# Patient Record
Sex: Female | Born: 1955
Health system: Southern US, Community
[De-identification: ages and names within clinical notes are randomized; demographics above are authoritative.]

## PROBLEM LIST (undated history)

## (undated) DIAGNOSIS — M545 Low back pain, unspecified: Secondary | ICD-10-CM

## (undated) DIAGNOSIS — F419 Anxiety disorder, unspecified: Secondary | ICD-10-CM

## (undated) DIAGNOSIS — R51 Headache: Secondary | ICD-10-CM

## (undated) DIAGNOSIS — E039 Hypothyroidism, unspecified: Secondary | ICD-10-CM

## (undated) DIAGNOSIS — R002 Palpitations: Secondary | ICD-10-CM

## (undated) DIAGNOSIS — E663 Overweight: Secondary | ICD-10-CM

## (undated) DIAGNOSIS — E7800 Pure hypercholesterolemia, unspecified: Secondary | ICD-10-CM

## (undated) DIAGNOSIS — E0590 Thyrotoxicosis, unspecified without thyrotoxic crisis or storm: Secondary | ICD-10-CM

## (undated) DIAGNOSIS — D539 Nutritional anemia, unspecified: Secondary | ICD-10-CM

## (undated) DIAGNOSIS — R911 Solitary pulmonary nodule: Secondary | ICD-10-CM

## (undated) HISTORY — DX: Nutritional anemia, unspecified: D53.9

## (undated) HISTORY — DX: Overweight: E66.3

## (undated) HISTORY — DX: Palpitations: R00.2

## (undated) HISTORY — DX: Pure hypercholesterolemia, unspecified: E78.00

## (undated) HISTORY — DX: Solitary pulmonary nodule: R91.1

## (undated) HISTORY — DX: Headache: R51

## (undated) HISTORY — DX: Hypothyroidism, unspecified: E03.9

## (undated) HISTORY — DX: Thyrotoxicosis, unspecified without thyrotoxic crisis or storm: E05.90

## (undated) HISTORY — DX: Low back pain, unspecified: M54.50

## (undated) HISTORY — DX: Anxiety disorder, unspecified: F41.9

## (undated) HISTORY — DX: Low back pain: M54.5

---
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
## 1999-11-04 ENCOUNTER — Other Ambulatory Visit: Admission: RE | Admit: 1999-11-04 | Discharge: 1999-11-04 | Payer: Self-pay | Admitting: Family Medicine

## 1999-11-29 ENCOUNTER — Encounter: Admission: RE | Admit: 1999-11-29 | Discharge: 2000-02-27 | Payer: Self-pay | Admitting: Family Medicine

## 2001-11-30 ENCOUNTER — Other Ambulatory Visit: Admission: RE | Admit: 2001-11-30 | Discharge: 2001-11-30 | Payer: Self-pay | Admitting: Obstetrics and Gynecology

## 2003-01-27 ENCOUNTER — Other Ambulatory Visit: Admission: RE | Admit: 2003-01-27 | Discharge: 2003-01-27 | Payer: Self-pay | Admitting: Obstetrics and Gynecology

## 2004-02-06 ENCOUNTER — Other Ambulatory Visit: Admission: RE | Admit: 2004-02-06 | Discharge: 2004-02-06 | Payer: Self-pay | Admitting: Obstetrics and Gynecology

## 2004-08-24 ENCOUNTER — Ambulatory Visit: Payer: Self-pay | Admitting: Pulmonary Disease

## 2005-04-08 ENCOUNTER — Ambulatory Visit: Payer: Self-pay | Admitting: Pulmonary Disease

## 2005-04-15 ENCOUNTER — Ambulatory Visit: Payer: Self-pay | Admitting: Pulmonary Disease

## 2005-04-21 ENCOUNTER — Ambulatory Visit: Payer: Self-pay | Admitting: Endocrinology

## 2005-05-02 ENCOUNTER — Encounter (HOSPITAL_COMMUNITY): Admission: RE | Admit: 2005-05-02 | Discharge: 2005-07-26 | Payer: Self-pay | Admitting: Endocrinology

## 2005-07-06 ENCOUNTER — Ambulatory Visit: Payer: Self-pay | Admitting: Endocrinology

## 2005-08-09 ENCOUNTER — Other Ambulatory Visit: Admission: RE | Admit: 2005-08-09 | Discharge: 2005-08-09 | Payer: Self-pay | Admitting: Obstetrics and Gynecology

## 2005-08-11 ENCOUNTER — Ambulatory Visit: Payer: Self-pay | Admitting: Pulmonary Disease

## 2005-10-14 ENCOUNTER — Ambulatory Visit: Payer: Self-pay | Admitting: Endocrinology

## 2006-02-14 ENCOUNTER — Ambulatory Visit: Payer: Self-pay | Admitting: Endocrinology

## 2006-08-21 ENCOUNTER — Ambulatory Visit: Payer: Self-pay | Admitting: Pulmonary Disease

## 2006-08-21 LAB — CONVERTED CEMR LAB
ALT: 21 units/L (ref 0–40)
AST: 25 units/L (ref 0–37)
Albumin: 3.2 g/dL — ABNORMAL LOW (ref 3.5–5.2)
Alkaline Phosphatase: 77 units/L (ref 39–117)
BUN: 8 mg/dL (ref 6–23)
Basophils Absolute: 0.1 10*3/uL (ref 0.0–0.1)
Basophils Relative: 1.3 % — ABNORMAL HIGH (ref 0.0–1.0)
CO2: 27 meq/L (ref 19–32)
Calcium: 9.3 mg/dL (ref 8.4–10.5)
Chloride: 105 meq/L (ref 96–112)
Cholesterol: 157 mg/dL (ref 0–200)
Creatinine, Ser: 0.8 mg/dL (ref 0.4–1.2)
Eosinophils Relative: 1.8 % (ref 0.0–5.0)
Free T4: 1.5 ng/dL (ref 0.6–1.6)
GFR calc Af Amer: 98 mL/min
GFR calc non Af Amer: 81 mL/min
Glucose, Bld: 121 mg/dL — ABNORMAL HIGH (ref 70–99)
HCT: 39.4 % (ref 36.0–46.0)
HDL: 41.8 mg/dL (ref >39.0)
Hemoglobin: 13.5 g/dL (ref 12.0–15.0)
LDL Cholesterol: 87 mg/dL (ref 0–99)
Lymphocytes Relative: 24.7 % (ref 12.0–46.0)
MCHC: 34.2 g/dL (ref 30.0–36.0)
MCV: 80.8 fL (ref 78.0–100.0)
Monocytes Absolute: 0.3 10*3/uL (ref 0.2–0.7)
Monocytes Relative: 5.6 % (ref 3.0–11.0)
Neutro Abs: 4.2 10*3/uL (ref 1.4–7.7)
Neutrophils Relative %: 66.6 % (ref 43.0–77.0)
Platelets: 301 10*3/uL (ref 150–400)
Potassium: 4.9 meq/L (ref 3.5–5.1)
RBC: 4.87 M/uL (ref 3.87–5.11)
RDW: 12.3 % (ref 11.5–14.6)
Sodium: 138 meq/L (ref 135–145)
T3, Free: 6.7 pg/mL — ABNORMAL HIGH (ref 2.3–4.2)
TSH: 0.19 microintl units/mL — ABNORMAL LOW (ref 0.35–5.50)
Total Bilirubin: 0.5 mg/dL (ref 0.3–1.2)
Total CHOL/HDL Ratio: 3.8
Total Protein: 7.1 g/dL (ref 6.0–8.3)
Triglycerides: 142 mg/dL (ref 0–149)
VLDL: 28 mg/dL (ref 0–40)
WBC: 6.2 10*3/uL (ref 4.5–10.5)

## 2007-03-22 ENCOUNTER — Encounter: Payer: Self-pay | Admitting: *Deleted

## 2007-03-23 ENCOUNTER — Ambulatory Visit: Payer: Self-pay | Admitting: Pulmonary Disease

## 2007-10-16 ENCOUNTER — Ambulatory Visit: Payer: Self-pay | Admitting: Endocrinology

## 2007-10-16 LAB — CONVERTED CEMR LAB
Free T4: 2.3 ng/dL — ABNORMAL HIGH (ref 0.6–1.6)
TSH: 0.02 microintl units/mL — ABNORMAL LOW (ref 0.35–5.50)

## 2007-10-29 ENCOUNTER — Encounter: Admission: RE | Admit: 2007-10-29 | Discharge: 2007-10-29 | Payer: Self-pay | Admitting: Endocrinology

## 2007-10-31 HISTORY — PX: OTHER SURGICAL HISTORY: SHX169

## 2007-11-01 ENCOUNTER — Telehealth: Payer: Self-pay | Admitting: Endocrinology

## 2007-11-16 ENCOUNTER — Encounter: Admission: RE | Admit: 2007-11-16 | Discharge: 2007-11-16 | Payer: Self-pay | Admitting: Endocrinology

## 2007-12-11 ENCOUNTER — Telehealth (INDEPENDENT_AMBULATORY_CARE_PROVIDER_SITE_OTHER): Payer: Self-pay | Admitting: *Deleted

## 2007-12-13 ENCOUNTER — Ambulatory Visit: Payer: Self-pay | Admitting: Pulmonary Disease

## 2007-12-13 DIAGNOSIS — R002 Palpitations: Secondary | ICD-10-CM | POA: Insufficient documentation

## 2007-12-13 DIAGNOSIS — F411 Generalized anxiety disorder: Secondary | ICD-10-CM | POA: Insufficient documentation

## 2007-12-13 DIAGNOSIS — M545 Low back pain, unspecified: Secondary | ICD-10-CM | POA: Insufficient documentation

## 2007-12-13 DIAGNOSIS — R51 Headache: Secondary | ICD-10-CM | POA: Insufficient documentation

## 2007-12-13 DIAGNOSIS — R519 Headache, unspecified: Secondary | ICD-10-CM | POA: Insufficient documentation

## 2007-12-13 DIAGNOSIS — E7800 Pure hypercholesterolemia, unspecified: Secondary | ICD-10-CM | POA: Insufficient documentation

## 2007-12-13 DIAGNOSIS — T7840XA Allergy, unspecified, initial encounter: Secondary | ICD-10-CM | POA: Insufficient documentation

## 2007-12-19 ENCOUNTER — Ambulatory Visit: Payer: Self-pay | Admitting: Endocrinology

## 2007-12-19 LAB — CONVERTED CEMR LAB
Free T4: 1.7 ng/dL — ABNORMAL HIGH (ref 0.6–1.6)
TSH: 0.02 microintl units/mL — ABNORMAL LOW (ref 0.35–5.50)

## 2008-03-10 ENCOUNTER — Ambulatory Visit: Payer: Self-pay | Admitting: Endocrinology

## 2008-03-10 DIAGNOSIS — E890 Postprocedural hypothyroidism: Secondary | ICD-10-CM | POA: Insufficient documentation

## 2008-03-10 LAB — CONVERTED CEMR LAB
Free T4: 0.2 ng/dL — ABNORMAL LOW (ref 0.6–1.6)
TSH: 38.6 microintl units/mL — ABNORMAL HIGH (ref 0.35–5.50)

## 2008-04-14 ENCOUNTER — Telehealth (INDEPENDENT_AMBULATORY_CARE_PROVIDER_SITE_OTHER): Payer: Self-pay | Admitting: *Deleted

## 2008-04-15 ENCOUNTER — Ambulatory Visit: Payer: Self-pay | Admitting: Pulmonary Disease

## 2008-04-18 ENCOUNTER — Ambulatory Visit (HOSPITAL_COMMUNITY): Admission: RE | Admit: 2008-04-18 | Discharge: 2008-04-18 | Payer: Self-pay | Admitting: Pulmonary Disease

## 2008-04-21 LAB — CONVERTED CEMR LAB
ALT: 60 units/L — ABNORMAL HIGH (ref 0–35)
AST: 43 units/L — ABNORMAL HIGH (ref 0–37)
Albumin: 3.7 g/dL (ref 3.5–5.2)
Alkaline Phosphatase: 71 units/L (ref 39–117)
Amylase: 43 units/L (ref 27–131)
BUN: 7 mg/dL (ref 6–23)
Basophils Absolute: 0.1 10*3/uL (ref 0.0–0.1)
Basophils Relative: 1.4 % (ref 0.0–3.0)
Bilirubin, Direct: 0.2 mg/dL (ref 0.0–0.3)
CO2: 26 meq/L (ref 19–32)
Calcium: 8.8 mg/dL (ref 8.4–10.5)
Chloride: 112 meq/L (ref 96–112)
Creatinine, Ser: 0.8 mg/dL (ref 0.4–1.2)
Eosinophils Absolute: 0.1 10*3/uL (ref 0.0–0.7)
Eosinophils Relative: 1.8 % (ref 0.0–5.0)
GFR calc Af Amer: 97 mL/min
GFR calc non Af Amer: 80 mL/min
Glucose, Bld: 102 mg/dL — ABNORMAL HIGH (ref 70–99)
HCT: 36.2 % (ref 36.0–46.0)
Hemoglobin: 12.5 g/dL (ref 12.0–15.0)
Lipase: 14 units/L (ref 11.0–59.0)
Lymphocytes Relative: 22 % (ref 12.0–46.0)
MCHC: 34.5 g/dL (ref 30.0–36.0)
MCV: 89.9 fL (ref 78.0–100.0)
Monocytes Absolute: 0.3 10*3/uL (ref 0.1–1.0)
Monocytes Relative: 5.2 % (ref 3.0–12.0)
Neutro Abs: 3.9 10*3/uL (ref 1.4–7.7)
Neutrophils Relative %: 69.6 % (ref 43.0–77.0)
Platelets: 279 10*3/uL (ref 150–400)
Potassium: 4.4 meq/L (ref 3.5–5.1)
RBC: 4.02 M/uL (ref 3.87–5.11)
RDW: 13.4 % (ref 11.5–14.6)
Sed Rate: 49 mm/hr — ABNORMAL HIGH (ref 0–22)
Sodium: 140 meq/L (ref 135–145)
Total Bilirubin: 0.6 mg/dL (ref 0.3–1.2)
Total Protein: 7.6 g/dL (ref 6.0–8.3)
WBC: 5.7 10*3/uL (ref 4.5–10.5)

## 2008-04-22 ENCOUNTER — Encounter: Payer: Self-pay | Admitting: Pulmonary Disease

## 2008-04-23 ENCOUNTER — Telehealth (INDEPENDENT_AMBULATORY_CARE_PROVIDER_SITE_OTHER): Payer: Self-pay | Admitting: *Deleted

## 2008-05-21 ENCOUNTER — Encounter: Payer: Self-pay | Admitting: Pulmonary Disease

## 2008-05-27 ENCOUNTER — Ambulatory Visit: Payer: Self-pay | Admitting: Endocrinology

## 2008-05-27 LAB — CONVERTED CEMR LAB: TSH: 9.66 microintl units/mL — ABNORMAL HIGH (ref 0.35–5.50)

## 2008-07-22 ENCOUNTER — Telehealth (INDEPENDENT_AMBULATORY_CARE_PROVIDER_SITE_OTHER): Payer: Self-pay | Admitting: *Deleted

## 2008-07-23 ENCOUNTER — Ambulatory Visit: Payer: Self-pay | Admitting: Pulmonary Disease

## 2008-08-01 HISTORY — PX: CHOLECYSTECTOMY, LAPAROSCOPIC: SHX56

## 2008-09-19 ENCOUNTER — Ambulatory Visit: Payer: Self-pay | Admitting: Pulmonary Disease

## 2008-09-20 DIAGNOSIS — E0590 Thyrotoxicosis, unspecified without thyrotoxic crisis or storm: Secondary | ICD-10-CM | POA: Insufficient documentation

## 2008-09-22 ENCOUNTER — Telehealth (INDEPENDENT_AMBULATORY_CARE_PROVIDER_SITE_OTHER): Payer: Self-pay | Admitting: *Deleted

## 2008-11-18 LAB — CONVERTED CEMR LAB: Vit D, 25-Hydroxy: 15 ng/mL — ABNORMAL LOW (ref 30–89)

## 2008-12-08 ENCOUNTER — Telehealth: Payer: Self-pay | Admitting: Pulmonary Disease

## 2008-12-19 ENCOUNTER — Telehealth: Payer: Self-pay | Admitting: Pulmonary Disease

## 2009-02-19 ENCOUNTER — Ambulatory Visit: Payer: Self-pay | Admitting: Pulmonary Disease

## 2009-02-19 DIAGNOSIS — D649 Anemia, unspecified: Secondary | ICD-10-CM | POA: Insufficient documentation

## 2009-02-21 DIAGNOSIS — E559 Vitamin D deficiency, unspecified: Secondary | ICD-10-CM | POA: Insufficient documentation

## 2009-02-21 LAB — CONVERTED CEMR LAB
BUN: 10 mg/dL (ref 6–23)
Basophils Absolute: 0.1 10*3/uL (ref 0.0–0.1)
Basophils Relative: 0.9 % (ref 0.0–3.0)
CO2: 30 meq/L (ref 19–32)
Calcium: 8.9 mg/dL (ref 8.4–10.5)
Chloride: 108 meq/L (ref 96–112)
Creatinine, Ser: 0.9 mg/dL (ref 0.4–1.2)
Eosinophils Absolute: 0.1 10*3/uL (ref 0.0–0.7)
Eosinophils Relative: 2.3 % (ref 0.0–5.0)
GFR calc non Af Amer: 69.81 mL/min (ref >60)
Glucose, Bld: 98 mg/dL (ref 70–99)
HCT: 36.4 % (ref 36.0–46.0)
Hemoglobin: 12.4 g/dL (ref 12.0–15.0)
Iron: 54 ug/dL (ref 42–145)
Lymphocytes Relative: 25.4 % (ref 12.0–46.0)
Lymphs Abs: 1.5 10*3/uL (ref 0.7–4.0)
MCHC: 34.1 g/dL (ref 30.0–36.0)
MCV: 88 fL (ref 78.0–100.0)
Monocytes Absolute: 0.3 10*3/uL (ref 0.1–1.0)
Monocytes Relative: 5.2 % (ref 3.0–12.0)
Neutro Abs: 4 10*3/uL (ref 1.4–7.7)
Neutrophils Relative %: 66.2 % (ref 43.0–77.0)
Platelets: 247 10*3/uL (ref 150.0–400.0)
Potassium: 4.4 meq/L (ref 3.5–5.1)
RBC: 4.14 M/uL (ref 3.87–5.11)
RDW: 13.6 % (ref 11.5–14.6)
Saturation Ratios: 10.9 % — ABNORMAL LOW (ref 20.0–50.0)
Sodium: 143 meq/L (ref 135–145)
TSH: 12 microintl units/mL — ABNORMAL HIGH (ref 0.35–5.50)
Transferrin: 353.4 mg/dL (ref 212.0–360.0)
WBC: 6 10*3/uL (ref 4.5–10.5)

## 2009-04-17 ENCOUNTER — Telehealth: Payer: Self-pay | Admitting: Pulmonary Disease

## 2009-04-17 ENCOUNTER — Telehealth (INDEPENDENT_AMBULATORY_CARE_PROVIDER_SITE_OTHER): Payer: Self-pay | Admitting: *Deleted

## 2010-01-13 ENCOUNTER — Telehealth: Payer: Self-pay | Admitting: Pulmonary Disease

## 2010-03-11 ENCOUNTER — Encounter (INDEPENDENT_AMBULATORY_CARE_PROVIDER_SITE_OTHER): Payer: Self-pay | Admitting: *Deleted

## 2010-03-12 ENCOUNTER — Ambulatory Visit: Payer: Self-pay | Admitting: Internal Medicine

## 2010-03-24 ENCOUNTER — Ambulatory Visit: Payer: Self-pay | Admitting: Internal Medicine

## 2010-04-08 ENCOUNTER — Ambulatory Visit: Payer: Self-pay | Admitting: Pulmonary Disease

## 2010-04-08 ENCOUNTER — Encounter: Payer: Self-pay | Admitting: Pulmonary Disease

## 2010-04-11 LAB — CONVERTED CEMR LAB
AST: 23 units/L (ref 0–37)
Albumin: 3.9 g/dL (ref 3.5–5.2)
Alkaline Phosphatase: 87 units/L (ref 39–117)
BUN: 10 mg/dL (ref 6–23)
Basophils Absolute: 0 10*3/uL (ref 0.0–0.1)
Basophils Relative: 0.5 % (ref 0.0–3.0)
Bilirubin, Direct: 0.2 mg/dL (ref 0.0–0.3)
CO2: 29 meq/L (ref 19–32)
Calcium: 9.2 mg/dL (ref 8.4–10.5)
Creatinine, Ser: 0.7 mg/dL (ref 0.4–1.2)
Direct LDL: 134.2 mg/dL
Eosinophils Relative: 4.4 % (ref 0.0–5.0)
GFR calc non Af Amer: 88.5 mL/min (ref >60)
Glucose, Bld: 93 mg/dL (ref 70–99)
HCT: 38.1 % (ref 36.0–46.0)
Hemoglobin: 12.9 g/dL (ref 12.0–15.0)
Leukocytes, UA: NEGATIVE
Lymphs Abs: 1.6 10*3/uL (ref 0.7–4.0)
MCHC: 33.9 g/dL (ref 30.0–36.0)
Monocytes Absolute: 0.3 10*3/uL (ref 0.1–1.0)
Monocytes Relative: 5.6 % (ref 3.0–12.0)
Neutro Abs: 3.2 10*3/uL (ref 1.4–7.7)
Neutrophils Relative %: 59.4 % (ref 43.0–77.0)
Nitrite: NEGATIVE
Platelets: 226 10*3/uL (ref 150.0–400.0)
Potassium: 4.9 meq/L (ref 3.5–5.1)
RBC: 4.2 M/uL (ref 3.87–5.11)
RDW: 13.8 % (ref 11.5–14.6)
Sodium: 141 meq/L (ref 135–145)
Specific Gravity, Urine: <=1.005 (ref 1.000–1.030)
TSH: 20.65 microintl units/mL — ABNORMAL HIGH (ref 0.35–5.50)
Total Protein, Urine: NEGATIVE mg/dL
Total Protein: 7.2 g/dL (ref 6.0–8.3)
Triglycerides: 108 mg/dL (ref 0.0–149.0)
Urobilinogen, UA: 0.2 (ref 0.0–1.0)
VLDL: 21.6 mg/dL (ref 0.0–40.0)
Vit D, 25-Hydroxy: 33 ng/mL (ref 30–89)
WBC: 5.4 10*3/uL (ref 4.5–10.5)
pH: 6.5 (ref 5.0–8.0)

## 2010-08-21 ENCOUNTER — Encounter: Payer: Self-pay | Admitting: Endocrinology

## 2010-08-29 LAB — CONVERTED CEMR LAB
ALT: 19 units/L (ref 0–35)
AST: 20 units/L (ref 0–37)
Albumin: 3.6 g/dL (ref 3.5–5.2)
Alkaline Phosphatase: 82 units/L (ref 39–117)
BUN: 11 mg/dL (ref 6–23)
Basophils Absolute: 0 10*3/uL (ref 0.0–0.1)
Basophils Relative: 0.7 % (ref 0.0–3.0)
Bilirubin Urine: NEGATIVE
Bilirubin, Direct: 0.1 mg/dL (ref 0.0–0.3)
CO2: 28 meq/L (ref 19–32)
Calcium: 9.2 mg/dL (ref 8.4–10.5)
Chloride: 104 meq/L (ref 96–112)
Cholesterol: 185 mg/dL (ref 0–200)
Creatinine, Ser: 0.8 mg/dL (ref 0.4–1.2)
Crystals: NEGATIVE
Eosinophils Absolute: 0.1 10*3/uL (ref 0.0–0.7)
Eosinophils Relative: 1.3 % (ref 0.0–5.0)
GFR calc Af Amer: 97 mL/min
GFR calc non Af Amer: 80 mL/min
Glucose, Bld: 105 mg/dL — ABNORMAL HIGH (ref 70–99)
HCT: 35.2 % — ABNORMAL LOW (ref 36.0–46.0)
HDL: 54.4 mg/dL (ref >39.0)
Hemoglobin: 11.9 g/dL — ABNORMAL LOW (ref 12.0–15.0)
Hgb A1c MFr Bld: 6 % (ref 4.6–6.0)
Ketones, ur: NEGATIVE mg/dL
LDL Cholesterol: 108 mg/dL — ABNORMAL HIGH (ref 0–99)
Leukocytes, UA: NEGATIVE
Lymphocytes Relative: 21 % (ref 12.0–46.0)
MCHC: 33.7 g/dL (ref 30.0–36.0)
MCV: 85.2 fL (ref 78.0–100.0)
Monocytes Absolute: 0.3 10*3/uL (ref 0.1–1.0)
Monocytes Relative: 4.2 % (ref 3.0–12.0)
Mucus, UA: NEGATIVE
Neutro Abs: 5 10*3/uL (ref 1.4–7.7)
Neutrophils Relative %: 72.8 % (ref 43.0–77.0)
Nitrite: NEGATIVE
Platelets: 252 10*3/uL (ref 150–400)
Potassium: 4.3 meq/L (ref 3.5–5.1)
RBC: 4.13 M/uL (ref 3.87–5.11)
RDW: 13 % (ref 11.5–14.6)
Sodium: 139 meq/L (ref 135–145)
Specific Gravity, Urine: 1.02 (ref 1.000–1.03)
TSH: 2.38 microintl units/mL (ref 0.35–5.50)
Total Bilirubin: 0.6 mg/dL (ref 0.3–1.2)
Total CHOL/HDL Ratio: 3.4
Total Protein, Urine: NEGATIVE mg/dL
Total Protein: 7.1 g/dL (ref 6.0–8.3)
Triglycerides: 115 mg/dL (ref 0–149)
Urine Glucose: NEGATIVE mg/dL
Urobilinogen, UA: 0.2 (ref 0.0–1.0)
VLDL: 23 mg/dL (ref 0–40)
WBC, UA: NONE SEEN cells/hpf
WBC: 6.8 10*3/uL (ref 4.5–10.5)
pH: 6 (ref 5.0–8.0)

## 2010-08-31 NOTE — Procedures (Signed)
Summary: Colonoscopy  Patient: Debra Watkins Note: All result statuses are Final unless otherwise noted.  Tests: (1) Colonoscopy (COL)   COL Colonoscopy           DONE     [facility]     [address]     [place], [place]  [address]           COLONOSCOPY PROCEDURE REPORT           PATIENT:  Debra, Watkins  MR#:  [id number]     BIRTHDATE:  29-Sep-1955, 54 yrs. old  GENDER:  female     ENDOSCOPIST:  Wilhemina Bonito. Eda Keys, MD     REF. BY:  Alroy Dust, M.D.     PROCEDURE DATE:  03/24/2010     PROCEDURE:  Average-risk screening colonoscopy     G0121     ASA CLASS:  Class II     INDICATIONS:  Routine Risk Screening     MEDICATIONS:   Fentanyl 50 mcg IV, Versed 7 mg IV, Benadryl 25 mg     IV           DESCRIPTION OF PROCEDURE:   After the risks benefits and     alternatives of the procedure were thoroughly explained, informed     consent was obtained.  Digital rectal exam was performed and     revealed no abnormalities.   The [id number] endoscope was     introduced through the anus and advanced to the cecum, which was     identified by both the appendix and ileocecal valve, without     limitations.Time to cecum = 2:29 min.  The quality of the prep was     excellent, using MoviPrep.  The instrument was then slowly     withdrawn (time = 10:33 min) as the colon was fully examined.     <<PROCEDUREIMAGES>>           FINDINGS:  A normal appearing cecum, ileocecal valve, and     appendiceal orifice were identified. The ascending, hepatic     flexure, transverse, splenic flexure, descending, sigmoid colon,     and rectum appeared unremarkable.  No polyps or cancers were seen.     Retroflexed views in the rectum revealed no abnormalities.    The     scope was then withdrawn from the patient and the procedure     completed.           COMPLICATIONS:  None           ENDOSCOPIC IMPRESSION:     1) Normal colon     2) No polyps or cancers           RECOMMENDATIONS:     1)  Continue current colorectal screening recommendations for     "routine risk" patients with a repeat colonoscopy in 10 years.           ______________________________     Wilhemina Bonito. Eda Keys, MD           CC:  Michele Mcalpine, MD; The Patient           n.     eSIGNED:   Wilhemina Bonito. Eda Keys at 03/24/2010 10:00 AM           Demetria Pore, [id number]  Note: An exclamation mark (!) indicates a result that was not dispersed into the flowsheet. Document Creation Date: 03/24/2010 10:01 AM _______________________________________________________________________  (1) Order result status: Final  Collection or observation date-time: 03/24/2010 09:54 Requested date-time:  Receipt date-time:  Reported date-time:  Referring Physician:   Ordering Physician: Fransico Setters [phone]) Specimen Source:  Source: Launa Grill Order Number: [id number] Lab site:   Appended Document: Colonoscopy    Clinical Lists Changes  Observations: Added new observation of COLONNXTDUE: 03/2020 (03/24/2010 13:49)

## 2010-08-31 NOTE — Letter (Signed)
Summary: [facility] Instructions  [facility]  [address] [place], [place] [address]   Phone: [phone]  Fax: [phone]       Debra Watkins    [birth year]    MRN: [id number]        Procedure Day Dorna Bloom:  [facility]  03/24/10     Arrival Time:  8:00AM     Procedure Time:  9:00AM     Location of Procedure:                    Juliann Pares _  [facility] (4th Floor)                      PREPARATION FOR COLONOSCOPY WITH MOVIPREP   Starting 5 days prior to your procedure 03/19/10 do not eat nuts, seeds, popcorn, corn, beans, peas,  salads, or any raw vegetables.  Do not take any fiber supplements (e.g. Metamucil, Citrucel, and Benefiber).  THE DAY BEFORE YOUR PROCEDURE         DATE: 03/23/10  DAY: TUESDAY  1.  Drink clear liquids the entire day-NO SOLID FOOD  2.  Do not drink anything colored red or purple.  Avoid juices with pulp.  No orange juice.  3.  Drink at least 64 oz. (8 glasses) of fluid/clear liquids during the day to prevent dehydration and help the prep work efficiently.  CLEAR LIQUIDS INCLUDE: Water Jello Ice Popsicles Tea (sugar ok, no milk/cream) Powdered fruit flavored drinks Coffee (sugar ok, no milk/cream) Gatorade Juice: apple, white grape, white cranberry  Lemonade Clear bullion, consomm, broth Carbonated beverages (any kind) Strained chicken noodle soup Hard Candy                             4.  In the morning, mix first dose of MoviPrep solution:    Empty 1 Pouch A and 1 Pouch B into the disposable container    Add lukewarm drinking water to the top line of the container. Mix to dissolve    Refrigerate (mixed solution should be used within 24 hrs)  5.  Begin drinking the prep at 5:00 p.m. The MoviPrep container is divided by 4 marks.   Every 15 minutes drink the solution down to the next mark (approximately 8 oz) until the full liter is complete.   6.  Follow completed prep with 16 oz of clear liquid of your choice  (Nothing red or purple).  Continue to drink clear liquids until bedtime.  7.  Before going to bed, mix second dose of MoviPrep solution:    Empty 1 Pouch A and 1 Pouch B into the disposable container    Add lukewarm drinking water to the top line of the container. Mix to dissolve    Refrigerate  THE DAY OF YOUR PROCEDURE      DATE: 03/24/10  DAY: WEDNESDAY  Beginning at 4:00AM (5 hours before procedure):         1. Every 15 minutes, drink the solution down to the next mark (approx 8 oz) until the full liter is complete.  2. Follow completed prep with 16 oz. of clear liquid of your choice.    3. You may drink clear liquids until 7:00AM (2 HOURS BEFORE PROCEDURE).   MEDICATION INSTRUCTIONS  Unless otherwise instructed, you should take regular prescription medications with a small sip of water   as early as possible the morning of your  procedure.         OTHER INSTRUCTIONS  You will need a responsible adult at least 54 years of age to accompany you and drive you home.   This person must remain in the waiting room during your procedure.  Wear loose fitting clothing that is easily removed.  Leave jewelry and other valuables at home.  However, you may wish to bring a book to read or  an iPod/MP3 player to listen to music as you wait for your procedure to start.  Remove all body piercing jewelry and leave at home.  Total time from sign-in until discharge is approximately 2-3 hours.  You should go home directly after your procedure and rest.  You can resume normal activities the  day after your procedure.  The day of your procedure you should not:   Drive   Make legal decisions   Operate machinery   Drink alcohol   Return to work  You will receive specific instructions about eating, activities and medications before you leave.    The above instructions have been reviewed and explained to me by   Wyona Almas RN  August 54, 2011 4:16 PM     I fully  understand and can verbalize these instructions _____________________________ Date _________

## 2010-08-31 NOTE — Miscellaneous (Signed)
Summary: [facility] Previsit/prep  Clinical Lists Changes  Medications: Added new medication of MOVIPREP 100 GM  SOLR (PEG-KCL-NACL-NASULF-NA ASC-C) As per prep instructions. - Signed Rx of MOVIPREP 100 GM  SOLR (PEG-KCL-NACL-NASULF-NA ASC-C) As per prep instructions.;  #1 x 0;  Signed;  Entered by: Wyona Almas RN;  Authorized by: Hilarie Fredrickson MD;  Method used: Electronically to [facility] [address]. # [id number]*, [address], [place], [place]  [address], Ph: [phone], Fax: [phone] Observations: Added new observation of ALLERGY REV: Done (03/12/2010 15:45)    Prescriptions: MOVIPREP 100 GM  SOLR (PEG-KCL-NACL-NASULF-NA ASC-C) As per prep instructions.  #1 x 0   Entered by:   Wyona Almas RN   Authorized by:   Hilarie Fredrickson MD   Signed by:   Wyona Almas RN on 03/12/2010   Method used:   Electronically to        [facility] [address] # [phone]* (retail)       [address]       [place], [place]  [address]       Ph: [phone]       Fax: [phone]   RxID:   [id number]

## 2010-08-31 NOTE — Assessment & Plan Note (Signed)
Summary: cpx/jd   CC:  Yearly ROV & CPX....  History of Present Illness: 55 y/o WF here for a follow up visit... she has multiple medical problems as noted below...     ~  Followed for general medical purposes & she is the daughter of Emelynn Rance, a long time pt of mine...  she had a CPX 2/10 w/ routine screening colonoscopy rec to pt- not yet scheduled due to $$$.  ~  Hx hyperthyroidism and received I- 131 therapy (17.82mC) on 11/16/07... f/u labs showed the developement of post-RAI hypothyroidism and Synthroid started 8/09 & titrated up to 195mcg/d...   ~  February 19, 2009:  due for f/u CXR secondary to film 2/10 showing ?4mm left lung nodule, radiology rec CT Chest & we opted for 63mo f/u film... asymptomatic, never smoker, no hx cardiopulm disease... unfortunately she has gained 8# to 213# despite diet efforts & we discussed this again... her Hg was 11.9 w/MCV= 85 last visit & she needs the colon check- f/u labs today... finally she Vit D level= 15 and started on 50000 u weekly 2/10...   ~  April 08, 2010:  Yearly ROV & CPX-  she had colonoscopy 8/11 by DrPerry> neg, norm colon, no lesions seen... generally stable- notes some mild arthritic symptoms & Rx w/ NSAID OTC... denies palpit, CP, SOB, etc;  Chol only fair on diet alone w/ weight up 15# to 228# today;  TSH noted to still be elevated & she claims taking Synthroid125 daily (I called [facility]- refiiled 03/07/10 & 01/20/10, therefore NOT taking it every day)... Vit D improved but still lower end normal at 33 on 50K per week- keep same.    Current Problem List:  ALLERGY (ICD-995.3) - uses OTC meds as needed, and PROAIR HFA Prn...  ? of PULMONARY NODULE (ICD-518.89)  ~  Routine CXR 2/10 CPX showed ?4mm Left lung nodule... we opted for f/u film rather than CTChest.  ~  CXR 7/10 showed clear lungs- no nodule seen, sl hyperareated, mild cardiomeg, osteophytes.  ~  CXR 9/11 showed borderline heart size, clear lungs, DJD  spine...  PALPITATIONS (ICD-785.1) - related to her prev hyperthyroidism... she remains on LOPRESSOR 25mg Bid.  HYPERCHOLESTEROLEMIA (ICD-272.0) - on diet alone...  ~  FLP 1/07 showed TChol 224, TG 71, HDL 59, LDL 157... diet therapy discussed...  ~  FLP 1/08 showed TChol 157, TG 142, HDL 42, LDL 87... continue diet efforts...  ~  FLP 2/10 showed TChol 185, TG 115, HDL 54, LDL 108  ~  FLP 9/11 showed TChol 203, TG 108, HDL 53, LDL 134... she does not want meds.  OVERWEIGHT (ICD-278.02) - weight 197-234# over the last few years...   ~  weight 2/10 = 205#... diet + exercise discussed...  ~  weight 7/10 = 213#  ~  weight 9/11 = 228#... we reviewed diet + exercise needed.  HYPERTHYROIDISM (ICD-242.90) - hx hyperthyroidism x yrs (alternately felt to be c/w Grave's disease vs toxic multinodular goiter) w/ eval and Rx from DrKrege, DrClark, and now DrEllison... she was prev noncompliant w/ medical follow up and her antithyroid drugs (eg. Tapazole)... DrEllison Rx w/ I- 131 = 17.5 mCi given 11/16/07... she denies symptoms of palpit, racing, jittery, etc...   ~  labs show TSH went from 0.02 in May09 to 38 in Aug09 and SYNTHROID 111mcg/d started for Post-Radiation Hypothyroidism...  ~  labs 10/09 showed TSH = 9.66 & Synthroid incr to 183mcg/d...  ~  labs 2/10 showed TSH=  2.38... continue SYNTHROID 148mcg/d.  ~  labs 7/10 showed TSH= 12.00... reminded to take med daily.  ~  labs 9/11 showed TSH= 20.64... call to Pharm revealed not taking meds regularly (filled #30- 8/7 & 6/22)  Hx of BACK PAIN, LUMBAR (ICD-724.2) - on FLEXERIL 10mg  Tid, & uses Vicodin as needed.  VITAMIN D DEFICIENCY (ICD-268.9)  ~  labs 2/10 showed Vit D level = 15... rec to start Vit D 50K weekly.  ~  labs 9/11 showed Vit D level = 33... on Vit D 50K weekly.  ANXIETY (ICD-300.00) - uses Alprazolam 0.5mg  as needed, incr stress w/ father ill...  UNSPECIFIED ANEMIA (ICD-285.9) - she hasn't yet had her routine screening colonoscopy>  reminded to sched.  ~  labs 2/10 showed Hg= 11.9, MCV= 85... rec> MVI w/ Fe, & sched colon check...  ~  labs 7/10 showed Hg= 12.4, MCV= 88, Fe= 54 (sat=11%)... rec> needs colon!  ~  labs 9/11 showed Hg= 12.9   Preventive Screening-Counseling & Management  Alcohol-Tobacco     Smoking Status: never  Allergies: 1)  ! Pcn 2)  ! * Tetanus  Comments:  Nurse/Medical Assistant: The patient's medications and allergies were reviewed with the patient and were updated in the Medication and Allergy Lists.  Past History:  Past Medical History: ALLERGY (ICD-995.3) ? of PULMONARY NODULE (ICD-518.89) PALPITATIONS (ICD-785.1) HYPERCHOLESTEROLEMIA (ICD-272.0) Hx of HYPERTHYROIDISM (ICD-242.90) HYPOTHYROIDISM, POST-RADIATION (ICD-244.1) OVERWEIGHT (ICD-278.02) Hx of BACK PAIN, LUMBAR (ICD-724.2) VITAMIN D DEFICIENCY (ICD-268.9) HEADACHE (ICD-784.0) ANXIETY (ICD-300.00) UNSPECIFIED ANEMIA (ICD-285.9)  Past Surgical History: S/P T & A in 1963 S/P I- 131 therapy for hyperthyroidism 4/09  Family History: Reviewed history from 02/19/2009 and no changes required. pt was adopted  Social History: Reviewed history from 02/19/2009 and no changes required. never smoked no children social alcohol use drinks tea daily walks as exercise sometimes  Review of Systems       The patient complains of fatigue, muscle cramps, anxiety, and hay fever.  The patient denies fever, chills, sweats, anorexia, weakness, malaise, weight loss, sleep disorder, blurring, diplopia, eye irritation, eye discharge, vision loss, eye pain, photophobia, earache, ear discharge, tinnitus, decreased hearing, nasal congestion, nosebleeds, sore throat, hoarseness, chest pain, palpitations, syncope, dyspnea on exertion, orthopnea, PND, peripheral edema, cough, dyspnea at rest, excessive sputum, hemoptysis, wheezing, pleurisy, nausea, vomiting, diarrhea, constipation, change in bowel habits, abdominal pain, melena, hematochezia,  jaundice, gas/bloating, indigestion/heartburn, dysphagia, odynophagia, dysuria, hematuria, urinary frequency, urinary hesitancy, nocturia, incontinence, back pain, joint pain, joint swelling, muscle weakness, stiffness, arthritis, sciatica, restless legs, leg pain at night, leg pain with exertion, rash, itching, dryness, suspicious lesions, paralysis, paresthesias, seizures, tremors, vertigo, transient blindness, frequent falls, frequent headaches, difficulty walking, depression, memory loss, confusion, cold intolerance, heat intolerance, polydipsia, polyphagia, polyuria, unusual weight change, abnormal bruising, bleeding, enlarged lymph nodes, urticaria, allergic rash, and recurrent infections.    Vital Signs:  Patient profile:   56 year old female Height:      64 inches Weight:      227.38 pounds BMI:     39.17 O2 Sat:      98 % on Room air Temp:     96.8 degrees F oral Pulse rate:   73 / minute BP sitting:   126 / 84  (left arm) Cuff size:   regular  Vitals Entered By: Randell Loop CMA (April 08, 2010 11:09 AM)  O2 Sat at Rest %:  98 O2 Flow:  Room air CC: Yearly ROV & CPX... Is Patient Diabetic? No  Pain Assessment Patient in pain? yes      Onset of pain  sometimes in her left elbow x 1 year Comments meds updated today with pt   Physical Exam  Additional Exam:  WD, Overweight, 55 y/o WF in NAD... GENERAL:  Alert & oriented; pleasant & cooperative... HEENT:  [place]/AT, EOM-wnl, PERRLA, EACs-clear, TMs-wnl, NOSE-clear, THROAT-clear & wnl. NECK:  Supple w/ fairROM; no JVD; normal carotid impulses w/o bruits; palp thyroid w/o nodules felt; no lymphadenopathy. CHEST:  Clear to P & A; without wheezes/ rales/ or rhonchi. HEART:  Regular rhythm, without murmurs/ rubs/ or gallops heard... ABDOMEN:  Soft & nontender, normal bowel sounds; no organomegaly or masses detected. EXT: without deformities, mild arthritic changes; no varicose veins/ +venous insuffic/ no edema. NEURO:  CN's  intact; no focal neuro deficits... DERM:  No lesions noted; no rash etc...    CXR  Procedure date:  04/08/2010  Findings:      CHEST - 2 VIEW Comparison: 02/19/2009 and 08/21/2006   Findings: Heart size is mildly enlarged and both hilar regions are prominent but stable since 2008. Some redistribution of pulmonary vascularity is seen and may indicate some underlying pulmonary venous hypertension.  The lung fields are clear with no signs of focal infiltrate or congestive failure.  No pleural fluid or peribronchial cuffing is noted.   Bony structures demonstrate degenerative changes of the lower thoracic spine and this finding is unchanged.   IMPRESSION: Stable mild cardiac enlargement with pulmonary hilar prominence and vascular redistribution.  No new focal or acute abnormality suggested.   Read By:  Bertha Stakes,  M.D.   EKG  Procedure date:  04/08/2010  Findings:      Normal sinus rhythm with rate of:  68/min... Tracing is WNL, NAD...  SN   MISC. Report  Procedure date:  04/08/2010  Findings:      BMP (METABOL)   Sodium                    141 mEq/L                   135-145   Potassium                 4.9 mEq/L                   3.5-5.1   Chloride                  104 mEq/L                   96-112   Carbon Dioxide            29 mEq/L                    19-32   Glucose                   93 mg/dL                    16-10   BUN                       10 mg/dL                    9-60   Creatinine                0.7 mg/dL  0.4-1.2   Calcium                   9.2 mg/dL                   1.6-10.9   GFR                       88.50 mL/min                >60  Hepatic/Liver Function Panel (HEPATIC)   Total Bilirubin           0.8 mg/dL                   6.0-4.5   Direct Bilirubin          0.2 mg/dL                   4.0-9.8   Alkaline Phosphatase      87 U/L                      39-117   AST                       23 U/L                       0-37   ALT                       18 U/L                      0-35   Total Protein             7.2 g/dL                    1.1-9.1   Albumin                   3.9 g/dL                    4.7-8.2  CBC Platelet w/Diff (CBCD)   White Cell Count          5.4 K/uL                    4.5-10.5   Red Cell Count            4.20 Mil/uL                 3.87-5.11   Hemoglobin                12.9 g/dL                   95.6-21.3   Hematocrit                38.1 %                      36.0-46.0   MCV                       90.6 fl                     78.0-100.0   Platelet Count            226.0 K/uL  150.0-400.0   Neutrophil %              59.4 %                      43.0-77.0   Lymphocyte %              30.1 %                      12.0-46.0   Monocyte %                5.6 %                       3.0-12.0   Eosinophils%              4.4 %                       0.0-5.0   Basophils %               0.5 %                       0.0-3.0  Comments:      Lipid Panel (LIPID)   Cholesterol          [H]  203 mg/dL                   1-610   Triglycerides             108.0 mg/dL                 9.6-045.4   HDL                       09.81 mg/dL                 >19.14    Cholesterol LDL - Direct                             134.2 mg/dL       TSH (TSH)   FastTSH              [H]  20.65 uIU/mL                0.35-5.50  UDip w/Micro (URINE)   Color                     LT. YELLOW   Clarity                   CLEAR                       Clear   Specific Gravity          <=1.005                     1.000 - 1.030   Urine Ph                  6.5                         5.0-8.0   Protein                   NEGATIVE  Negative   Urine Glucose             NEGATIVE                    Negative   Ketones                   NEGATIVE                    Negative   Urine Bilirubin           NEGATIVE                    Negative   Blood                     TRACE-LYSED                 Negative    Urobilinogen              0.2                         0.0 - 1.0   Leukocyte Esterace        NEGATIVE                    Negative   Nitrite                   NEGATIVE                    Negative   Urine WBC                 0-2/hpf                     0-2/hpf   Urine Epith               Few(5-10/hpf)               Rare(0-4/hpf)   Urine Bacteria            Rare(<10/hpf)               None   Impression & Recommendations:  Problem # 1:  PHYSICAL EXAMINATION (ICD-V70.0)  Orders: EKG w/ Interpretation (93000) T-2 View CXR (71020TC) TLB-BMP (Basic Metabolic Panel-BMET) (80048-METABOL) TLB-Hepatic/Liver Function Pnl (80076-HEPATIC) TLB-CBC Platelet - w/Differential (85025-CBCD) TLB-Lipid Panel (80061-LIPID) TLB-TSH (Thyroid Stimulating Hormone) (84443-TSH) TLB-Udip w/ Micro (81001-URINE) T-Vitamin D (25-Hydroxy) (16[address]-60454)  Problem # 2:  PALPITATIONS (ICD-785.1) CXR clear & EKG is essent WNL.Marland Kitchen. she knows to elim caffeine, etc... Her updated medication list for this problem includes:    Metoprolol Tartrate 25 Mg Tabs (Metoprolol tartrate) .Marland Kitchen... Take 1 tab by mouth two times a day...  Problem # 3:  HYPERCHOLESTEROLEMIA (ICD-272.0) Still not at goal... she does not want med rx therefore needs better diet, get weight down if she has any poss of control...  Problem # 4:  HYPOTHYROIDISM, POST-RADIATION (ICD-244.1) She has elev TSH related to not taking the Synthroid regularly... reminded to take this important med every day!!! Her updated medication list for this problem includes:    Levothyroxine Sodium 125 Mcg Tabs (Levothyroxine sodium) .Marland Kitchen... Take 1 tab by mouth once daily....  Problem # 5:  OVERWEIGHT (ICD-278.02) Diet + exercise are the keys...  Problem # 6:  Hx of BACK PAIN, LUMBAR (ICD-724.2) Stable on these meds... Her updated medication list for  this problem includes:    Flexeril 10 Mg Tabs (Cyclobenzaprine hcl) .Marland Kitchen... Take one tablet by mouth three times a day as needed for  muscle spasms    Vicodin 5-500 Mg Tabs (Hydrocodone-acetaminophen) .Marland Kitchen... Take 1 tab by mouth q 6 h as needed for pain...  Problem # 7:  VITAMIN D DEFICIENCY (ICD-268.9) She will continue on Vit D 50K weekly...  Problem # 8:  OTHER MEDICAL PROBLEMS AS NOTED>>>  Complete Medication List: 1)  Proair Hfa 108 (90 Base) Mcg/act Aers (Albuterol sulfate) .... Inhale 1-2 sprays every 6 h as needed... 2)  Metoprolol Tartrate 25 Mg Tabs (Metoprolol tartrate) .... Take 1 tab by mouth two times a day... 3)  Levothyroxine Sodium 125 Mcg Tabs (Levothyroxine sodium) .... Take 1 tab by mouth once daily.... 4)  Ortho-novum  .... Once daily 5)  Flexeril 10 Mg Tabs (Cyclobenzaprine hcl) .... Take one tablet by mouth three times a day as needed for muscle spasms 6)  Vicodin 5-500 Mg Tabs (Hydrocodone-acetaminophen) .... Take 1 tab by mouth q 6 h as needed for pain.Marland KitchenMarland Kitchen 7)  Midrin 325-65-100 Mg Caps (Apap-isometheptene-dichloral) .... Take one capsule by mouth every 4 hours as needed for headache 8)  Alprazolam 0.5 Mg Tabs (Alprazolam) .... Take 1/2 to 1 tab by mouth three times a day as needed for nerves.Marland KitchenMarland Kitchen 9)  Multivitamins Tabs (Multiple vitamin) .... Take 1 tablet by mouth once a day 10)  Vitamin D 54098 Unit Caps (Ergocalciferol) .... Take one capsule by mouth once weekly  Patient Instructions: 1)  Today we updated your med list- see below.... 2)  We refilled your meds as requested... 3)  Today we did your follow up CXR, EKG, & fasting blood work... 4)  please call the "phone tree" in a few days for your lab results.Marland KitchenMarland Kitchen 5)  Let's get on track w/ our diet & exercise program... 6)  Be sure to get the 2011 Flu vaccine at work... 7)  Call for any problems... 8)  Please schedule a follow-up appointment in 1 year, sooner as needed. Prescriptions: FLEXERIL 10 MG TABS (CYCLOBENZAPRINE HCL) take one tablet by mouth three times a day as needed for muscle spasms  #100 x 5   Entered and Authorized by:   Michele Mcalpine  MD   Signed by:   Michele Mcalpine MD on 04/08/2010   Method used:   Print then Give to Patient   RxID:   [id number] ALPRAZOLAM 0.5 MG  TABS (ALPRAZOLAM) take 1/2 to 1 tab by mouth three times a day as needed for nerves...  #100 x 5   Entered and Authorized by:   Michele Mcalpine MD   Signed by:   Michele Mcalpine MD on 04/08/2010   Method used:   Print then Give to Patient   RxID:   [id number] VICODIN 5-500 MG  TABS (HYDROCODONE-ACETAMINOPHEN) take 1 tab by mouth Q 6 H as needed for pain...  #100 x 5   Entered and Authorized by:   Michele Mcalpine MD   Signed by:   Michele Mcalpine MD on 04/08/2010   Method used:   Print then Give to Patient   RxID:   [id number] LEVOTHYROXINE SODIUM 125 MCG TABS (LEVOTHYROXINE SODIUM) take 1 tab by mouth once daily....  #30 x 12   Entered and Authorized by:   Michele Mcalpine MD   Signed by:   Michele Mcalpine MD on 04/08/2010   Method used:   Print then Give  to Patient   RxID:   [id number] METOPROLOL TARTRATE 25 MG TABS (METOPROLOL TARTRATE) take 1 tab by mouth two times a day...  #60 x 12   Entered and Authorized by:   Michele Mcalpine MD   Signed by:   Michele Mcalpine MD on 04/08/2010   Method used:   Print then Give to Patient   RxID:   [id number] PROAIR HFA 108 (90 BASE) MCG/ACT AERS (ALBUTEROL SULFATE) inhale 1-2 sprays every 6 H as needed...  #1 x 12   Entered and Authorized by:   Michele Mcalpine MD   Signed by:   Michele Mcalpine MD on 04/08/2010   Method used:   Print then Give to Patient   RxID:   [id number]    Immunization History:  Influenza Immunization History:    Influenza:  historical (05/21/2009)

## 2010-08-31 NOTE — Progress Notes (Signed)
Summary: refills  Phone Note Call from Patient Call back at [facility] Phone [phone] Call back at Work Phone [phone]   Caller: Patient Call For: Dain Laseter Reason for Call: Refill Medication Summary of Call: Refills for: metoprolol tartrate 25mg , levothyroxine ., pt has an appt on 9/8.//[facility] Initial call taken by: Darletta Moll,  January 13, 2010 11:27 AM  Follow-up for Phone Call        refill sent. pt aware. Carron Curie CMA  January 13, 2010 11:45 AM     Prescriptions: LEVOTHYROXINE SODIUM 125 MCG TABS (LEVOTHYROXINE SODIUM) take 1 tab by mouth once daily....  #30 x 1   Entered by:   Carron Curie CMA   Authorized by:   Michele Mcalpine MD   Signed by:   Carron Curie CMA on 01/13/2010   Method used:   Electronically to        [facility] # [phone]* (retail)       [address]       [place], [place]  [address]       Ph: [phone]       Fax: [phone]   RxID:   [id number] METOPROLOL TARTRATE 25 MG TABS (METOPROLOL TARTRATE) take 1 tab by mouth two times a day...  #60 x 1   Entered by:   Carron Curie CMA   Authorized by:   Michele Mcalpine MD   Signed by:   Carron Curie CMA on 01/13/2010   Method used:   Electronically to        [facility] # [phone]* (retail)       [address]       [place], [place]  [address]       Ph: [phone]       Fax: [phone]   RxID:   [id number]

## 2011-05-04 ENCOUNTER — Ambulatory Visit (INDEPENDENT_AMBULATORY_CARE_PROVIDER_SITE_OTHER): Payer: BC Managed Care – PPO | Admitting: Pulmonary Disease

## 2011-05-04 ENCOUNTER — Ambulatory Visit (INDEPENDENT_AMBULATORY_CARE_PROVIDER_SITE_OTHER)
Admission: RE | Admit: 2011-05-04 | Discharge: 2011-05-04 | Disposition: A | Discharge status: Home / Self Care | Payer: BC Managed Care – PPO | Source: Ambulatory Visit | Attending: Pulmonary Disease | Admitting: Pulmonary Disease

## 2011-05-04 ENCOUNTER — Encounter: Payer: Self-pay | Admitting: Pulmonary Disease

## 2011-05-04 ENCOUNTER — Other Ambulatory Visit (INDEPENDENT_AMBULATORY_CARE_PROVIDER_SITE_OTHER): Payer: BC Managed Care – PPO

## 2011-05-04 VITALS — BP 128/86 | HR 70 | Temp 97.0°F | Ht 64.0 in | Wt 232.0 lb

## 2011-05-04 DIAGNOSIS — E663 Overweight: Secondary | ICD-10-CM

## 2011-05-04 DIAGNOSIS — R002 Palpitations: Secondary | ICD-10-CM

## 2011-05-04 DIAGNOSIS — Z0000 Encounter for general adult medical examination without abnormal findings: Secondary | ICD-10-CM

## 2011-05-04 DIAGNOSIS — M545 Low back pain, unspecified: Secondary | ICD-10-CM

## 2011-05-04 DIAGNOSIS — E890 Postprocedural hypothyroidism: Secondary | ICD-10-CM

## 2011-05-04 DIAGNOSIS — F411 Generalized anxiety disorder: Secondary | ICD-10-CM

## 2011-05-04 DIAGNOSIS — E559 Vitamin D deficiency, unspecified: Secondary | ICD-10-CM

## 2011-05-04 DIAGNOSIS — Z23 Encounter for immunization: Secondary | ICD-10-CM

## 2011-05-04 DIAGNOSIS — D649 Anemia, unspecified: Secondary | ICD-10-CM

## 2011-05-04 DIAGNOSIS — E7800 Pure hypercholesterolemia, unspecified: Secondary | ICD-10-CM

## 2011-05-04 LAB — LIPID PANEL
Cholesterol: 181 mg/dL (ref 0–200)
HDL: 57 mg/dL (ref >39.00)
LDL Cholesterol: 103 mg/dL — ABNORMAL HIGH (ref 0–99)
Triglycerides: 106 mg/dL (ref 0.0–149.0)
VLDL: 21.2 mg/dL (ref 0.0–40.0)

## 2011-05-04 LAB — BASIC METABOLIC PANEL
BUN: 7 mg/dL (ref 6–23)
Calcium: 9 mg/dL (ref 8.4–10.5)
Creatinine, Ser: 0.8 mg/dL (ref 0.4–1.2)
GFR: 78.18 mL/min (ref >60.00)
Glucose, Bld: 111 mg/dL — ABNORMAL HIGH (ref 70–99)
Potassium: 4.3 mEq/L (ref 3.5–5.1)

## 2011-05-04 LAB — CBC WITH DIFFERENTIAL/PLATELET
Basophils Relative: 0.5 % (ref 0.0–3.0)
Eosinophils Relative: 3.6 % (ref 0.0–5.0)
HCT: 37.1 % (ref 36.0–46.0)
Lymphs Abs: 1.5 10*3/uL (ref 0.7–4.0)
MCV: 88.1 fl (ref 78.0–100.0)
Monocytes Absolute: 0.3 10*3/uL (ref 0.1–1.0)
Monocytes Relative: 5.7 % (ref 3.0–12.0)
Platelets: 252 10*3/uL (ref 150.0–400.0)
RBC: 4.22 Mil/uL (ref 3.87–5.11)
WBC: 5.2 10*3/uL (ref 4.5–10.5)

## 2011-05-04 LAB — HEPATIC FUNCTION PANEL
AST: 29 U/L (ref 0–37)
Albumin: 4 g/dL (ref 3.5–5.2)
Total Bilirubin: 0.6 mg/dL (ref 0.3–1.2)

## 2011-05-04 LAB — TSH: TSH: 4.25 u[IU]/mL (ref 0.35–5.50)

## 2011-05-04 MED ORDER — CYCLOBENZAPRINE HCL 10 MG PO TABS: 10.0000 mg | ORAL_TABLET | Freq: Three times a day (TID) | ORAL | Status: DC | PRN

## 2011-05-04 MED ORDER — ALPRAZOLAM 0.5 MG PO TABS: ORAL_TABLET | ORAL | Status: DC

## 2011-05-04 MED ORDER — METOPROLOL TARTRATE 25 MG PO TABS: 25.0000 mg | ORAL_TABLET | Freq: Two times a day (BID) | ORAL | Status: DC

## 2011-05-04 MED ORDER — HYDROCODONE-ACETAMINOPHEN 5-500 MG PO TABS: ORAL_TABLET | ORAL | Status: DC

## 2011-05-04 MED ORDER — LEVOTHYROXINE SODIUM 125 MCG PO TABS: 125.0000 ug | ORAL_TABLET | Freq: Every day | ORAL | Status: DC

## 2011-05-04 MED ORDER — ALBUTEROL SULFATE HFA 108 (90 BASE) MCG/ACT IN AERS: 2.0000 | INHALATION_SPRAY | Freq: Four times a day (QID) | RESPIRATORY_TRACT | Status: DC | PRN

## 2011-05-05 ENCOUNTER — Encounter: Payer: Self-pay | Admitting: Pulmonary Disease

## 2011-05-05 LAB — VITAMIN D 25 HYDROXY (VIT D DEFICIENCY, FRACTURES): Vit D, 25-Hydroxy: 28 ng/mL — ABNORMAL LOW (ref 30–89)

## 2011-05-05 NOTE — Patient Instructions (Signed)
Today we updated your med list in EPIC...    Continue your current meds the same...  We will call you w/ your CXR & Blood work results...  Let's get on track w/ diet & exercise, the goal is to lose 10-15 lbs...  Call for any questions..  Let's plan a follw up exam in 1 yr, sooner if needed.Marland KitchenMarland Kitchen

## 2011-05-05 NOTE — Progress Notes (Signed)
Subjective:    Patient ID: Debra Watkins, female    DOB: 09-10-1955, 55 y.o.   MRN: [id number]  HPI 55 y/o WF here for a follow up visit... she has multiple medical problems as noted below...    ~  Followed for general medical purposes & she is the daughter of Debra Watkins, a long time pt of mine...  she had a CPX 2/10 w/ routine screening colonoscopy rec to pt- not yet scheduled due to $$$. ~  Hx hyperthyroidism and received I- 131 therapy (17.67mC) on 11/16/07... f/u labs showed the developement of post-RAI hypothyroidism and Synthroid started 8/09 & titrated up to 162mcg/d...  ~  February 19, 2009:  due for f/u CXR secondary to film 2/10 showing ?4mm left lung nodule, radiology rec CT Chest & we opted for 19mo f/u film... asymptomatic, never smoker, no hx cardiopulm disease... unfortunately she has gained 8# to 213# despite diet efforts & we discussed this again... her Hg was 11.9 w/MCV= 85 last visit & she needs the colon check- f/u labs today... finally she Vit D level= 15 and started on 50000 u weekly 2/10...  ~  April 08, 2010:  Yearly ROV & CPX-  she had colonoscopy 8/11 by DrPerry> neg, norm colon, no lesions seen... generally stable- notes some mild arthritic symptoms & Rx w/ NSAID OTC... denies palpit, CP, SOB, etc;  Chol only fair on diet alone w/ weight up 15# to 228# today;  TSH noted to still be elevated & she claims taking Synthroid125 daily (I called [facility] [place]- refiiled 03/07/10 & 01/20/10, therefore NOT taking it every day)... Vit D improved but still lower end normal at 33 on 50K per week- keep same.  ~  May 04, 2011:  Yearly ROV & CPX>  Ok FLU vaccine today; states she is unable to take Tetanus vaccines...    ?Pulm Nodule>  ?4mm left lung nodule on CXR 2/10, neversmoker, serial films w/o nodule seen, CXR today=clear/ NAD/ ?osteopenia (needs BMD)...    Hx palpit>  Related to her prev hyperthyroidism, no prob since this was treated; on Metoprolol & denies CP, palpit,  dizzy, syncope, edema...    Chol>  On diet alone; wt up slightly despite efforts; FLP today looks good- continue diet/ exercise/ etc...    Overweight>  Weight= 232# up 4# this yr; BMI=40 & we reviewed diet/ exercise/ wt reducing strategy; doesn't want surg, consider [employer] etc...    Thyroid>  Hx ?Graves vs toxic multinod goiter; hx non-compliant w/ antithyroid drugs; 4/09treated w/ I-131; post radiation hypothyroidism treated w/ Synthroid- stable on 160mcg/d when she takes it regularly; TSH today= 4.25 & reminded (again) to take med daily...    DJD, knee pain, LBP>  Followed by [name]- ?torn meniscus in right knee, treated w/ brace & anti-inflamm meds, was supposed to get MRI but improved & held-off; CXR w/ ?part compression mid-thoracic area; needs BMD & we will arrange...    VitD defic>  VitD level was 33 last yr & she stopped her 50K supplement on her own; VitD level today= 28 & rec to take Calcium, Women'sMVI, VitD 2000u OTC supplement daily.    Borderline anemia>  Hg=12.4, MCV=88... rec to take Women'sMVI daily...          Problem List:  ALLERGY (ICD-995.3) - uses OTC meds as needed, and PROAIR HFA Prn...  ? of PULMONARY NODULE (ICD-518.89) ~  Routine CXR 2/10 CPX showed ?4mm Left lung nodule... we opted for f/u film rather than CTChest. ~  CXR 7/10 showed clear lungs- no nodule seen, sl hyperareated, mild cardiomeg, osteophytes. ~  CXR 9/11 showed borderline heart size, clear lungs, DJD spine... ~  CXR 10/12 showed clear lungs, no nodule seen, borderline heart, prom hilae, ?mild compression in TSpine?...  PALPITATIONS (ICD-785.1) - related to her prev hyperthyroidism... she remains on LOPRESSOR 25mg Bid & denies symptoms.  HYPERCHOLESTEROLEMIA (ICD-272.0) - on diet alone... ~  FLP 1/07 showed TChol 224, TG 71, HDL 59, LDL 157... diet therapy discussed... ~  FLP 1/08 showed TChol 157, TG 142, HDL 42, LDL 87... continue diet efforts... ~  FLP 2/10 showed TChol 185, TG 115, HDL 54, LDL  108 ~  FLP 9/11 showed TChol 203, TG 108, HDL 53, LDL 134... she does not want meds. ~  FLP 10/12 showed TChol 181, TG 106, HDL 57, LDL 103  OVERWEIGHT (ICD-278.02) - weight 197-234# over the last few years...  ~  weight 2/10 = 205#... diet + exercise discussed... ~  weight 7/10 = 213# ~  weight 9/11 = 228#... we reviewed diet + exercise needed. ~  Weight 10/12 = 232#  HYPERTHYROIDISM (ICD-242.90) - hx hyperthyroidism x yrs (alternately felt to be c/w Grave's disease vs toxic multinodular goiter) w/ eval and Rx from DrKrege, DrClark, and now DrEllison... she was prev noncompliant w/ medical follow up and her antithyroid drugs (eg. Tapazole)... DrEllison Rx w/ I- 131 = 17.5 mCi given 11/16/07... she denies symptoms of palpit, racing, jittery, etc...  ~  labs show TSH went from 0.02 in May09 to 38 in Aug09 and SYNTHROID 166mcg/d started for Post-Radiation Hypothyroidism... ~  labs 10/09 showed TSH = 9.66 & Synthroid incr to 124mcg/d... ~  labs 2/10 showed TSH= 2.38... continue SYNTHROID 179mcg/d. ~  labs 7/10 showed TSH= 12.00... reminded to take med daily. ~  labs 9/11 on Synthroid125 showed TSH= 20.64... call to Pharm revealed not taking meds regularly (filled #30- 8/7 & 6/22) ~  Labs 10/12 on Synthroid125 showed TSH= 4.25  S/P Colonoscopy 8/11 by DrPerry> neg, normal w/o lesions & f/u planned 66yrs.  DJD ?Torn meniscus right knee Hx of BACK PAIN - on FLEXERIL 10mg  Tid, & uses Vicodin as needed. ~  10/12:  She reports eval by DrRendall w/ ?torn meniscus right knee; on Brace & anti-inflamm RX (improved & she cancelled planned MRI)...  ?OSTEOPENIA VITAMIN D DEFICIENCY (ICD-268.9) ~  labs 2/10 showed Vit D level = 15... rec to start Vit D 50K weekly. ~  labs 9/11 showed Vit D level = 33... on Vit D 50K weekly but she stopped on her own. ~  Labs 10/12 showed Vit D level = 28 & pt rec to start WomensMVI + VitD 2000u daily... ~  CXR 10/12 w/ ?mild compression in TSpine & BMD suggested ==>  pending  ANXIETY (ICD-300.00) - uses Alprazolam 0.5mg  as needed, incr stress w/ father ill...  UNSPECIFIED ANEMIA (ICD-285.9) - she hasn't yet had her routine screening colonoscopy> reminded to sched. ~  labs 2/10 showed Hg= 11.9, MCV= 85... rec> MVI w/ Fe, & sched colon check... ~  labs 7/10 showed Hg= 12.4, MCV= 88, Fe= 54 (sat=11%)... rec> needs colon! ~  labs 9/11 showed Hg= 12.9 ~  Labs 10/12 showed Hg= 12.4, MCV= 88  HEALTH MAINTENANCE: ~  GI:  She had screening colon 8/11 by DrPerry> normal, neg, f/u 11yrs. ~  GYN:  ?Gyn - mammograms, PAP; needs BMD & we will sched here.... ~  Immuniz:  Rec to get yearly FLU vaccine- given 10/12;  ?  Last Tetanus vaccine;  Discussed Pneumonia vacc indications & Shingles vacc recs...   Past Surgical History  Procedure Date  . Tonsillectomy 1963  . Therapy for hyperthyroidism 10/2007    Outpatient Encounter Prescriptions as of 05/04/2011  Medication Sig Dispense Refill  . albuterol (PROVENTIL HFA;VENTOLIN HFA) 108 (90 BASE) MCG/ACT inhaler Inhale 2 puffs into the lungs every 6 (six) hours as needed.  1 Inhaler  11  . ALPRAZolam (XANAX) 0.5 MG tablet Take 1/2 to 1 tablet by mouth three times daily as needed for nerves  90 tablet  5  . cyclobenzaprine (FLEXERIL) 10 MG tablet Take 1 tablet (10 mg total) by mouth 3 (three) times daily as needed. For muscle spasms  90 tablet  5  . HYDROcodone-acetaminophen (VICODIN) 5-500 MG per tablet Take 1 tablet by mouth every 6 hours as needed for pain  90 tablet  5  . isometheptene-acetaminophen-dichloralphenazone (MIDRIN) 65-325-100 MG capsule Take 1 capsule by mouth 4 (four) times daily as needed. For headache       . levothyroxine (SYNTHROID, LEVOTHROID) 125 MCG tablet Take 1 tablet (125 mcg total) by mouth daily.  30 tablet  11  . metoprolol tartrate (LOPRESSOR) 25 MG tablet Take 1 tablet (25 mg total) by mouth 2 (two) times daily.  30 tablet  11  . Multiple Vitamins-Minerals (MULTIVITAMIN & MINERAL PO) Take 1  tablet by mouth daily.        . ergocalciferol (VITAMIN D2) 50000 UNITS capsule Take 50,000 Units by mouth once a week.    ==> she stopped this on her own in 2011    . DISCONTD: norethindrone-ethinyl estradiol 1/35 (ORTHO-NOVUM, NORTREL,CYCLAFEM) tablet Take 1 tablet by mouth daily.          Allergies  Allergen Reactions  . Penicillins     REACTION: rash  . Tetanus Toxoid     REACTION: hives    Current Medications, Allergies, Past Medical History, Past Surgical History, Family History, and Social History were reviewed in [employer] record.    Review of Systems        The patient complains of fatigue, muscle cramps, anxiety, and hay fever.  The patient denies fever, chills, sweats, anorexia, weakness, malaise, weight loss, sleep disorder, blurring, diplopia, eye irritation, eye discharge, vision loss, eye pain, photophobia, earache, ear discharge, tinnitus, decreased hearing, nasal congestion, nosebleeds, sore throat, hoarseness, chest pain, palpitations, syncope, dyspnea on exertion, orthopnea, PND, peripheral edema, cough, dyspnea at rest, excessive sputum, hemoptysis, wheezing, pleurisy, nausea, vomiting, diarrhea, constipation, change in bowel habits, abdominal pain, melena, hematochezia, jaundice, gas/bloating, indigestion/heartburn, dysphagia, odynophagia, dysuria, hematuria, urinary frequency, urinary hesitancy, nocturia, incontinence, back pain, joint pain, joint swelling, muscle weakness, stiffness, arthritis, sciatica, restless legs, leg pain at night, leg pain with exertion, rash, itching, dryness, suspicious lesions, paralysis, paresthesias, seizures, tremors, vertigo, transient blindness, frequent falls, frequent headaches, difficulty walking, depression, memory loss, confusion, cold intolerance, heat intolerance, polydipsia, polyphagia, polyuria, unusual weight change, abnormal bruising, bleeding, enlarged lymph nodes, urticaria, allergic rash, and recurrent  infections.     Objective:   Physical Exam     WD, Overweight, 55 y/o WF in NAD... GENERAL:  Alert & oriented; pleasant & cooperative... HEENT:  [place]/AT, EOM-wnl, PERRLA, EACs-clear, TMs-wnl, NOSE-clear, THROAT-clear & wnl. NECK:  Supple w/ fairROM; no JVD; normal carotid impulses w/o bruits; palp thyroid w/o nodules felt; no lymphadenopathy. CHEST:  Clear to P & A; without wheezes/ rales/ or rhonchi. HEART:  Regular rhythm, without murmurs/ rubs/ or gallops heard... ABDOMEN:  Soft & nontender, normal bowel sounds; no organomegaly or masses detected. EXT: without deformities, mild arthritic changes; no varicose veins/ +venous insuffic/ no edema. NEURO:  CN's intact; no focal neuro deficits... DERM:  No lesions noted; no rash etc...   Assessment & Plan:   ?Pulm Nodule>  ?4mm left lung nodule on CXR 2/10, neversmoker, serial films w/o nodule seen, CXR today=clear/ NAD/ ?osteopenia (needs BMD)...     Hx palpit>  Related to her prev hyperthyroidism, no prob since this was treated; on Metoprolol & denies CP, palpit, dizzy, syncope, edema...     Chol>  On diet alone; wt up slightly despite efforts; FLP today looks good- continue diet/ exercise/ etc...     Overweight>  Weight= 232# up 4# this yr; BMI=40 & we reviewed diet/ exercise/ wt reducing strategy; doesn't want surg, consider [employer] etc...     Thyroid>  Hx ?Graves vs toxic multinod goiter; hx non-compliant w/ antithyroid drugs; 4/09treated w/ I-131; post radiation hypothyroidism treated w/ Synthroid- stable on 16mcg/d when she takes it regularly; TSH today= 4.25 & reminded (again) to take med daily...     DJD, knee pain, LBP>  Followed by [name]- ?torn meniscus in right knee, treated w/ brace & anti-inflamm meds, was supposed to get MRI but improved & held-off; CXR w/ ?part compression mid-thoracic area; needs BMD & we will arrange...     VitD defic>  VitD level was 33 last yr & she stopped her 50K supplement on her own; VitD level  today= 28 & rec to take Calcium, Women'sMVI, VitD 2000u OTC supplement daily.     Borderline anemia>  Hg=12.4, MCV=88... rec to take Women'sMVI daily.Marland KitchenMarland Kitchen

## 2011-05-06 ENCOUNTER — Telehealth: Payer: Self-pay | Admitting: Pulmonary Disease

## 2011-05-06 DIAGNOSIS — M8580 Other specified disorders of bone density and structure, unspecified site: Secondary | ICD-10-CM

## 2011-05-06 NOTE — Telephone Encounter (Signed)
Called and spoke with pt about her lab and cxr results per SN.  Please see result note.  Order for BMD has been placed for the pt

## 2011-05-12 ENCOUNTER — Ambulatory Visit (INDEPENDENT_AMBULATORY_CARE_PROVIDER_SITE_OTHER)
Admission: RE | Admit: 2011-05-12 | Discharge: 2011-05-12 | Disposition: A | Discharge status: Home / Self Care | Payer: BC Managed Care – PPO | Source: Ambulatory Visit

## 2011-05-12 DIAGNOSIS — M899 Disorder of bone, unspecified: Secondary | ICD-10-CM

## 2011-05-12 DIAGNOSIS — M8580 Other specified disorders of bone density and structure, unspecified site: Secondary | ICD-10-CM

## 2011-05-18 ENCOUNTER — Encounter: Payer: Self-pay | Admitting: Pulmonary Disease

## 2011-05-19 ENCOUNTER — Telehealth: Payer: Self-pay | Admitting: Pulmonary Disease

## 2011-05-19 NOTE — Telephone Encounter (Signed)
Pt notified by Mindy... [facility] BMD shows mod osteopenia in hips... REC> take Calcium, MVI, & VitD 2000u daily...   I spoke with patient about results and he verbalized understanding and had no questions

## 2012-05-04 ENCOUNTER — Encounter: Payer: Self-pay | Admitting: *Deleted

## 2012-05-07 ENCOUNTER — Ambulatory Visit (INDEPENDENT_AMBULATORY_CARE_PROVIDER_SITE_OTHER): Payer: PRIVATE HEALTH INSURANCE | Admitting: Pulmonary Disease

## 2012-05-07 ENCOUNTER — Other Ambulatory Visit (INDEPENDENT_AMBULATORY_CARE_PROVIDER_SITE_OTHER): Payer: PRIVATE HEALTH INSURANCE

## 2012-05-07 ENCOUNTER — Ambulatory Visit (INDEPENDENT_AMBULATORY_CARE_PROVIDER_SITE_OTHER)
Admission: RE | Admit: 2012-05-07 | Discharge: 2012-05-07 | Disposition: A | Discharge status: Home / Self Care | Payer: PRIVATE HEALTH INSURANCE | Source: Ambulatory Visit | Attending: Pulmonary Disease | Admitting: Pulmonary Disease

## 2012-05-07 ENCOUNTER — Encounter: Payer: Self-pay | Admitting: Pulmonary Disease

## 2012-05-07 VITALS — BP 138/92 | HR 73 | Temp 97.2°F | Ht 64.0 in | Wt 238.8 lb

## 2012-05-07 DIAGNOSIS — M8580 Other specified disorders of bone density and structure, unspecified site: Secondary | ICD-10-CM

## 2012-05-07 DIAGNOSIS — E7800 Pure hypercholesterolemia, unspecified: Secondary | ICD-10-CM

## 2012-05-07 DIAGNOSIS — M1990 Unspecified osteoarthritis, unspecified site: Secondary | ICD-10-CM

## 2012-05-07 DIAGNOSIS — F411 Generalized anxiety disorder: Secondary | ICD-10-CM

## 2012-05-07 DIAGNOSIS — Z0000 Encounter for general adult medical examination without abnormal findings: Secondary | ICD-10-CM

## 2012-05-07 DIAGNOSIS — R002 Palpitations: Secondary | ICD-10-CM

## 2012-05-07 DIAGNOSIS — M949 Disorder of cartilage, unspecified: Secondary | ICD-10-CM

## 2012-05-07 DIAGNOSIS — E0590 Thyrotoxicosis, unspecified without thyrotoxic crisis or storm: Secondary | ICD-10-CM

## 2012-05-07 DIAGNOSIS — E890 Postprocedural hypothyroidism: Secondary | ICD-10-CM

## 2012-05-07 DIAGNOSIS — M899 Disorder of bone, unspecified: Secondary | ICD-10-CM

## 2012-05-07 DIAGNOSIS — E663 Overweight: Secondary | ICD-10-CM

## 2012-05-07 LAB — CBC WITH DIFFERENTIAL/PLATELET
Basophils Relative: 0.4 % (ref 0.0–3.0)
Eosinophils Relative: 2 % (ref 0.0–5.0)
HCT: 40 % (ref 36.0–46.0)
Hemoglobin: 13.1 g/dL (ref 12.0–15.0)
Lymphs Abs: 1.3 10*3/uL (ref 0.7–4.0)
MCV: 87.8 fl (ref 78.0–100.0)
Monocytes Relative: 4.9 % (ref 3.0–12.0)
Neutro Abs: 4.4 10*3/uL (ref 1.4–7.7)
WBC: 6.2 10*3/uL (ref 4.5–10.5)

## 2012-05-07 LAB — URINALYSIS, ROUTINE W REFLEX MICROSCOPIC
Nitrite: NEGATIVE
Specific Gravity, Urine: 1.02 (ref 1.000–1.030)
Total Protein, Urine: 30
pH: 6 (ref 5.0–8.0)

## 2012-05-07 LAB — BASIC METABOLIC PANEL
BUN: 12 mg/dL (ref 6–23)
Calcium: 9.6 mg/dL (ref 8.4–10.5)
GFR: 68.97 mL/min (ref >60.00)
Glucose, Bld: 114 mg/dL — ABNORMAL HIGH (ref 70–99)
Sodium: 139 mEq/L (ref 135–145)

## 2012-05-07 LAB — HEPATIC FUNCTION PANEL
Albumin: 4 g/dL (ref 3.5–5.2)
Total Bilirubin: 0.4 mg/dL (ref 0.3–1.2)

## 2012-05-07 LAB — LIPID PANEL
HDL: 57.6 mg/dL (ref >39.00)
Triglycerides: 104 mg/dL (ref 0.0–149.0)
VLDL: 20.8 mg/dL (ref 0.0–40.0)

## 2012-05-07 MED ORDER — ALBUTEROL SULFATE HFA 108 (90 BASE) MCG/ACT IN AERS: 2.0000 | INHALATION_SPRAY | Freq: Four times a day (QID) | RESPIRATORY_TRACT | Status: DC | PRN

## 2012-05-07 MED ORDER — LEVOTHYROXINE SODIUM 125 MCG PO TABS: 125.0000 ug | ORAL_TABLET | Freq: Every day | ORAL | Status: DC

## 2012-05-07 MED ORDER — METOPROLOL TARTRATE 25 MG PO TABS: 25.0000 mg | ORAL_TABLET | Freq: Two times a day (BID) | ORAL | Status: DC

## 2012-05-07 MED ORDER — ALPRAZOLAM 0.5 MG PO TABS: ORAL_TABLET | ORAL | Status: DC

## 2012-05-07 MED ORDER — CYCLOBENZAPRINE HCL 10 MG PO TABS: 10.0000 mg | ORAL_TABLET | Freq: Three times a day (TID) | ORAL | Status: DC | PRN

## 2012-05-07 MED ORDER — HYDROCODONE-ACETAMINOPHEN 5-500 MG PO TABS: ORAL_TABLET | ORAL | Status: DC

## 2012-05-07 NOTE — Patient Instructions (Addendum)
Today we updated your med list in our EPIC system...    Continue your current medications the same...  Today we did your follow up CXR & FASTING blood work...    We will communicate the results to you when avail...  We gave you the 2013 flu vaccine 7 wrote a prescription for the Shingles shot...  Do the warm soaks and stretching exercises for your heel pain...    Let's get back on track for our diet & exercise program...  Call for any problems...  Let's plan a follow up visit in 1 years time, sooner if needed for any reason.Marland KitchenMarland Kitchen

## 2012-05-07 NOTE — Progress Notes (Signed)
Subjective:    Patient ID: Debra Watkins, female    DOB: 25-Jul-1956, 56 y.o.   MRN: [id number]  HPI 56 y/o WF here for a follow up visit... she has multiple medical problems as noted below...    ~  Followed for general medical purposes & she is the daughter of Lucca Cruzan, a long time pt of mine...  she had a CPX 2/10 w/ routine screening colonoscopy rec to pt- not yet scheduled due to $$$. ~  Hx hyperthyroidism and received I- 131 therapy (17.62mC) on 11/16/07... f/u labs showed the developement of post-RAI hypothyroidism and Synthroid started 8/09 & titrated up to 146mcg/d...  ~  February 19, 2009:  due for f/u CXR secondary to film 2/10 showing ?4mm left lung nodule, radiology rec CT Chest & we opted for 75mo f/u film... asymptomatic, never smoker, no hx cardiopulm disease... unfortunately she has gained 8# to 213# despite diet efforts & we discussed this again... her Hg was 11.9 w/MCV= 85 last visit & she needs the colon check- f/u labs today... finally she Vit D level= 15 and started on 50000 u weekly 2/10...  ~  April 08, 2010:  Yearly ROV & CPX-  she had colonoscopy 8/11 by DrPerry> neg, norm colon, no lesions seen... generally stable- notes some mild arthritic symptoms & Rx w/ NSAID OTC... denies palpit, CP, SOB, etc;  Chol only fair on diet alone w/ weight up 15# to 228# today;  TSH noted to still be elevated & she claims taking Synthroid125 daily (I called [facility]- refiiled 03/07/10 & 01/20/10, therefore NOT taking it every day)... Vit D improved but still lower end normal at 33 on 50K per week- keep same.  ~  May 04, 2011:  Yearly ROV & CPX>  Ok FLU vaccine today; states she is unable to take Tetanus vaccines...    ?Pulm Nodule>  ?4mm left lung nodule on CXR 2/10, neversmoker, serial films w/o nodule seen, CXR today=clear/ NAD/ ?osteopenia (needs BMD)...    Hx palpit>  Related to her prev hyperthyroidism, no prob since this was treated; on Metoprolol & denies CP, palpit,  dizzy, syncope, edema...    Chol>  On diet alone; wt up slightly despite efforts; FLP today looks good- continue diet/ exercise/ etc...    Overweight>  Weight= 232# up 4# this yr; BMI=40 & we reviewed diet/ exercise/ wt reducing strategy; doesn't want surg, consider [employer] etc...    Thyroid>  Hx ?Graves vs toxic multinod goiter; hx non-compliant w/ antithyroid drugs; 4/09treated w/ I-131; post radiation hypothyroidism treated w/ Synthroid- stable on 137mcg/d when she takes it regularly; TSH today= 4.25 & reminded (again) to take med daily...    DJD, knee pain, LBP>  Followed by [name]- ?torn meniscus in right knee, treated w/ brace & anti-inflamm meds, was supposed to get MRI but improved & held-off; CXR w/ ?part compression mid-thoracic area; needs BMD & we will arrange...    VitD defic>  VitD level was 33 last yr & she stopped her 50K supplement on her own; VitD level today= 28 & rec to take Calcium, Women'sMVI, VitD 2000u OTC supplement daily.    Borderline anemia>  Hg=12.4, MCV=88... rec to take Women'sMVI daily...  ~  May 07, 2012:  Yearly ROV & [name] is managing satis but under incr stress w/ elderly parents still in independent living at [facility]...     ?Pulm Nodule>  ?4mm left lung nodule on CXR 2/10, neversmoker, serial films w/o nodule seen, CXR  today= borderline cardiomeg/clear/ NAD...    Hx palpit>  Related to her prev hyperthyroidism, no prob since this was treated; on Metoprolol & denies CP, palpit, dizzy, syncope, edema...    Chol>  On diet alone; wt up slightly despite efforts; FLP today looks fair- continue diet/ exercise/ etc & must get wt down!    Overweight>  Weight= 239# up 7# this yr; BMI=40 & we reviewed diet/ exercise/ wt reducing strategy; doesn't want surg, consider [employer] etc...    Thyroid>  Hx ?Graves vs toxic multinod goiter; hx non-compliant w/ antithyroid drugs; 4/09 treated w/ I-131; post radiation hypothyroidism treated w/ Synthroid- prev stable on 157mcg/d when she  takes it regularly; TSH today=9.51 & reminded (again) to take med daily, plus we will incr the dose to 142mcg/d...    DJD, knee pain, LBP>  Followed by Emy Palau- ?torn meniscus in right knee, treated w/ brace & anti-inflamm meds, was supposed to get MRI but improved & held-off; CXR w/ ?part compression mid-thoracic area; BMD w/ osteopenia -1.6 in Rt FemNeck on Calcium, MVI, VitD 2000u/d & wt bearing exercise...    VitD defic>  VitD level was 33 prev & she stopped her 50K supplement on her own; VitD level today= 43 & rec to continue Calcium, Women'sMVI, VitD 2000u OTC supplement daily.    Borderline anemia>  Hg=13.1, MCV=88... rec to take Women's MVI daily... We reviewed prob list, meds, xrays and labs> see below for updates >> OK Flu shot today & Rx for Shingles vaccine per request... CXR 10/13 showed heart at upper lim of norm for size, clear lungs, sl overinflated, NAD... LABS 10/13:  FLP- ok x TChol=203 LDL=121 on diet alone;  Chems- ok x BS=114 on diet alone;  CBC- wnl;  TSH=9.51 on Levo125;  VitD=43;  UA- clear;  HepC Ab is neg           Problem List:  ALLERGY (ICD-995.3) - uses OTC meds as needed, and PROAIR HFA Prn...  ? of PULMONARY NODULE (ICD-518.89) ~  Routine CXR 2/10 CPX showed ?4mm Left lung nodule... we opted for f/u film rather than CTChest. ~  CXR 7/10 showed clear lungs- no nodule seen, sl hyperareated, mild cardiomeg, osteophytes. ~  CXR 9/11 showed borderline heart size, clear lungs, DJD spine... ~  CXR 10/12 showed clear lungs, no nodule seen, borderline heart, prom hilae, ?mild compression in TSpine?... ~  CXR 10/13 showed heart at upper lim of norm for size, clear lungs, sl overinflated, NAD...   PALPITATIONS (ICD-785.1) - related to her prev hyperthyroidism... she remains on LOPRESSOR 25mg Bid & denies symptoms.  HYPERCHOLESTEROLEMIA (ICD-272.0) - on diet alone... ~  FLP 1/07 showed TChol 224, TG 71, HDL 59, LDL 157... diet therapy discussed... ~  FLP 1/08 showed  TChol 157, TG 142, HDL 42, LDL 87... continue diet efforts... ~  FLP 2/10 showed TChol 185, TG 115, HDL 54, LDL 108 ~  FLP 9/11 showed TChol 203, TG 108, HDL 53, LDL 134... she does not want meds. ~  FLP 10/12 showed TChol 181, TG 106, HDL 57, LDL 103 ~  FLP 10/13 on diet alone showed TChol 203, TG 104, HDL 58, LDL 121... Doesn't want meds, needs better diet & wt reduction.  OVERWEIGHT (ICD-278.02) - weight 197-234# over the last few years...  ~  weight 2/10 = 205#... diet + exercise discussed... ~  weight 7/10 = 213# ~  weight 9/11 = 228#... we reviewed diet + exercise needed. ~  Weight 10/12 = 232# ~  Weight 10/13 = 239#  HYPERTHYROIDISM (ICD-242.90) - hx hyperthyroidism x yrs (alternately felt to be c/w Grave's disease vs toxic multinodular goiter) w/ eval and Rx from DrKrege, DrClark, and now DrEllison... she was prev noncompliant w/ medical follow up and her antithyroid drugs (eg. Tapazole)... DrEllison Rx w/ I- 131 = 17.5 mCi given 11/16/07... she denies symptoms of palpit, racing, jittery, etc...  ~  labs show TSH went from 0.02 in May09 to 38 in Aug09 and SYNTHROID 1104mcg/d started for Post-Radiation Hypothyroidism... ~  labs 10/09 showed TSH = 9.66 & Synthroid incr to 171mcg/d... ~  labs 2/10 showed TSH= 2.38... continue SYNTHROID 177mcg/d. ~  labs 7/10 showed TSH= 12.00... reminded to take med daily. ~  labs 9/11 on Synthroid125 showed TSH= 20.64... call to Pharm revealed not taking meds regularly (filled #30- 8/7 & 6/22) ~  Labs 10/12 on Synthroid125 showed TSH= 4.25 ~  Labs 10/13 showed TSH= 9.51... Reminded to take med every day, & decision to incr dose to LEVOTHYROID159mcg/d.  S/P Colonoscopy 8/11 by DrPerry> neg, normal w/o lesions & f/u planned 23yrs.  DJD >> ?Torn meniscus right knee >> Hx of BACK PAIN - on FLEXERIL 10mg  Tid, & uses Vicodin as needed. ~  10/12:  She reports eval by DrRendall w/ ?torn meniscus right knee; on Brace & anti-inflamm RX (improved & she cancelled  planned MRI)...  OSTEOPENIA >> VITAMIN D DEFICIENCY (ICD-268.9) ~  labs 2/10 showed Vit D level = 15... rec to start Vit D 50K weekly. ~  labs 9/11 showed Vit D level = 33... on Vit D 50K weekly but she stopped on her own. ~  Labs 10/12 showed Vit D level = 28 & pt rec to start WomensMVI + VitD 2000u daily... ~  CXR 10/12 w/ ?mild compression in TSpine & BMD suggested... ~  BMD 10/12 showed TScores -0.3 in Spine, and -1.6 in right FemNeck... rec to take calcium, MVI, VitD 200u/d + wt bearing exercise. ~  Labs 10/13 showed Vit d level = 43... Continue same.  ANXIETY (ICD-300.00) - uses Alprazolam 0.5mg  as needed, incr stress w/ father ill...  UNSPECIFIED ANEMIA (ICD-285.9) - she hasn't yet had her routine screening colonoscopy> reminded to sched. ~  labs 2/10 showed Hg= 11.9, MCV= 85... rec> MVI w/ Fe, & sched colon check... ~  labs 7/10 showed Hg= 12.4, MCV= 88, Fe= 54 (sat=11%)... rec> needs colon! ~  labs 9/11 showed Hg= 12.9 ~  Labs 10/12 showed Hg= 12.4, MCV= 88 ~  Labs 10/13 showed Hg= 13.1, MCV= 88  HEALTH MAINTENANCE: ~  GI:  She had screening colon 8/11 by DrPerry> normal, neg, f/u 66yrs. ~  GYN:  ?Gyn - mammograms, PAP; needs BMD & we will sched here.... ~  Immuniz:  Rec to get yearly FLU vaccine- given 10/12;  ?Last Tetanus vaccine;  Discussed Pneumonia vacc indications & Shingles vacc recs...   Past Surgical History  Procedure Date  . Tonsillectomy 1963  . Therapy for hyperthyroidism 10/2007    Outpatient Encounter Prescriptions as of 05/07/2012  Medication Sig Dispense Refill  . albuterol (PROVENTIL HFA;VENTOLIN HFA) 108 (90 BASE) MCG/ACT inhaler Inhale 2 puffs into the lungs every 6 (six) hours as needed.  1 Inhaler  11  . ALPRAZolam (XANAX) 0.5 MG tablet Take 1/2 to 1 tablet by mouth three times daily as needed for nerves  90 tablet  5  . Cholecalciferol (HM VITAMIN D3) 4000 UNITS CAPS Take 1 capsule by mouth daily.      Marland Kitchen  cyclobenzaprine (FLEXERIL) 10 MG tablet Take  1 tablet (10 mg total) by mouth 3 (three) times daily as needed. For muscle spasms  90 tablet  5  . HYDROcodone-acetaminophen (VICODIN) 5-500 MG per tablet Take 1 tablet by mouth every 6 hours as needed for pain  90 tablet  5  . levothyroxine (SYNTHROID, LEVOTHROID) 125 MCG tablet Take 1 tablet (125 mcg total) by mouth daily.  30 tablet  11  . metoprolol tartrate (LOPRESSOR) 25 MG tablet Take 1 tablet (25 mg total) by mouth 2 (two) times daily.  30 tablet  11  . Multiple Vitamins-Minerals (MULTIVITAMIN & MINERAL PO) Take 1 tablet by mouth daily.        Marland Kitchen isometheptene-acetaminophen-dichloralphenazone (MIDRIN) 65-325-100 MG capsule Take 1 capsule by mouth 4 (four) times daily as needed. For headache       . DISCONTD: ergocalciferol (VITAMIN D2) 50000 UNITS capsule Take 50,000 Units by mouth once a week.          Allergies  Allergen Reactions  . Penicillins     REACTION: rash  . Tetanus Toxoid     REACTION: hives    Current Medications, Allergies, Past Medical History, Past Surgical History, Family History, and Social History were reviewed in [employer] record.    Review of Systems         The patient complains of fatigue, muscle cramps, anxiety, and hay fever.  The patient denies fever, chills, sweats, anorexia, weakness, malaise, weight loss, sleep disorder, blurring, diplopia, eye irritation, eye discharge, vision loss, eye pain, photophobia, earache, ear discharge, tinnitus, decreased hearing, nasal congestion, nosebleeds, sore throat, hoarseness, chest pain, palpitations, syncope, dyspnea on exertion, orthopnea, PND, peripheral edema, cough, dyspnea at rest, excessive sputum, hemoptysis, wheezing, pleurisy, nausea, vomiting, diarrhea, constipation, change in bowel habits, abdominal pain, melena, hematochezia, jaundice, gas/bloating, indigestion/heartburn, dysphagia, odynophagia, dysuria, hematuria, urinary frequency, urinary hesitancy, nocturia, incontinence, back  pain, joint pain, joint swelling, muscle weakness, stiffness, arthritis, sciatica, restless legs, leg pain at night, leg pain with exertion, rash, itching, dryness, suspicious lesions, paralysis, paresthesias, seizures, tremors, vertigo, transient blindness, frequent falls, frequent headaches, difficulty walking, depression, memory loss, confusion, cold intolerance, heat intolerance, polydipsia, polyphagia, polyuria, unusual weight change, abnormal bruising, bleeding, enlarged lymph nodes, urticaria, allergic rash, and recurrent infections.     Objective:   Physical Exam     WD, Overweight, 56 y/o WF in NAD... GENERAL:  Alert & oriented; pleasant & cooperative... HEENT:  [place]alls/AT, EOM-wnl, PERRLA, EACs-clear, TMs-wnl, NOSE-clear, THROAT-clear & wnl. NECK:  Supple w/ fairROM; no JVD; normal carotid impulses w/o bruits; palp thyroid w/o nodules felt; no lymphadenopathy. CHEST:  Clear to P & A; without wheezes/ rales/ or rhonchi. HEART:  Regular rhythm, without murmurs/ rubs/ or gallops heard... ABDOMEN:  Soft & nontender, normal bowel sounds; no organomegaly or masses detected. EXT: without deformities, mild arthritic changes; no varicose veins/ +venous insuffic/ no edema. NEURO:  CN's intact; no focal neuro deficits... DERM:  No lesions noted; no rash etc...  RADIOLOGY DATA:  Reviewed in the EPIC EMR & discussed w/ the patient...  LABORATORY DATA:  Reviewed in the EPIC EMR & discussed w/ the patient...   Assessment & Plan:    ?Pulm Nodule>  ?4mm left lung nodule on CXR 2/10, neversmoker, serial films w/o nodule seen, CXR today=clear/ NAD- no nodule seen...     Hx palpit>  Related to her prev hyperthyroidism, no prob since this was treated; on Metoprolol & denies CP, palpit, dizzy, syncope, edema.Marland KitchenMarland Kitchen  Chol>  On diet alone; wt up despite efforts; FLP today looks fair- doesn't want meds, continue diet/ exercise/ & get wt down!...     Overweight>  Weight= 239# up 7# this yr; BMI=40 & we  reviewed diet/ exercise/ wt reducing strategy; doesn't want surg, consider [employer] etc...     Thyroid>  Hx ?Graves vs toxic multinod goiter; hx non-compliant w/ antithyroid drugs; 4/09 treated w/ I-131; post radiation hypothyroidism treated w/ Synthroid- prev stable on 169mcg/d when she takes it regularly; TSH today= 9.51 & we decided to nincr to 163mcg/d...     DJD, knee pain, LBP>  Followed by [name]- ?torn meniscus in right knee, treated w/ brace & anti-inflamm meds, was supposed to get MRI but improved & held-off; Ortho is following...     VitD defic>  VitD level was 33 last yr & she stopped her 50K supplement on her own; VitD level today= 43 & rec to continue Calcium, Women's MVI, VitD 2000u OTC supplement daily; BMD w/ osteopenia...     Borderline anemia>  Hg=13.1, MCV=88... rec to take Women'sMVI daily...   Patient's Medications  New Prescriptions   No medications on file  Previous Medications   ALBUTEROL (PROVENTIL HFA;VENTOLIN HFA) 108 (90 BASE) MCG/ACT INHALER    Inhale 2 puffs into the lungs every 6 (six) hours as needed.   ALPRAZOLAM (XANAX) 0.5 MG TABLET    Take 1/2 to 1 tablet by mouth three times daily as needed for nerves   CHOLECALCIFEROL (HM VITAMIN D3) 4000 UNITS CAPS    Take 1 capsule by mouth daily.   CYCLOBENZAPRINE (FLEXERIL) 10 MG TABLET    Take 1 tablet (10 mg total) by mouth 3 (three) times daily as needed. For muscle spasms   HYDROCODONE-ACETAMINOPHEN (VICODIN) 5-500 MG PER TABLET    Take 1 tablet by mouth every 6 hours as needed for pain   ISOMETHEPTENE-ACETAMINOPHEN-DICHLORALPHENAZONE (MIDRIN) 65-325-100 MG CAPSULE    Take 1 capsule by mouth 4 (four) times daily as needed. For headache    LEVOTHYROXINE (SYNTHROID, LEVOTHROID) 125 MCG TABLET    Take 1 tablet (125 mcg total) by mouth daily.   METOPROLOL TARTRATE (LOPRESSOR) 25 MG TABLET    Take 1 tablet (25 mg total) by mouth 2 (two) times daily.   MULTIPLE VITAMINS-MINERALS (MULTIVITAMIN & MINERAL PO)    Take 1 tablet  by mouth daily.    Modified Medications   No medications on file  Discontinued Medications   ERGOCALCIFEROL (VITAMIN D2) 50000 UNITS CAPSULE    Take 50,000 Units by mouth once a week.

## 2012-05-08 ENCOUNTER — Other Ambulatory Visit: Payer: Self-pay | Admitting: Pulmonary Disease

## 2012-05-08 MED ORDER — LEVOTHYROXINE SODIUM 150 MCG PO TABS: 150.0000 ug | ORAL_TABLET | Freq: Every day | ORAL | Status: DC

## 2012-06-01 LAB — HM PAP SMEAR

## 2012-06-01 LAB — HM MAMMOGRAPHY

## 2012-09-15 ENCOUNTER — Other Ambulatory Visit: Payer: Self-pay

## 2012-10-19 ENCOUNTER — Telehealth: Payer: Self-pay | Admitting: Pulmonary Disease

## 2012-10-19 MED ORDER — AZITHROMYCIN 250 MG PO TABS: ORAL_TABLET | ORAL | Status: DC

## 2012-10-19 NOTE — Telephone Encounter (Signed)
lmtcb x1 for pt. 

## 2012-10-19 NOTE — Telephone Encounter (Signed)
I spoke with pt and she c/o nasal congestion, watery eyes, runny nose, non productive cough, PND, chest congestion x wed. Denies any HA, facial pressure, chills, sweats/fever. Pt taking advil cold and sinus. Pt requesting to have something called in. Please advise SN thanks Last OV 05/07/12 Pending 05/07/13 Allergies  Allergen Reactions  . Penicillins     REACTION: rash  . Tetanus Toxoid     REACTION: hives

## 2012-10-19 NOTE — Telephone Encounter (Signed)
Pt is aware of SN recs. RX has been sent. Nothing further was needed

## 2012-10-19 NOTE — Telephone Encounter (Signed)
Per SN--- zpak #1  Take as directed mucinex  Fluids

## 2012-10-30 ENCOUNTER — Other Ambulatory Visit: Payer: Self-pay | Admitting: Pulmonary Disease

## 2013-05-02 ENCOUNTER — Other Ambulatory Visit: Payer: Self-pay | Admitting: Pulmonary Disease

## 2013-05-07 ENCOUNTER — Ambulatory Visit: Payer: PRIVATE HEALTH INSURANCE | Admitting: Pulmonary Disease

## 2013-05-09 ENCOUNTER — Ambulatory Visit (INDEPENDENT_AMBULATORY_CARE_PROVIDER_SITE_OTHER): Payer: BC Managed Care – PPO | Admitting: Family Medicine

## 2013-05-09 VITALS — BP 136/68 | HR 102 | Temp 99.9°F | Resp 18 | Ht 65.0 in | Wt 240.0 lb

## 2013-05-09 DIAGNOSIS — J9801 Acute bronchospasm: Secondary | ICD-10-CM

## 2013-05-09 DIAGNOSIS — J069 Acute upper respiratory infection, unspecified: Secondary | ICD-10-CM

## 2013-05-09 MED ORDER — AZITHROMYCIN 250 MG PO TABS: ORAL_TABLET | ORAL | Status: DC

## 2013-05-09 MED ORDER — HYDROCOD POLST-CHLORPHEN POLST 10-8 MG/5ML PO LQCR: 5.0000 mL | Freq: Two times a day (BID) | ORAL | Status: DC | PRN

## 2013-05-09 NOTE — Patient Instructions (Signed)
1.  RECOMMEND USING NASAL SPRAY FOUR TIMES DAILY FOR THE NEXT 5 DAYS. 2.  RECOMMEND MUCINEX DM ONE TABLET TWICE DAILY. 3.  IF NO IMPROVEMENT IN 48-72 HOURS, START ZPACK. 4. TAKE COUGH SYRUP AS NEEDED FOR COUGH IN THE EVENINGS; THIS WILL CAUSE DROWSINESS. 5.  CONTINUE ALBUTEROL THREE TIMES DAILY.    Upper Respiratory Infection, Adult An upper respiratory infection (URI) is also known as the common cold. It is often caused by a type of germ (virus). Colds are easily spread (contagious). You can pass it to others by kissing, coughing, sneezing, or drinking out of the same glass. Usually, you get better in 1 or 2 weeks.  HOME CARE   Only take medicine as told by your doctor.  Use a warm mist humidifier or breathe in steam from a hot shower.  Drink enough water and fluids to keep your pee (urine) clear or pale yellow.  Get plenty of rest.  Return to work when your temperature is back to normal or as told by your doctor. You may use a face mask and wash your hands to stop your cold from spreading. GET HELP RIGHT AWAY IF:   After the first few days, you feel you are getting worse.  You have questions about your medicine.  You have chills, shortness of breath, or brown or red spit (mucus).  You have yellow or brown snot (nasal discharge) or pain in the face, especially when you bend forward.  You have a fever, puffy (swollen) neck, pain when you swallow, or white spots in the back of your throat.  You have a bad headache, ear pain, sinus pain, or chest pain.  You have a high-pitched whistling sound when you breathe in and out (wheezing).  You have a lasting cough or cough up blood.  You have sore muscles or a stiff neck. MAKE SURE YOU:   Understand these instructions.  Will watch your condition.  Will get help right away if you are not doing well or get worse. Document Released: 01/04/2008 Document Revised: 10/10/2011 Document Reviewed: 11/22/2010 [facility] Patient Information  2014 [place], [place].

## 2013-05-09 NOTE — Progress Notes (Signed)
[address]   [place], [place]  [address]   [phone]  Subjective:    Patient ID: Araceli Bouche, female    DOB: 09-Feb-1956, 57 y.o.   MRN: [id number]  Cough Associated symptoms include chills, postnasal drip, rhinorrhea, a sore throat and wheezing. Pertinent negatives include no ear pain, fever, headaches, rash or shortness of breath.  Shortness of Breath Associated symptoms include rhinorrhea, a sore throat and wheezing. Pertinent negatives include no abdominal pain, ear pain, fever, headaches, rash or vomiting.   This 57 y.o. female presents for evaluation of cough and congestion.  Onset two days ago.  Low grade fever fever.  No fever at home.  +chills; no sweats.  No headache; +scratchy throat; no ear pain.  +sneezing.  +nasal congestion; +rhinorrhea clear.  +coughing; +PND; no sputum production.  No SOB.  No v/d.  No tobacco.  Childhood asthma.  Mild wheezing.  Using albuterol three times daily.  Works for Production assistant, radio.  No other medications. Taking OTC nasal spray sporadically.  Review of Systems  Constitutional: Positive for chills. Negative for fever and diaphoresis.  HENT: Positive for postnasal drip, rhinorrhea, sneezing and sore throat. Negative for ear pain, sinus pressure, trouble swallowing and voice change.   Respiratory: Positive for cough and wheezing. Negative for shortness of breath.   Gastrointestinal: Negative for nausea, vomiting, abdominal pain and diarrhea.  Skin: Negative for rash.  Neurological: Negative for dizziness, light-headedness and headaches.   Past Medical History  Diagnosis Date  . Allergy history unknown   . Pulmonary nodule   . Palpitations   . Hypercholesteremia   . Hyperthyroidism   . Hypothyroidism     post radiation  . Overweight   . Lumbar back pain   . Vitamin D deficiency   . Headache(784.0)   . Anxiety   . Unspecified deficiency anemia    Allergies  Allergen Reactions  . Penicillins     REACTION: rash    . Tetanus Toxoid     REACTION: hives   Current Outpatient Prescriptions on File Prior to Visit  Medication Sig Dispense Refill  . Cholecalciferol (HM VITAMIN D3) 4000 UNITS CAPS Take 1 capsule by mouth daily.      . Multiple Vitamins-Minerals (MULTIVITAMIN & MINERAL PO) Take 1 tablet by mouth daily.         No current facility-administered medications on file prior to visit.   History  Substance Use Topics  . Smoking status: Never Smoker   . Smokeless tobacco: Not on file  . Alcohol Use: Yes     Comment: social use       Objective:   Physical Exam  Nursing note and vitals reviewed. Constitutional: She is oriented to person, place, and time. She appears well-developed and well-nourished. No distress.  HENT:  Head: Normocephalic and atraumatic.  Right Ear: External ear normal.  Left Ear: External ear normal.  Nose: Mucosal edema and rhinorrhea present.  Mouth/Throat: Mucous membranes are normal. Posterior oropharyngeal erythema present. No oropharyngeal exudate, posterior oropharyngeal edema or tonsillar abscesses.  Eyes: Conjunctivae and EOM are normal. Pupils are equal, round, and reactive to light.  Neck: Normal range of motion. Neck supple.  Cardiovascular: Normal rate, regular rhythm and normal heart sounds.   No murmur heard. Pulmonary/Chest: Effort normal. No accessory muscle usage. Not tachypneic. No respiratory distress. She has no decreased breath sounds. She has wheezes in the right middle field and the left middle field. She has no rhonchi. She  has no rales.  Scant expiratory wheezing B posterior mid lungs; no prolonged expiratory phase; no tachypnea.  Lymphadenopathy:    She has cervical adenopathy.  Neurological: She is alert and oriented to person, place, and time.  Skin: Skin is warm and dry. No rash noted. She is not diaphoretic.  Psychiatric: She has a normal mood and affect. Her behavior is normal.       Assessment & Plan:  Bronchospasm  Acute upper  respiratory infections of unspecified site  1. URI:  New.  With history of asthma and acute bronchospasm.  Treat with Zpack; Tussionex.  Supportive care with rest, fluids, Tylenol and Motrin.  2.  Bronchospasm:  New. Associated with acute URI; increase Albuterol use to qid scheduled for the next five days and then PRN.  RTC for acute worsening; mild exacerbation at this time; does not warrant Prednisone.   Meds ordered this encounter  Medications  . DISCONTD: azithromycin (ZITHROMAX) 250 MG tablet    Sig: Take as directed    Dispense:  6 tablet    Refill:  0  . chlorpheniramine-HYDROcodone (TUSSIONEX PENNKINETIC ER) 10-8 MG/5ML LQCR    Sig: Take 5 mLs by mouth every 12 (twelve) hours as needed.    Dispense:  180 mL    Refill:  0   Nilda Simmer, M.D.  [facility] [address] [place], [place]  [address] [phone] phone [phone] fax

## 2013-06-06 ENCOUNTER — Other Ambulatory Visit: Payer: Self-pay | Admitting: Pulmonary Disease

## 2013-06-06 ENCOUNTER — Other Ambulatory Visit: Payer: Self-pay

## 2013-06-25 ENCOUNTER — Ambulatory Visit (INDEPENDENT_AMBULATORY_CARE_PROVIDER_SITE_OTHER): Payer: BC Managed Care – PPO | Admitting: Pulmonary Disease

## 2013-06-25 ENCOUNTER — Ambulatory Visit (INDEPENDENT_AMBULATORY_CARE_PROVIDER_SITE_OTHER)
Admission: RE | Admit: 2013-06-25 | Discharge: 2013-06-25 | Disposition: A | Discharge status: Home / Self Care | Payer: BC Managed Care – PPO | Source: Ambulatory Visit | Attending: Pulmonary Disease | Admitting: Pulmonary Disease

## 2013-06-25 ENCOUNTER — Encounter: Payer: Self-pay | Admitting: Pulmonary Disease

## 2013-06-25 ENCOUNTER — Other Ambulatory Visit (INDEPENDENT_AMBULATORY_CARE_PROVIDER_SITE_OTHER): Payer: BC Managed Care – PPO

## 2013-06-25 VITALS — BP 134/88 | HR 70 | Temp 97.5°F | Ht 63.5 in | Wt 239.4 lb

## 2013-06-25 DIAGNOSIS — Z0000 Encounter for general adult medical examination without abnormal findings: Secondary | ICD-10-CM

## 2013-06-25 DIAGNOSIS — M899 Disorder of bone, unspecified: Secondary | ICD-10-CM

## 2013-06-25 DIAGNOSIS — E0590 Thyrotoxicosis, unspecified without thyrotoxic crisis or storm: Secondary | ICD-10-CM

## 2013-06-25 DIAGNOSIS — R002 Palpitations: Secondary | ICD-10-CM

## 2013-06-25 DIAGNOSIS — M1990 Unspecified osteoarthritis, unspecified site: Secondary | ICD-10-CM

## 2013-06-25 DIAGNOSIS — E663 Overweight: Secondary | ICD-10-CM

## 2013-06-25 DIAGNOSIS — E7800 Pure hypercholesterolemia, unspecified: Secondary | ICD-10-CM

## 2013-06-25 DIAGNOSIS — Z23 Encounter for immunization: Secondary | ICD-10-CM

## 2013-06-25 DIAGNOSIS — E890 Postprocedural hypothyroidism: Secondary | ICD-10-CM

## 2013-06-25 DIAGNOSIS — F411 Generalized anxiety disorder: Secondary | ICD-10-CM

## 2013-06-25 DIAGNOSIS — M19019 Primary osteoarthritis, unspecified shoulder: Secondary | ICD-10-CM

## 2013-06-25 DIAGNOSIS — M8580 Other specified disorders of bone density and structure, unspecified site: Secondary | ICD-10-CM

## 2013-06-25 LAB — BASIC METABOLIC PANEL
BUN: 13 mg/dL (ref 6–23)
CO2: 28 mEq/L (ref 19–32)
Chloride: 102 mEq/L (ref 96–112)
Creatinine, Ser: 0.7 mg/dL (ref 0.4–1.2)
GFR: 86.09 mL/min (ref >60.00)
Glucose, Bld: 108 mg/dL — ABNORMAL HIGH (ref 70–99)
Potassium: 4.5 mEq/L (ref 3.5–5.1)

## 2013-06-25 LAB — CBC WITH DIFFERENTIAL/PLATELET
Basophils Relative: 0.5 % (ref 0.0–3.0)
Eosinophils Absolute: 0.3 10*3/uL (ref 0.0–0.7)
Eosinophils Relative: 4.7 % (ref 0.0–5.0)
HCT: 36.9 % (ref 36.0–46.0)
Lymphocytes Relative: 34.2 % (ref 12.0–46.0)
MCV: 87.2 fl (ref 78.0–100.0)
Monocytes Absolute: 0.4 10*3/uL (ref 0.1–1.0)
Neutrophils Relative %: 53.9 % (ref 43.0–77.0)
Platelets: 250 10*3/uL (ref 150.0–400.0)
RBC: 4.23 Mil/uL (ref 3.87–5.11)
RDW: 14.1 % (ref 11.5–14.6)
WBC: 5.6 10*3/uL (ref 4.5–10.5)

## 2013-06-25 LAB — LIPID PANEL
Cholesterol: 177 mg/dL (ref 0–200)
LDL Cholesterol: 101 mg/dL — ABNORMAL HIGH (ref 0–99)
Triglycerides: 90 mg/dL (ref 0.0–149.0)

## 2013-06-25 LAB — HEPATIC FUNCTION PANEL
ALT: 25 U/L (ref 0–35)
AST: 24 U/L (ref 0–37)
Total Protein: 7.6 g/dL (ref 6.0–8.3)

## 2013-06-25 LAB — HEMOGLOBIN A1C: Hgb A1c MFr Bld: 6.4 % (ref 4.6–6.5)

## 2013-06-25 MED ORDER — HYDROCODONE-ACETAMINOPHEN 5-325 MG PO TABS: ORAL_TABLET | ORAL | Status: DC

## 2013-06-25 MED ORDER — ALPRAZOLAM 0.5 MG PO TABS: ORAL_TABLET | ORAL | Status: DC

## 2013-06-25 MED ORDER — METOPROLOL TARTRATE 25 MG PO TABS: ORAL_TABLET | ORAL | Status: DC

## 2013-06-25 MED ORDER — CYCLOBENZAPRINE HCL 10 MG PO TABS: 10.0000 mg | ORAL_TABLET | Freq: Three times a day (TID) | ORAL | Status: DC | PRN

## 2013-06-25 MED ORDER — LEVOTHYROXINE SODIUM 150 MCG PO TABS: ORAL_TABLET | ORAL | Status: DC

## 2013-06-25 MED ORDER — ALBUTEROL SULFATE HFA 108 (90 BASE) MCG/ACT IN AERS: 2.0000 | INHALATION_SPRAY | Freq: Four times a day (QID) | RESPIRATORY_TRACT | Status: DC | PRN

## 2013-06-25 NOTE — Patient Instructions (Signed)
Today we updated your med list in our EPIC system...    Continue your current medications the same...    We refilled your meds per request...  We gave you the 2014 Flu vaccine...  Today we did your follow up CXR & FASTING blood work...    We will contact you w/ the results when available...   Let's get on track w/ our DIET & EXERCISE program...    The goal is top lose 10-15 lbs to start!  Call for any questions...  Let's plan a follow up visit in 62yr, sooner if needed for problems.Marland KitchenMarland Kitchen

## 2013-06-25 NOTE — Progress Notes (Signed)
Subjective:    Patient ID: Debra Watkins, female    DOB: [birth year], 57 y.o.   MRN: [id number]  HPI 57 y/o WF here for a follow up visit... she has multiple medical problems as noted below...    ~  Followed for general medical purposes & she is the daughter of Debra Watkins, a long time pt of mine...  she had a CPX 2/10 w/ routine screening colonoscopy rec to pt- not yet scheduled due to $$$. ~  Hx hyperthyroidism and received I- 131 therapy (17.7mC) on 11/16/07... f/u labs showed the developement of post-RAI hypothyroidism and Synthroid started 8/09 & titrated up to 164mcg/d...  ~  May 04, 2011:  Yearly ROV & CPX>  Ok FLU vaccine today; states she is unable to take Tetanus vaccines...    ?Pulm Nodule>  ?4mm left lung nodule on CXR 2/10, neversmoker, serial films w/o nodule seen, CXR today=clear/ NAD/ ?osteopenia (needs BMD)...    Hx palpit>  Related to her prev hyperthyroidism, no prob since this was treated; on Metoprolol & denies CP, palpit, dizzy, syncope, edema...    Chol>  On diet alone; wt up slightly despite efforts; FLP today looks good- continue diet/ exercise/ etc...    Overweight>  Weight= 232# up 4# this yr; BMI=40 & we reviewed diet/ exercise/ wt reducing strategy; doesn't want surg, consider [employer] etc...    Thyroid>  Hx ?Graves vs toxic multinod goiter; hx non-compliant w/ antithyroid drugs; 4/09treated w/ I-131; post radiation hypothyroidism treated w/ Synthroid- stable on 155mcg/d when she takes it regularly; TSH today= 4.25 & reminded (again) to take med daily...    DJD, knee pain, LBP>  Followed by [name]- ?torn meniscus in right knee, treated w/ brace & anti-inflamm meds, was supposed to get MRI but improved & held-off; CXR w/ ?part compression mid-thoracic area; needs BMD & we will arrange...    VitD defic>  VitD level was 33 last yr & she stopped her 50K supplement on her own; VitD level today= 28 & rec to take Calcium, Women'sMVI, VitD 2000u OTC supplement daily.  Borderline anemia>  Hg=12.4, MCV=88... rec to take Women'sMVI daily...  ~  May 07, 2012:  Yearly ROV & [name] is managing satis but under incr stress w/ elderly parents still in independent living at [facility]...     ?Pulm Nodule>  ?4mm left lung nodule on CXR 2/10, neversmoker, serial films w/o nodule seen, CXR today= borderline cardiomeg/clear/ NAD...    Hx palpit>  Related to her prev hyperthyroidism, no prob since this was treated; on Metoprolol & denies CP, palpit, dizzy, syncope, edema...    Chol>  On diet alone; wt up slightly despite efforts; FLP today looks fair- continue diet/ exercise/ etc & must get wt down!    Overweight>  Weight= 239# up 7# this yr; BMI=40 & we reviewed diet/ exercise/ wt reducing strategy; doesn't want surg, consider [employer] etc...    Thyroid>  Hx ?Graves vs toxic multinod goiter; hx non-compliant w/ antithyroid drugs; 4/09 treated w/ I-131; post radiation hypothyroidism treated w/ Synthroid- prev stable on 134mcg/d when she takes it regularly; TSH today=9.51 & reminded (again) to take med daily, plus we will incr the dose to 151mcg/d...    DJD, knee pain, LBP>  Followed by [name]- ?torn meniscus in right knee, treated w/ brace & anti-inflamm meds, was supposed to get MRI but improved & held-off; CXR w/ ?part compression mid-thoracic area; BMD w/ osteopenia -1.6 in Rt FemNeck on Calcium, MVI, VitD 2000u/d &  wt bearing exercise...    VitD defic>  VitD level was 33 prev & she stopped her 50K supplement on her own; VitD level today= 43 & rec to continue Calcium, Women'sMVI, VitD 2000u OTC supplement daily.    Borderline anemia>  Hg=13.1, MCV=88... rec to take Women's MVI daily... We reviewed prob list, meds, xrays and labs> see below for updates >> OK Flu shot today & Rx for Shingles vaccine per request... CXR 10/13 showed heart at upper lim of norm for size, clear lungs, sl overinflated, NAD... 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    Hx palpit>  Related to her prev hyperthyroidism, no prob since this was treated; on Metoprolol25Bid & denies CP, palpit, dizzy, syncope, edema...    Chol>  On diet alone; wt up slightly despite efforts; FLP today looks good- TChol 177, TG 90, HDL 58, LDL 101; continue diet/ exercise/ etc & must get wt down!    Overweight>  Weight= 239# w/ BMI=40 & we reviewed diet/ exercise/ wt reducing strategy; doesn't want surg, consider [employer] etc...    Thyroid>  Hx ?Graves vs toxic multinod goiter; & non-compliant w/ antithyroid drugs; 4/09 treated w/ I-131; post radiation hypothyroidism treated w/ Synthroid- grad incr to 145mcg/d; Labs 11/14 showed TSH=4.37 & reminded to take meds every day.    DJD, knee pain, LBP, osteopenia>  Followed by [name]- ?torn meniscus in right knee, treated w/ brace & anti-inflamm meds, was supposed to get MRI but improved & held-off; CXR w/ ?part compression mid-thoracic area; BMD w/ osteopenia -1.6 in Rt FemNeck on Calcium, MVI, VitD 2000u/d & wt bearing exercise...    VitD defic>  VitD level was 33 prev & she stopped her 50K supplement on her own; last VitD level 10/13 = 43 & rec to continue Calcium, Women'sMVI, VitD 2000u OTC supplement daily.    Borderline anemia>  Hg=12.2, MCV=87... rec to take Women's MVI daily... We reviewed prob list, meds, xrays and labs> see below for updates >> OK 2014 Flu vaccine today, states she allergic to the Tetanus shots...   CXR 11/14 showed borderline heart size, clear lungs, DJD spine, NAD...  LABS 11/14:  FLP- at goals on diet alone;  Chems- ok w/ BS=108, A1c=6.4;  CBC- ok w/ Hg=12.2;  TSH=4.37 on synthroid150...            Problem List:  ALLERGY (ICD-995.3) - uses OTC meds as needed, and PROAIR HFA Prn...  ? of PULMONARY NODULE (ICD-518.89) ~  Routine CXR 2/10 CPX showed ?4mm Left lung nodule... we opted for f/u film rather than CTChest. ~  CXR 7/10 showed clear lungs- no nodule seen, sl hyperareated, mild cardiomeg, osteophytes. ~  CXR 9/11 showed borderline heart size, clear lungs, DJD spine... ~  CXR 10/12 showed clear lungs, no nodule seen, borderline heart, prom hilae, ?mild compression in TSpine?Marland Kitchen.. ~  CXR 10/13 showed heart at upper lim of norm for size, clear lungs, sl overinflated, NAD.Marland Kitchen.  ~  CXR 11/14 showed borderline heart size, clear lungs, DJD spine, NAD...  PALPITATIONS (ICD-785.1) - related to her prev hyperthyroidism... she remains on LOPRESSOR 25mg Bid & denies symptoms.  HYPERCHOLESTEROLEMIA (ICD-272.0) - on diet alone... ~  FLP 1/07 showed TChol 224, TG 71, HDL 59, LDL 157... diet therapy discussed... ~  FLP 1/08 showed TChol 157, TG 142, HDL 42, LDL 87... continue diet efforts... ~  FLP 2/10 showed TChol 185, TG 115, HDL 54, LDL 108 ~  FLP 9/11 showed TChol 203, TG 108, HDL 53, LDL 134... she does not want meds. ~  FLP 10/12 showed TChol 181, TG 106, HDL 57, LDL 103 ~  FLP 10/13 on diet alone showed TChol 203, TG 104, HDL 58, LDL 121... Doesn't want meds, needs better diet & wt reduction. ~  FLP 11/14 on diet alone showed TChol 177, TG 90, HDL 58, LDL 101  OVERWEIGHT (ICD-278.02) - weight 197-234# over the last few years...  ~  weight 2/10 = 205#... diet + exercise discussed... ~  weight 7/10 = 213# ~  weight 9/11 = 228#... we reviewed diet + exercise needed. ~  Weight 10/12 = 232# ~  Weight 10/13 = 239# ~  Weight 11/14 = 239#  HYPERTHYROIDISM (ICD-242.90) - hx  hyperthyroidism x yrs (alternately felt to be c/w Grave's disease vs toxic multinodular goiter) w/ eval and Rx from DrKrege, DrClark, and now DrEllison... she was prev noncompliant w/ medical follow up and her antithyroid drugs (eg. Tapazole)... DrEllison Rx w/ I- 131 = 17.5 mCi given 11/16/07... she denies symptoms of palpit, racing, jittery, etc...  ~  labs Watkins TSH went from 0.02 in May09 to 38 in Aug09 and SYNTHROID 129mcg/d started for Post-Radiation Hypothyroidism... ~  labs 10/09 showed TSH = 9.66 & Synthroid incr to 138mcg/d... ~  labs 2/10 showed TSH= 2.38... continue SYNTHROID 1109mcg/d. ~  labs 7/10 showed TSH= 12.00... reminded to take med daily. ~  labs 9/11 on Synthroid125 showed TSH= 20.64... call to Pharm revealed not taking meds regularly (filled #30- 8/7 & 6/22) ~  Labs 10/12 on Synthroid125 showed TSH= 4.25 ~  Labs 10/13 on Synthroid125 showed TSH= 9.51... Reminded to take med every day, & decision to incr dose to LEVOTHYROID173mcg/d. ~  Labs 11/14 on Synthroid150 showed TSH= 4.37  S/P Colonoscopy 8/11 by DrPerry> neg, normal w/o lesions & f/u planned 39yrs.  DJD >> ?Torn meniscus right knee >> Hx of BACK PAIN - on FLEXERIL 10mg  Tid, & uses Vicodin as needed. ~  10/12:  She reports eval by DrRendall w/ ?torn meniscus right knee; on Brace & anti-inflamm RX (improved & she cancelled planned MRI)...  OSTEOPENIA >> VITAMIN D DEFICIENCY (ICD-268.9) ~  labs 2/10 showed Vit D level = 15... rec to start Vit D 50K weekly. ~  labs 9/11 showed Vit D level = 33... on Vit D 50K weekly but she stopped on her own. ~  Labs 10/12 showed Vit D level = 28 & pt rec to start WomensMVI + VitD 2000u daily... ~  CXR 10/12 w/ ?mild compression in TSpine & BMD suggested... ~  BMD 10/12 showed TScores -0.3 in Spine, and -1.6 in right FemNeck... rec to take calcium, MVI, VitD 200u/d + wt bearing exercise. ~  Labs 10/13 showed Vit d level = 43... Continue same.  ANXIETY (ICD-300.00) - uses Alprazolam  0.5mg  as needed, incr stress w/ father ill.Marland KitchenMarland Kitchen  UNSPECIFIED ANEMIA (ICD-285.9) - she hasn't yet had her routine screening colonoscopy> reminded to sched. ~  labs 2/10 showed Hg= 11.9, MCV= 85... rec> MVI w/ Fe, & sched colon check... ~  labs 7/10 showed Hg= 12.4, MCV= 88, Fe= 54 (sat=11%)... rec> needs colon! ~  labs 9/11 showed Hg= 12.9 ~  Labs 10/12 showed Hg= 12.4, MCV= 88 ~  Labs 10/13 showed Hg= 13.1, MCV= 88 ~  Labs 11/14 showed Hg= 12.2, MCV= 87  HEALTH MAINTENANCE: ~  GI:  She had screening colon 8/11 by DrPerry> normal, neg, f/u 56yrs. ~  GYN:  DrGreywal - mammograms, PAP; needs BMD & we will sched here.... ~  Immuniz:  Rec to get yearly FLU vaccine- given 10/12;  ?Last Tetanus vaccine;  Discussed Pneumonia vacc indications & Shingles vacc recs...   Past Surgical History  Procedure Laterality Date  . Tonsillectomy  1963  . Therapy for hyperthyroidism  10/2007    Outpatient Encounter Prescriptions as of 06/25/2013  Medication Sig  . albuterol (PROVENTIL HFA;VENTOLIN HFA) 108 (90 BASE) MCG/ACT inhaler Inhale 2 puffs into the lungs every 6 (six) hours as needed.  . ALPRAZolam (XANAX) 0.5 MG tablet Take 1/2 to 1 tablet by mouth three times daily as needed for nerves  . Cholecalciferol (HM VITAMIN D3) 4000 UNITS CAPS Take 1 capsule by mouth daily.  . cyclobenzaprine (FLEXERIL) 10 MG tablet Take 1 tablet (10 mg total) by mouth 3 (three) times daily as needed. For muscle spasms  . HYDROcodone-acetaminophen (NORCO/VICODIN) 5-325 MG per tablet Take one tablet by mouth three times daily as needed  . levothyroxine (SYNTHROID, LEVOTHROID) 150 MCG tablet TAKE 1 TABLET BY MOUTH DAILY  . metoprolol tartrate (LOPRESSOR) 25 MG tablet TAKE 1 TABLET BY MOUTH TWICE DAILY  . Multiple Vitamins-Minerals (MULTIVITAMIN & MINERAL PO) Take 1 tablet by mouth daily.    . chlorpheniramine-HYDROcodone (TUSSIONEX PENNKINETIC ER) 10-8 MG/5ML LQCR Take 5 mLs by mouth every 12 (twelve) hours as needed.  .  [DISCONTINUED] azithromycin (ZITHROMAX) 250 MG tablet Take as directed  . [DISCONTINUED] HYDROcodone-acetaminophen (VICODIN) 5-500 MG per tablet Take 1 tablet by mouth every 6 hours as needed for pain  . [DISCONTINUED] isometheptene-acetaminophen-dichloralphenazone (MIDRIN) 65-325-100 MG capsule Take 1 capsule by mouth 4 (four) times daily as needed. For headache     Allergies  Allergen Reactions  . Penicillins     REACTION: rash  . Tetanus Toxoid     REACTION: hives    Current Medications, Allergies, Past Medical History, Past Surgical History, Family History, and Social History were reviewed in [employer] record.    Review of Systems        The patient complains of fatigue, muscle cramps, anxiety, and hay fever.  The patient denies fever, chills, sweats, anorexia, weakness, malaise, weight loss, sleep disorder, blurring, diplopia, eye irritation, eye discharge, vision loss, eye pain, photophobia, earache, ear discharge, tinnitus, decreased hearing, nasal congestion, nosebleeds, sore throat, hoarseness, chest pain, palpitations, syncope, dyspnea on exertion, orthopnea, PND, peripheral edema, cough, dyspnea at rest, excessive sputum, hemoptysis, wheezing, pleurisy, nausea, vomiting, diarrhea, constipation, change in bowel habits, abdominal pain, melena, hematochezia, jaundice, gas/bloating, indigestion/heartburn, dysphagia, odynophagia, dysuria, hematuria, urinary frequency, urinary hesitancy, nocturia, incontinence, back pain, joint pain, joint swelling, muscle weakness, stiffness, arthritis, sciatica, restless legs, leg pain at night, leg pain with exertion, rash, itching, dryness, suspicious lesions, paralysis, paresthesias, seizures, tremors, vertigo, transient blindness, frequent falls, frequent headaches, difficulty walking, depression, memory loss, confusion, cold intolerance, heat intolerance, polydipsia, polyphagia, polyuria,  unusual weight change, abnormal  bruising, bleeding, enlarged lymph nodes, urticaria, allergic rash, and recurrent infections.     Objective:   Physical Exam     WD, Overweight, 57 y/o WF in NAD... GENERAL:  Alert & oriented; pleasant & cooperative... HEENT:  [place]/AT, EOM-wnl, PERRLA, EACs-clear, TMs-wnl, NOSE-clear, THROAT-clear & wnl. NECK:  Supple w/ fairROM; no JVD; normal carotid impulses w/o bruits; palp thyroid w/o nodules felt; no lymphadenopathy. CHEST:  Clear to P & A; without wheezes/ rales/ or rhonchi. HEART:  Regular rhythm, without murmurs/ rubs/ or gallops heard... ABDOMEN:  Soft & nontender, normal bowel sounds; no organomegaly or masses detected. EXT: without deformities, mild arthritic changes; no varicose veins/ +venous insuffic/ no edema. NEURO:  CN's intact; no focal neuro deficits... DERM:  No lesions noted; no rash etc...  RADIOLOGY DATA:  Reviewed in the EPIC EMR & discussed w/ the patient...  LABORATORY DATA:  Reviewed in the EPIC EMR & discussed w/ the patient...   Assessment & Plan:    ?Pulm Nodule>  ?4mm left lung nodule on CXR 2/10, neversmoker, serial films w/o nodule seen, CXR today=clear/ NAD- no nodule seen...     Hx palpit>  Related to her prev hyperthyroidism, no prob since this was treated; on Metoprolol & denies CP, palpit, dizzy, syncope, edema...     Chol>  On diet alone; wt up despite efforts; FLP today looks fair- doesn't want meds, continue diet/ exercise/ & get wt down!...     Overweight>  Weight= 239# & BMI=40 & we reviewed diet/ exercise/ wt reducing strategy; doesn't want surg, consider [employer] etc...     Thyroid>  Hx ?Graves vs toxic multinod goiter; hx non-compliant w/ antithyroid drugs; we grad incr the Synthroid dose to 172mcg/d=> TSH= 4.37     DJD, knee pain, LBP>  Followed by [name]- ?torn meniscus in right knee, treated w/ brace & anti-inflamm meds, was supposed to get MRI but improved & held-off; Ortho is following...     VitD defic>  she stopped her 50K  supplement on her own; VitD level today= 43 & rec to continue Calcium, Women's MVI, VitD 2000u OTC supplement daily; BMD w/ osteopenia...     Borderline anemia>  Hg=12.2, MCV=87... rec to take Women'sMVI daily...   Patient's Medications  New Prescriptions   No medications on file  Previous Medications   CHLORPHENIRAMINE-HYDROCODONE (TUSSIONEX PENNKINETIC ER) 10-8 MG/5ML LQCR    Take 5 mLs by mouth every 12 (twelve) hours as needed.   CHOLECALCIFEROL (HM VITAMIN D3) 4000 UNITS CAPS    Take 1 capsule by mouth daily.   MULTIPLE VITAMINS-MINERALS (MULTIVITAMIN & MINERAL PO)    Take 1 tablet by mouth daily.    Modified Medications   Modified Medication Previous Medication   ALBUTEROL (PROVENTIL HFA;VENTOLIN HFA) 108 (90 BASE) MCG/ACT INHALER albuterol (PROVENTIL HFA;VENTOLIN HFA) 108 (90 BASE) MCG/ACT inhaler      Inhale 2 puffs into the lungs every 6 (six) hours as needed.    Inhale 2 puffs into the lungs every 6 (six) hours as needed.   ALPRAZOLAM (XANAX) 0.5 MG TABLET ALPRAZolam (XANAX) 0.5 MG tablet      Take 1/2 to 1 tablet by mouth three times daily as needed for nerves    Take 1/2 to 1 tablet by mouth three times daily as needed for nerves   CYCLOBENZAPRINE (FLEXERIL) 10 MG TABLET cyclobenzaprine (FLEXERIL) 10 MG tablet      Take 1 tablet (10 mg total) by mouth 3 (three) times daily as  needed. For muscle spasms    Take 1 tablet (10 mg total) by mouth 3 (three) times daily as needed. For muscle spasms   HYDROCODONE-ACETAMINOPHEN (NORCO/VICODIN) 5-325 MG PER TABLET HYDROcodone-acetaminophen (NORCO/VICODIN) 5-325 MG per tablet      Take one tablet by mouth three times daily as needed    Take one tablet by mouth three times daily as needed   LEVOTHYROXINE (SYNTHROID, LEVOTHROID) 150 MCG TABLET levothyroxine (SYNTHROID, LEVOTHROID) 150 MCG tablet      TAKE 1 TABLET BY MOUTH DAILY    TAKE 1 TABLET BY MOUTH DAILY   METOPROLOL TARTRATE (LOPRESSOR) 25 MG TABLET metoprolol tartrate (LOPRESSOR) 25 MG  tablet      TAKE 1 TABLET BY MOUTH TWICE DAILY    TAKE 1 TABLET BY MOUTH TWICE DAILY  Discontinued Medications   AZITHROMYCIN (ZITHROMAX) 250 MG TABLET    Take as directed   HYDROCODONE-ACETAMINOPHEN (VICODIN) 5-500 MG PER TABLET    Take 1 tablet by mouth every 6 hours as needed for pain   ISOMETHEPTENE-ACETAMINOPHEN-DICHLORALPHENAZONE (MIDRIN) 65-325-100 MG CAPSULE    Take 1 capsule by mouth 4 (four) times daily as needed. For headache

## 2013-06-26 LAB — TSH: TSH: 4.37 u[IU]/mL (ref 0.35–5.50)

## 2013-07-16 ENCOUNTER — Telehealth: Payer: Self-pay | Admitting: Pulmonary Disease

## 2013-07-16 NOTE — Telephone Encounter (Signed)
Per SN---  Continue these--  Heating pad around her shoulders Call in flexeril 10 mg  #50  1 po tid May need HA eval by neuro if pain persists.

## 2013-07-16 NOTE — Telephone Encounter (Signed)
lmtcb x1 

## 2013-07-16 NOTE — Telephone Encounter (Signed)
Pt is aware of SN recs.

## 2013-07-16 NOTE — Telephone Encounter (Signed)
Pt states she has had a headache since Sunday. She states she has taken hydrocodone, extra strength tylenol, Excedrin migraine, and flexeril because she feels tension in her shoulders and neck. None of these meds have helped. Pt denies any N/V, some light sensitivity.  Please advise. Debra Watkins, CMA Allergies  Allergen Reactions  . Penicillins     REACTION: rash  . Tetanus Toxoid     REACTION: hives

## 2013-09-04 ENCOUNTER — Telehealth: Payer: Self-pay | Admitting: Pulmonary Disease

## 2013-09-04 MED ORDER — LEVOFLOXACIN 500 MG PO TABS: 500.0000 mg | ORAL_TABLET | Freq: Every day | ORAL | Status: DC

## 2013-09-04 MED ORDER — FIRST-DUKES MOUTHWASH MT SUSP: OROMUCOSAL | Status: DC

## 2013-09-04 NOTE — Telephone Encounter (Signed)
Per SN---  levaquin 500 mg  #7  1 daily Align once daily MMW #4oz   1 tsp gargle and swallow four times daily as needed  otc delsym  2 tsp bid Is she insists---she can pick up the rx for hycodan for cough.

## 2013-09-04 NOTE — Telephone Encounter (Signed)
Called spoke with patient, advised of SN's recs as stated below.  Pt okay with these recommendations and verbalized her understanding.  Pt denied any questions/concerns at this time.  Levaquin and MMW sent to verified pharmacy.  Pt aware the Align and Delsym are otc.  If pt's symptoms do not improve or worsen, she will call back.  Nothing further needed at this time; will sign off.

## 2013-09-04 NOTE — Telephone Encounter (Signed)
Last OV 06-25-13. Pt states she went on a cruise x 2 weeks ago and developed bronchitis. She states she had a zpak on hold at pharmacy so she took this and also had some cough syrup as well. Pt states the zpak helped with the chest congestion and productive cough, but she is still having a bad sore throat. She states she is also having a dry cough, but the sore throat is the worse problem. She denies any fever, and she had her tonsils removed as a child. Pt is requesting a refill on cough medication as well as any other recs SN may have. Please advise. Debra CurieJennifer Rishit Watkins, CMA Allergies  Allergen Reactions  . Penicillins     REACTION: rash  . Tetanus Toxoid     REACTION: hives

## 2013-10-07 ENCOUNTER — Telehealth: Payer: Self-pay | Admitting: Pulmonary Disease

## 2013-10-07 NOTE — Telephone Encounter (Signed)
I called and made pt aware. Nothing further needed 

## 2013-10-07 NOTE — Telephone Encounter (Signed)
Please let the pt know that we can refer her to any of the [place] offices that provide primary care.  Which one would she like to be referred to?

## 2014-05-16 ENCOUNTER — Other Ambulatory Visit: Payer: Self-pay

## 2014-06-25 ENCOUNTER — Ambulatory Visit (INDEPENDENT_AMBULATORY_CARE_PROVIDER_SITE_OTHER): Payer: 59 | Admitting: Internal Medicine

## 2014-06-25 ENCOUNTER — Other Ambulatory Visit (INDEPENDENT_AMBULATORY_CARE_PROVIDER_SITE_OTHER): Payer: 59

## 2014-06-25 ENCOUNTER — Ambulatory Visit: Payer: BC Managed Care – PPO | Admitting: Pulmonary Disease

## 2014-06-25 ENCOUNTER — Encounter: Payer: Self-pay | Admitting: Internal Medicine

## 2014-06-25 VITALS — BP 118/84 | HR 70 | Temp 97.9°F | Ht 63.5 in | Wt 248.5 lb

## 2014-06-25 DIAGNOSIS — Z0000 Encounter for general adult medical examination without abnormal findings: Secondary | ICD-10-CM

## 2014-06-25 DIAGNOSIS — R739 Hyperglycemia, unspecified: Secondary | ICD-10-CM

## 2014-06-25 DIAGNOSIS — Z23 Encounter for immunization: Secondary | ICD-10-CM

## 2014-06-25 DIAGNOSIS — E663 Overweight: Secondary | ICD-10-CM

## 2014-06-25 DIAGNOSIS — E890 Postprocedural hypothyroidism: Secondary | ICD-10-CM

## 2014-06-25 LAB — CBC WITH DIFFERENTIAL/PLATELET
Basophils Absolute: 0 K/uL (ref 0.0–0.1)
Basophils Relative: 0.5 % (ref 0.0–3.0)
Eosinophils Absolute: 0.3 K/uL (ref 0.0–0.7)
Eosinophils Relative: 5.3 % — ABNORMAL HIGH (ref 0.0–5.0)
HCT: 40 % (ref 36.0–46.0)
Hemoglobin: 13 g/dL (ref 12.0–15.0)
Lymphocytes Relative: 21.5 % (ref 12.0–46.0)
Lymphs Abs: 1.2 K/uL (ref 0.7–4.0)
MCHC: 32.6 g/dL (ref 30.0–36.0)
MCV: 88.8 fl (ref 78.0–100.0)
Monocytes Absolute: 0.4 K/uL (ref 0.1–1.0)
Monocytes Relative: 6.3 % (ref 3.0–12.0)
Neutro Abs: 3.7 K/uL (ref 1.4–7.7)
Neutrophils Relative %: 66.4 % (ref 43.0–77.0)
Platelets: 229 K/uL (ref 150.0–400.0)
RBC: 4.5 Mil/uL (ref 3.87–5.11)
RDW: 14.3 % (ref 11.5–15.5)
WBC: 5.6 K/uL (ref 4.0–10.5)

## 2014-06-25 LAB — BASIC METABOLIC PANEL WITH GFR
BUN: 14 mg/dL (ref 6–23)
CO2: 29 meq/L (ref 19–32)
Calcium: 9.1 mg/dL (ref 8.4–10.5)
Chloride: 102 meq/L (ref 96–112)
Creatinine, Ser: 0.8 mg/dL (ref 0.4–1.2)
GFR: 77.29 mL/min
Glucose, Bld: 109 mg/dL — ABNORMAL HIGH (ref 70–99)
Potassium: 4.6 meq/L (ref 3.5–5.1)
Sodium: 138 meq/L (ref 135–145)

## 2014-06-25 LAB — URINALYSIS, ROUTINE W REFLEX MICROSCOPIC
Bilirubin Urine: NEGATIVE
Hgb urine dipstick: NEGATIVE
Ketones, ur: NEGATIVE
Nitrite: NEGATIVE
Specific Gravity, Urine: 1.02
Total Protein, Urine: NEGATIVE
Urine Glucose: NEGATIVE
Urobilinogen, UA: 0.2
pH: 7.5 (ref 5.0–8.0)

## 2014-06-25 LAB — LIPID PANEL
Cholesterol: 186 mg/dL (ref 0–200)
HDL: 48.9 mg/dL
LDL Cholesterol: 116 mg/dL — ABNORMAL HIGH (ref 0–99)
NonHDL: 137.1
Total CHOL/HDL Ratio: 4
Triglycerides: 105 mg/dL (ref 0.0–149.0)
VLDL: 21 mg/dL (ref 0.0–40.0)

## 2014-06-25 LAB — HEPATIC FUNCTION PANEL
ALT: 17 U/L (ref 0–35)
AST: 22 U/L (ref 0–37)
Albumin: 3.9 g/dL (ref 3.5–5.2)
Alkaline Phosphatase: 84 U/L (ref 39–117)
Bilirubin, Direct: 0.1 mg/dL (ref 0.0–0.3)
Total Bilirubin: 0.6 mg/dL (ref 0.2–1.2)
Total Protein: 7.2 g/dL (ref 6.0–8.3)

## 2014-06-25 LAB — TSH: TSH: 14.18 u[IU]/mL — ABNORMAL HIGH (ref 0.35–4.50)

## 2014-06-25 LAB — HEMOGLOBIN A1C: Hgb A1c MFr Bld: 6.6 % — ABNORMAL HIGH (ref 4.6–6.5)

## 2014-06-25 MED ORDER — METOPROLOL TARTRATE 25 MG PO TABS: 25.0000 mg | ORAL_TABLET | Freq: Two times a day (BID) | ORAL | Status: DC

## 2014-06-25 MED ORDER — ALPRAZOLAM 0.5 MG PO TABS: 0.2500 mg | ORAL_TABLET | Freq: Three times a day (TID) | ORAL | Status: DC | PRN

## 2014-06-25 MED ORDER — HYDROCODONE-ACETAMINOPHEN 5-325 MG PO TABS: 1.0000 | ORAL_TABLET | Freq: Three times a day (TID) | ORAL | Status: DC | PRN

## 2014-06-25 MED ORDER — LEVOTHYROXINE SODIUM 150 MCG PO TABS: 150.0000 ug | ORAL_TABLET | Freq: Every day | ORAL | Status: DC

## 2014-06-25 NOTE — Patient Instructions (Addendum)
It was good to see you today.  We have reviewed your prior records including labs and tests today  Your annual flu shot was given and/or updated today.  Other Health Maintenance reviewed - let me know if you need referral for Pap smear or mammogram  -other recommended immunizations and age-appropriate screenings are up-to-date.  Test(s) ordered today. Your results will be released to [place] (or called to you) after review, usually within 72hours after test completion. If any changes need to be made, you will be notified at that same time.  Medications reviewed and updated, no changes recommended at this time. Refill on medication(s) as discussed today.  Work on lifestyle changes as discussed (low fat, low carb, increased protein diet; improved exercise efforts; weight loss) to control sugar, blood pressure and cholesterol levels and/or reduce risk of developing other medical problems. Look into [email or web address].[email or web address] or other type of food journal to assist you in this process.  Please schedule followup in 12 months for annual exam and labs, call sooner if problems.  Health Maintenance Adopting a healthy lifestyle and getting preventive care can go a long way to promote health and wellness. Talk with your health care provider about what schedule of regular examinations is right for you. This is a good chance for you to check in with your provider about disease prevention and staying healthy. In between checkups, there are plenty of things you can do on your own. Experts have done a lot of research about which lifestyle changes and preventive measures are most likely to keep you healthy. Ask your health care provider for more information. WEIGHT AND DIET  Eat a healthy diet  Be sure to include plenty of vegetables, fruits, low-fat dairy products, and lean protein.  Do not eat a lot of foods high in solid fats, added sugars, or salt.  Get regular exercise. This is one of the most important things  you can do for your health.  Most adults should exercise for at least 150 minutes each week. The exercise should increase your heart rate and make you sweat (moderate-intensity exercise).  Most adults should also do strengthening exercises at least twice a week. This is in addition to the moderate-intensity exercise.  Maintain a healthy weight  Body mass index (BMI) is a measurement that can be used to identify possible weight problems. It estimates body fat based on height and weight. Your health care provider can help determine your BMI and help you achieve or maintain a healthy weight.  For females 81 years of age and older:   A BMI below 18.5 is considered underweight.  A BMI of 18.5 to 24.9 is normal.  A BMI of 25 to 29.9 is considered overweight.  A BMI of 30 and above is considered obese.  Watch levels of cholesterol and blood lipids  You should start having your blood tested for lipids and cholesterol at 57 years of age, then have this test every 5 years.  You may need to have your cholesterol levels checked more often if:  Your lipid or cholesterol levels are high.  You are older than 57 years of age.  You are at high risk for heart disease.  CANCER SCREENING   Lung Cancer  Lung cancer screening is recommended for adults 88-25 years old who are at high risk for lung cancer because of a history of smoking.  A yearly low-dose CT scan of the lungs is recommended for people who:  Currently smoke.  Have quit within  the past 15 years.  Have at least a 30-pack-year history of smoking. A pack year is smoking an average of one pack of cigarettes a day for 1 year.  Yearly screening should continue until it has been 15 years since you quit.  Yearly screening should stop if you develop a health problem that would prevent you from having lung cancer treatment.  Breast Cancer  Practice breast self-awareness. This means understanding how your breasts normally appear  and feel.  It also means doing regular breast self-exams. Let your health care provider know about any changes, no matter how small.  If you are in your 20s or 30s, you should have a clinical breast exam (CBE) by a health care provider every 1-3 years as part of a regular health exam.  If you are 11 or older, have a CBE every year. Also consider having a breast X-ray (mammogram) every year.  If you have a family history of breast cancer, talk to your health care provider about genetic screening.  If you are at high risk for breast cancer, talk to your health care provider about having an MRI and a mammogram every year.  Breast cancer gene (BRCA) assessment is recommended for women who have family members with BRCA-related cancers. BRCA-related cancers include:  Breast.  Ovarian.  Tubal.  Peritoneal cancers.  Results of the assessment will determine the need for genetic counseling and BRCA1 and BRCA2 testing. Cervical Cancer Routine pelvic examinations to screen for cervical cancer are no longer recommended for nonpregnant women who are considered low risk for cancer of the pelvic organs (ovaries, uterus, and vagina) and who do not have symptoms. A pelvic examination may be necessary if you have symptoms including those associated with pelvic infections. Ask your health care provider if a screening pelvic exam is right for you.   The Pap test is the screening test for cervical cancer for women who are considered at risk.  If you had a hysterectomy for a problem that was not cancer or a condition that could lead to cancer, then you no longer need Pap tests.  If you are older than 65 years, and you have had normal Pap tests for the past 10 years, you no longer need to have Pap tests.  If you have had past treatment for cervical cancer or a condition that could lead to cancer, you need Pap tests and screening for cancer for at least 20 years after your treatment.  If you no longer get a  Pap test, assess your risk factors if they change (such as having a new sexual partner). This can affect whether you should start being screened again.  Some women have medical problems that increase their chance of getting cervical cancer. If this is the case for you, your health care provider may recommend more frequent screening and Pap tests.  The human papillomavirus (HPV) test is another test that may be used for cervical cancer screening. The HPV test looks for the virus that can cause cell changes in the cervix. The cells collected during the Pap test can be tested for HPV.  The HPV test can be used to screen women 74 years of age and older. Getting tested for HPV can extend the interval between normal Pap tests from three to five years.  An HPV test also should be used to screen women of any age who have unclear Pap test results.  After 57 years of age, women should have HPV testing as often  as Pap tests.  Colorectal Cancer  This type of cancer can be detected and often prevented.  Routine colorectal cancer screening usually begins at 57 years of age and continues through 57 years of age.  Your health care provider may recommend screening at an earlier age if you have risk factors for colon cancer.  Your health care provider may also recommend using home test kits to check for hidden blood in the stool.  A small camera at the end of a tube can be used to examine your colon directly (sigmoidoscopy or colonoscopy). This is done to check for the earliest forms of colorectal cancer.  Routine screening usually begins at age 63.  Direct examination of the colon should be repeated every 5-10 years through 57 years of age. However, you may need to be screened more often if early forms of precancerous polyps or small growths are found. Skin Cancer  Check your skin from head to toe regularly.  Tell your health care provider about any new moles or changes in moles, especially if there is  a change in a mole's shape or color.  Also tell your health care provider if you have a mole that is larger than the size of a pencil eraser.  Always use sunscreen. Apply sunscreen liberally and repeatedly throughout the day.  Protect yourself by wearing long sleeves, pants, a wide-brimmed hat, and sunglasses whenever you are outside. HEART DISEASE, DIABETES, AND HIGH BLOOD PRESSURE   Have your blood pressure checked at least every 1-2 years. High blood pressure causes heart disease and increases the risk of stroke.  If you are between 58 years and 36 years old, ask your health care provider if you should take aspirin to prevent strokes.  Have regular diabetes screenings. This involves taking a blood sample to check your fasting blood sugar level.  If you are at a normal weight and have a low risk for diabetes, have this test once every three years after 57 years of age.  If you are overweight and have a high risk for diabetes, consider being tested at a younger age or more often. PREVENTING INFECTION  Hepatitis B  If you have a higher risk for hepatitis B, you should be screened for this virus. You are considered at high risk for hepatitis B if:  You were born in a country where hepatitis B is common. Ask your health care provider which countries are considered high risk.  Your parents were born in a high-risk country, and you have not been immunized against hepatitis B (hepatitis B vaccine).  You have HIV or AIDS.  You use needles to inject street drugs.  You live with someone who has hepatitis B.  You have had sex with someone who has hepatitis B.  You get hemodialysis treatment.  You take certain medicines for conditions, including cancer, organ transplantation, and autoimmune conditions. Hepatitis C  Blood testing is recommended for:  Everyone born from [birth year] through 1964.  Anyone with known risk factors for hepatitis C. Sexually transmitted infections (STIs)  You  should be screened for sexually transmitted infections (STIs) including gonorrhea and chlamydia if:  You are sexually active and are younger than 57 years of age.  You are older than 57 years of age and your health care provider tells you that you are at risk for this type of infection.  Your sexual activity has changed since you were last screened and you are at an increased risk for chlamydia or gonorrhea. Ask  your health care provider if you are at risk.  If you do not have HIV, but are at risk, it may be recommended that you take a prescription medicine daily to prevent HIV infection. This is called pre-exposure prophylaxis (PrEP). You are considered at risk if:  You are sexually active and do not regularly use condoms or know the HIV status of your partner(s).  You take drugs by injection.  You are sexually active with a partner who has HIV. Talk with your health care provider about whether you are at high risk of being infected with HIV. If you choose to begin PrEP, you should first be tested for HIV. You should then be tested every 3 months for as long as you are taking PrEP.  PREGNANCY   If you are premenopausal and you may become pregnant, ask your health care provider about preconception counseling.  If you may become pregnant, take 400 to 800 micrograms (mcg) of folic acid every day.  If you want to prevent pregnancy, talk to your health care provider about birth control (contraception). OSTEOPOROSIS AND MENOPAUSE   Osteoporosis is a disease in which the bones lose minerals and strength with aging. This can result in serious bone fractures. Your risk for osteoporosis can be identified using a bone density scan.  If you are 34 years of age or older, or if you are at risk for osteoporosis and fractures, ask your health care provider if you should be screened.  Ask your health care provider whether you should take a calcium or vitamin D supplement to lower your risk for  osteoporosis.  Menopause may have certain physical symptoms and risks.  Hormone replacement therapy may reduce some of these symptoms and risks. Talk to your health care provider about whether hormone replacement therapy is right for you.  HOME CARE INSTRUCTIONS   Schedule regular health, dental, and eye exams.  Stay current with your immunizations.   Do not use any tobacco products including cigarettes, chewing tobacco, or electronic cigarettes.  If you are pregnant, do not drink alcohol.  If you are breastfeeding, limit how much and how often you drink alcohol.  Limit alcohol intake to no more than 1 drink per day for nonpregnant women. One drink equals 12 ounces of beer, 5 ounces of wine, or 1 ounces of hard liquor.  Do not use street drugs.  Do not share needles.  Ask your health care provider for help if you need support or information about quitting drugs.  Tell your health care provider if you often feel depressed.  Tell your health care provider if you have ever been abused or do not feel safe at home. Document Released: 01/31/2011 Document Revised: 12/02/2013 Document Reviewed: 06/19/2013 [facility] Patient Information 2015 [place], [place]. This information is not intended to replace advice given to you by your health care provider. Make sure you discuss any questions you have with your health care provider.

## 2014-06-25 NOTE — Progress Notes (Signed)
Pre visit review using our clinic review tool, if applicable. No additional management support is needed unless otherwise documented below in the visit note. 

## 2014-06-25 NOTE — Assessment & Plan Note (Signed)
Wt Readings from Last 3 Encounters:  06/25/14 248 lb 8 oz (112.719 kg)  06/25/13 239 lb 6.4 oz (108.591 kg)  05/09/13 240 lb (108.863 kg)   The patient is asked to make an attempt to improve diet and exercise patterns to aid in medical management of this problem.

## 2014-06-25 NOTE — Assessment & Plan Note (Signed)
I-131 radiation April 2009 reviewed. On replacement therapy since Asymptomatic Check TSH and adjust dose as needed Lab Results  Component Value Date   TSH 4.37 06/25/2013

## 2014-06-25 NOTE — Assessment & Plan Note (Signed)
Treatments and gain reviewed, education on diet and importance of aerobic exercise Last A1c 6.4, recheck now as family history for diabetes is unknown Lab Results  Component Value Date   HGBA1C 6.4 06/25/2013

## 2014-06-25 NOTE — Progress Notes (Signed)
Subjective:    Patient ID: Debra Watkins, female    DOB: [birth year], 58 y.o.   MRN: [id number]  HPI Transfer to me from [facility] to establish with new PCP  patient is here today for annual physical. Patient feels well and has no complaints.  Also reviewed chronic medical issues and interval medical events  Past Medical History  Diagnosis Date  . Pulmonary nodule     ?4mm LLL on CXR 09/19/08, not seen on CXR f/u x 5 since  . Palpitations     related to hyperthyroidism  . Hypercholesteremia   . Hyperthyroidism     s/p I-131 ablation 10/2007  . Hypothyroidism     post radiation  . Overweight(278.02)   . Lumbar back pain   . Vitamin D deficiency   . Headache(784.0)   . Anxiety   . Unspecified deficiency anemia    Family History  Problem Relation Age of Onset  . Adopted: Yes   History  Substance Use Topics  . Smoking status: Never Smoker   . Smokeless tobacco: Not on file  . Alcohol Use: Yes     Comment: social use    Review of Systems  Constitutional: Negative for fatigue and unexpected weight change.  Respiratory: Negative for cough, shortness of breath and wheezing.   Cardiovascular: Negative for chest pain, palpitations and leg swelling.  Gastrointestinal: Negative for nausea, abdominal pain and diarrhea.  Musculoskeletal: Positive for arthralgias (knees L>R, low back - chronic w/o change). Negative for myalgias and joint swelling.  Neurological: Negative for dizziness, weakness, light-headedness and headaches.  Psychiatric/Behavioral: Negative for dysphoric mood. The patient is not nervous/anxious.   All other systems reviewed and are negative.      Objective:   Physical Exam  BP 118/84 mmHg  Pulse 70  Temp(Src) 97.9 F (36.6 C) (Oral)  Ht 5' 3.5" (1.613 m)  Wt 248 lb 8 oz (112.719 kg)  BMI 43.32 kg/m2  SpO2 96% Wt Readings from Last 3 Encounters:  06/25/14 248 lb 8 oz (112.719 kg)  06/25/13 239 lb 6.4 oz (108.591 kg)  05/09/13 240 lb (108.863 kg)     Constitutional: She is obese, appears well-developed and well-nourished. No distress.  HENT: Head: Normocephalic and atraumatic. Ears: B TMs ok, no erythema or effusion; Nose: Nose normal. Mouth/Throat: Oropharynx is clear and moist. No oropharyngeal exudate.  Eyes: Weas corr lenses. Conjunctivae and EOM are normal. Pupils are equal, round, and reactive to light. No scleral icterus.  Neck: Normal range of motion. Neck supple. No JVD present. No thyromegaly present.  Cardiovascular: Normal rate, regular rhythm and normal heart sounds.  No murmur heard. No BLE edema. Pulmonary/Chest: Effort normal and breath sounds normal. No respiratory distress. She has no wheezes.  Abdominal: Soft. Bowel sounds are normal. She exhibits no distension. There is no tenderness. no masses GU/breast: defer to gyn Musculoskeletal: Normal range of motion, no joint effusions. No gross deformities Neurological: She is alert and oriented to person, place, and time. No cranial nerve deficit. Coordination, balance, strength, speech and gait are normal.  Skin: Skin is warm and dry. No rash noted. No erythema.  Psychiatric: She has a normal mood and affect. Her behavior is normal. Judgment and thought content normal.    Lab Results  Component Value Date   WBC 5.6 06/25/2013   HGB 12.2 06/25/2013   HCT 36.9 06/25/2013   PLT 250.0 06/25/2013   GLUCOSE 108* 06/25/2013   CHOL 177 06/25/2013   TRIG 90.0 06/25/2013  HDL 57.70 06/25/2013   LDLDIRECT 121.2 05/07/2012   LDLCALC 101* 06/25/2013   ALT 25 06/25/2013   AST 24 06/25/2013   NA 137 06/25/2013   K 4.5 06/25/2013   CL 102 06/25/2013   CREATININE 0.7 06/25/2013   BUN 13 06/25/2013   CO2 28 06/25/2013   TSH 4.37 06/25/2013   HGBA1C 6.4 06/25/2013    Dg Chest 2 View  06/25/2013   CLINICAL DATA:  Physical exam  EXAM: CHEST  2 VIEW  COMPARISON:  Chest x-ray of 05/07/2012  FINDINGS: No active infiltrate or effusion is seen. Mediastinal contours are stable.  The heart is borderline enlarged and stable. Mild degenerative spurring is noted in the lower thoracic spine.  IMPRESSION: Stable chest x-ray with borderline cardiomegaly. No active lung disease.   Electronically Signed   By: Dwyane DeePaul  Barry M.D.   On: 06/25/2013 15:20       Assessment & Plan:   CPX/z00.00 - Patient has been counseled on age-appropriate routine health concerns for screening and prevention. These are reviewed and up-to-date. Immunizations are up-to-date or declined. Labs ordered and reviewed.  Problem List Items Addressed This Visit    Hyperglycemia    Treatments and gain reviewed, education on diet and importance of aerobic exercise Last A1c 6.4, recheck now as family history for diabetes is unknown Lab Results  Component Value Date   HGBA1C 6.4 06/25/2013       Relevant Orders      Hemoglobin A1c   Hypothyroidism following radioiodine therapy    I-131 radiation April 2009 reviewed. On replacement therapy since Asymptomatic Check TSH and adjust dose as needed Lab Results  Component Value Date   TSH 4.37 06/25/2013       Relevant Medications      levothyroxine (SYNTHROID, LEVOTHROID) tablet      metoprolol tartrate (LOPRESSOR) tablet   Overweight    Wt Readings from Last 3 Encounters:  06/25/14 248 lb 8 oz (112.719 kg)  06/25/13 239 lb 6.4 oz (108.591 kg)  05/09/13 240 lb (108.863 kg)   The patient is asked to make an attempt to improve diet and exercise patterns to aid in medical management of this problem.      Other Visit Diagnoses    Routine general medical examination at a health care facility    -  Primary    Relevant Orders       Basic metabolic panel       CBC with Differential       Hepatic function panel       Lipid panel       TSH       Urinalysis, Routine w reflex microscopic

## 2014-07-09 ENCOUNTER — Telehealth: Payer: Self-pay | Admitting: Internal Medicine

## 2014-07-09 NOTE — Telephone Encounter (Signed)
Pt called stated that she didn't get the refill for Flexeril and albuterol on 06/25/14. Please check,pt do not have the hard copy.

## 2014-07-10 MED ORDER — CYCLOBENZAPRINE HCL 10 MG PO TABS: 10.0000 mg | ORAL_TABLET | Freq: Three times a day (TID) | ORAL | Status: DC | PRN

## 2014-07-10 MED ORDER — ALBUTEROL SULFATE HFA 108 (90 BASE) MCG/ACT IN AERS
2.0000 | INHALATION_SPRAY | Freq: Four times a day (QID) | RESPIRATORY_TRACT | Status: DC | PRN
Start: 2014-07-10 — End: 2015-06-29

## 2014-07-10 NOTE — Telephone Encounter (Signed)
rx faxed to pharm

## 2014-07-10 NOTE — Telephone Encounter (Signed)
Ok to refill as pt changed from [facility] to me as PCP- erx done

## 2014-11-10 ENCOUNTER — Other Ambulatory Visit: Payer: Self-pay | Admitting: Internal Medicine

## 2014-11-11 NOTE — Telephone Encounter (Signed)
done

## 2014-11-11 NOTE — Telephone Encounter (Signed)
Faxed script back to [facility].../lmb 

## 2014-11-11 NOTE — Telephone Encounter (Signed)
MD out of office pls advise on refill.../lmb 

## 2015-01-16 ENCOUNTER — Encounter: Payer: Self-pay | Admitting: Internal Medicine

## 2015-01-16 ENCOUNTER — Ambulatory Visit (INDEPENDENT_AMBULATORY_CARE_PROVIDER_SITE_OTHER): Payer: 59 | Admitting: Internal Medicine

## 2015-01-16 ENCOUNTER — Telehealth: Payer: Self-pay

## 2015-01-16 VITALS — BP 150/90 | HR 79 | Temp 98.0°F | Resp 16 | Wt 246.0 lb

## 2015-01-16 DIAGNOSIS — J209 Acute bronchitis, unspecified: Secondary | ICD-10-CM

## 2015-01-16 DIAGNOSIS — J45909 Unspecified asthma, uncomplicated: Secondary | ICD-10-CM | POA: Insufficient documentation

## 2015-01-16 MED ORDER — AZITHROMYCIN 250 MG PO TABS: ORAL_TABLET | ORAL | Status: DC

## 2015-01-16 MED ORDER — HYDROCODONE-HOMATROPINE 5-1.5 MG/5ML PO SYRP: 5.0000 mL | ORAL_SOLUTION | Freq: Four times a day (QID) | ORAL | Status: DC | PRN

## 2015-01-16 NOTE — Progress Notes (Signed)
   Subjective:    Patient ID: Debra Watkins, female    DOB: 08/01/1956, 59 y.o.   MRN: [id number]  HPI  Her symptoms began 12/30/14 as sore throat with rapid progression to cough productive of green sputum. She has also had some green nasal discharge but of lesser volume. She describes continued irritated throat. The cough is disturbing sleep. She has taken Delsym, NyQuil, DayQuil,& Mucinex DM with some improvement.  Significant past history includes asthma as a child. She does have some bronchospasm with infections. She's never smoked.  She does not have other upper respiratory tract infection symptoms. There is no associated shortness of breath or wheezing with the cough. She does describe some sneezing but no other extrinsic symptoms.  Review of Systems  Frontal headache, facial pain , nasal purulence, dental pain, sore throat , otic pain or otic discharge denied. No fever , chills or sweats.     Objective:   Physical Exam   Pertinent or positive findings include : She has dramatic resting exotropia of the right eye. There is mild erythema of the nasal mucosa. There is a minute scar on the right tympanic membrane. Breath sounds are generally decreased without wheezing or rhonchi.Some ankle edema.BMI: 42.89.  General appearance:Adequately nourished; no acute distress or increased work of breathing is present.    Lymphatic: No  lymphadenopathy about the head, neck, or axilla .  Eyes: No conjunctival inflammation or lid edema is present. There is no scleral icterus.  Ears:  External ear exam shows no significant lesions or deformities.  Otoscopic examination reveals clear canals, tympanic membranes are intact bilaterally without bulging, retraction, inflammation or discharge.  Nose:  External nasal examination shows no deformity or inflammation. No septal dislocation or deviation.No obstruction to airflow.   Oral exam: Dental hygiene is good; lips and gums are healthy  appearing.There is no oropharyngeal erythema or exudate .  Neck:  No deformities, thyromegaly, masses, or tenderness noted.   Supple with full range of motion without pain.   Heart:  Normal rate and regular rhythm. S1 and S2 normal without gallop, murmur, click, rub or other extra sounds.   Lungs:Chest clear to auscultation; no wheezes, rhonchi,rales ,or rubs present.  Extremities:  No cyanosis or clubbing  noted    Skin: Warm & dry w/o tenting or jaundice. No significant lesions or rash.       Assessment & Plan:  #1 acute bronchitis w/o bronchospasm #2 URI, acute Plan: See orders and recommendations

## 2015-01-16 NOTE — Progress Notes (Signed)
Pre visit review using our clinic review tool, if applicable. No additional management support is needed unless otherwise documented below in the visit note. 

## 2015-01-16 NOTE — Patient Instructions (Signed)

## 2015-01-16 NOTE — Telephone Encounter (Signed)
[facility] - Client TELEPHONE ADVICE RECORD [facility] Patient Name: Debra Watkins Gender: Female DOB: 01/19/1956 Age: 59 Y 2 M 1 D Return Phone Number: [phone] (Primary) Address: City/State/Zip:  Client [facility] - Client Client [facility] Physician Leschber, Valerie Contact Type Call Call Type Triage / Clinical Relationship To Patient Self Appointment Disposition EMR Appointment Scheduled Info pasted into Epic Yes Return Phone Number [phone] (Primary) Chief Complaint Cough Initial Comment Caller states she has congestion and coughing up mucous for last 8 days PreDisposition Home Care Nurse Assessment Nurse: Trumbull, RN, Cathy Date/Time (Eastern Time): 01/14/2015 2:11:22 PM Confirm and document reason for call. If symptomatic, describe symptoms. ---Caller states she developed cough/cold symptoms about 8 days ago. No fever. Has the patient traveled out of the country within the last 30 days? ---No Does the patient require triage? ---Yes Related visit to physician within the last 2 weeks? ---Yes Does the PT have any chronic conditions? (i.e. diabetes, asthma, etc.) ---Yes List chronic conditions. ---Bronchitis in the past, Thyroid problems Guidelines Guideline Title Affirmed Question Affirmed Notes Nurse Date/Time (Eastern Time) Cough - Acute Productive SEVERE coughing spells (e.g., whooping sound after coughing, vomiting after coughing) Trumbull, RN, Cathy 01/14/2015 2:13:04 PM Disp. Time (Eastern Time) Disposition Final User 01/14/2015 2:15:37 PM See Physician within 24 Hours Yes Trumbull, RN, Cathy Caller Understands: Yes Disagree/Comply: Comply PLEASE NOTE: All timestamps contained within this report are represented as Eastern Standard Time. CONFIDENTIALTY NOTICE: This fax transmission is intended only for the addressee. It contains information that is legally  privileged, confidential or otherwise protected from use or disclosure. If you are not the intended recipient, you are strictly prohibited from reviewing, disclosing, copying using or disseminating any of this information or taking any action in reliance on or regarding this information. If you have received this fax in error, please notify us immediately by telephone so that we can arrange for its return to us. Phone: [phone], Toll-Free: [phone], Fax: [phone] Page: 2 of 2 Call Id: [id number] Care Advice Given Per Guideline SEE PHYSICIAN WITHIN 24 HOURS: *IF OFFICE WILL BE OPEN: You need to be examined within the next 24 hours. Call your doctor when the office opens, and make an appointment. COUGHING SPELLS: * Drink warm fluids. Inhale warm mist. (Reason: both relax the airway and loosen up the phlegm) * Suck on cough drops or hard candy to coat the irritated throat. HUMIDIFIER: If the air is dry, use a humidifier in the bedroom. (Reason: dry air makes coughs worse) AVOID TOBACCO SMOKE: Smoking or being exposed to smoke makes coughs much worse. VOMITING WITH COUGHING SPELLS: Eat smaller amounts with meal and snack to reduce the chances of repeated vomiting. (Reason: vomiting is more likely with a full stomach) CALL BACK IF: * Difficulty breathing occurs * You become worse. CARE ADVICE given per Cough - Acute Productive (Adult) guideline. After Care Instructions Given Call Event Type User Date / Time Description Comments User: Cathy, Trumbull, RN Date/Time (Eastern Time): 01/14/2015 2:22:34 PM Attempted to schedule within the next 24 hours per triage disposition. 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 Scheduled appointment with Dr. Hopper on 01/16/15 at 8am. Encouraged to call back as needed Referrals REFERRED TO PCP OFFICE 

## 2015-01-20 ENCOUNTER — Telehealth: Payer: Self-pay | Admitting: *Deleted

## 2015-01-20 ENCOUNTER — Other Ambulatory Visit: Payer: Self-pay | Admitting: Emergency Medicine

## 2015-01-20 ENCOUNTER — Telehealth: Payer: Self-pay | Admitting: Internal Medicine

## 2015-01-20 DIAGNOSIS — J209 Acute bronchitis, unspecified: Secondary | ICD-10-CM

## 2015-01-20 MED ORDER — AZITHROMYCIN 250 MG PO TABS
ORAL_TABLET | ORAL | Status: DC
Start: 2015-01-20 — End: 2015-06-29

## 2015-01-20 NOTE — Telephone Encounter (Signed)
Azithromycin #6 ; 1 qd X 6 days  Not 2 day 1

## 2015-01-20 NOTE — Telephone Encounter (Signed)
Patient was in last week with bronchitis and she finished her last dose of antibiotic today. She feels better but she says it feels like its still there. She is wondering if she should have another round of antibiotic. Please advise

## 2015-01-20 NOTE — Telephone Encounter (Signed)
[facility] - Client TELEPHONE ADVICE RECORD [facility] Patient Name: Debra Watkins Gender: Female DOB: 1955/12/15 Age: 59 Y 2 M 1 D Return Phone Number: [phone] (Primary) Address: City/State/Zip: [place] Client [facility] - Client Client Site [facility] Physician Rene Paci Contact Type Call Call Type Triage / Clinical Relationship To Patient Self Appointment Disposition EMR Appointment Scheduled Info pasted into Epic Yes Return Phone Number [phone] (Primary) Chief Complaint Cough Initial Comment Caller states she has congestion and coughing up mucous for last 8 days PreDisposition Home Care Nurse Assessment Nurse: Charna Elverta, RN, Lynden Ang Date/Time Lamount Cohen Time): 01/14/2015 2:11:22 PM Confirm and document reason for call. If symptomatic, describe symptoms. ---Caller states she developed cough/cold symptoms about 8 days ago. No fever. Has the patient traveled out of the country within the last 30 days? ---No Does the patient require triage? ---Yes Related visit to physician within the last 2 weeks? ---Yes Does the PT have any chronic conditions? (i.e. diabetes, asthma, etc.) ---Yes List chronic conditions. ---Bronchitis in the past, Thyroid problems Guidelines Guideline Title Affirmed Question Affirmed Notes Nurse Date/Time Lamount Cohen Time) Cough - Acute Productive SEVERE coughing spells (e.g., whooping sound after coughing, vomiting after coughing) Trumbull, RN, Cathy 01/14/2015 2:13:04 PM Disp. Time Lamount Cohen Time) Disposition Final User 01/14/2015 2:15:37 PM See Physician within 24 Hours Yes Trumbull, RN, Frann Rider Understands: Yes Disagree/Comply: Comply PLEASE NOTE: All timestamps contained within this report are represented as [place] Standard Time. CONFIDENTIALTY NOTICE: This fax transmission is intended only for the addressee. It contains information that is legally  privileged, confidential or otherwise protected from use or disclosure. If you are not the intended recipient, you are strictly prohibited from reviewing, disclosing, copying using or disseminating any of this information or taking any action in reliance on or regarding this information. If you have received this fax in error, please notify us immediately by telephone so that we can arrange for its return to [place]. Phone: [phone], Toll-Free: [phone], Fax: [phone] Page: 2 of 2 Call Id: [id number] Care Advice Given Per Guideline SEE PHYSICIAN WITHIN 24 HOURS: *IF OFFICE WILL BE OPEN: You need to be examined within the next 24 hours. Call your doctor when the office opens, and make an appointment. COUGHING SPELLS: * Drink warm fluids. Inhale warm mist. (Reason: both relax the airway and loosen up the phlegm) * Suck on cough drops or hard candy to coat the irritated throat. HUMIDIFIER: If the air is dry, use a humidifier in the bedroom. (Reason: dry air makes coughs worse) AVOID TOBACCO SMOKE: Smoking or being exposed to smoke makes coughs much worse. VOMITING WITH COUGHING SPELLS: Eat smaller amounts with meal and snack to reduce the chances of repeated vomiting. (Reason: vomiting is more likely with a full stomach) CALL BACK IF: * Difficulty breathing occurs * You become worse. CARE ADVICE given per Cough - Acute Productive (Adult) guideline. After Care Instructions Given Call Event Type User Date / Time Description Comments User: Colonel Bald, RN Date/Time Lamount Cohen Time): 01/14/2015 2:22:34 PM Attempted to schedule within the next 24 hours per triage disposition. 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 Scheduled appointment with Dr. Alwyn Ren on 01/16/15 at 8am. Encouraged to call back as needed Referrals REFERRED TO PCP OFFICE

## 2015-01-20 NOTE — Telephone Encounter (Signed)
Please advise 

## 2015-06-23 ENCOUNTER — Encounter: Payer: Self-pay | Admitting: Internal Medicine

## 2015-06-29 ENCOUNTER — Ambulatory Visit (INDEPENDENT_AMBULATORY_CARE_PROVIDER_SITE_OTHER): Payer: 59 | Admitting: Internal Medicine

## 2015-06-29 ENCOUNTER — Other Ambulatory Visit: Payer: Self-pay

## 2015-06-29 ENCOUNTER — Encounter: Payer: Self-pay | Admitting: Internal Medicine

## 2015-06-29 ENCOUNTER — Other Ambulatory Visit (INDEPENDENT_AMBULATORY_CARE_PROVIDER_SITE_OTHER): Payer: 59

## 2015-06-29 VITALS — BP 130/88 | HR 73 | Temp 97.7°F | Ht 63.5 in | Wt 248.0 lb

## 2015-06-29 DIAGNOSIS — R739 Hyperglycemia, unspecified: Secondary | ICD-10-CM | POA: Diagnosis not present

## 2015-06-29 DIAGNOSIS — E559 Vitamin D deficiency, unspecified: Secondary | ICD-10-CM | POA: Diagnosis not present

## 2015-06-29 DIAGNOSIS — Z0000 Encounter for general adult medical examination without abnormal findings: Secondary | ICD-10-CM

## 2015-06-29 DIAGNOSIS — E890 Postprocedural hypothyroidism: Secondary | ICD-10-CM

## 2015-06-29 DIAGNOSIS — Z23 Encounter for immunization: Secondary | ICD-10-CM | POA: Diagnosis not present

## 2015-06-29 LAB — URINALYSIS, ROUTINE W REFLEX MICROSCOPIC
BILIRUBIN URINE: NEGATIVE
HGB URINE DIPSTICK: NEGATIVE
KETONES UR: NEGATIVE
NITRITE: NEGATIVE
PH: 5.5 (ref 5.0–8.0)
Specific Gravity, Urine: >=1.03 — AB (ref 1.000–1.030)
Total Protein, Urine: NEGATIVE
UROBILINOGEN UA: 0.2 (ref 0.0–1.0)
Urine Glucose: NEGATIVE

## 2015-06-29 LAB — LIPID PANEL
CHOLESTEROL: 178 mg/dL (ref 0–200)
HDL: 49.8 mg/dL (ref >39.00)
LDL Cholesterol: 103 mg/dL — ABNORMAL HIGH (ref 0–99)
NONHDL: 128.12
TRIGLYCERIDES: 128 mg/dL (ref 0.0–149.0)
Total CHOL/HDL Ratio: 4
VLDL: 25.6 mg/dL (ref 0.0–40.0)

## 2015-06-29 LAB — CBC WITH DIFFERENTIAL/PLATELET
BASOS ABS: 0 10*3/uL (ref 0.0–0.1)
Basophils Relative: 0.5 % (ref 0.0–3.0)
EOS ABS: 0.2 10*3/uL (ref 0.0–0.7)
Eosinophils Relative: 4.5 % (ref 0.0–5.0)
HCT: 37.5 % (ref 36.0–46.0)
Hemoglobin: 12.4 g/dL (ref 12.0–15.0)
LYMPHS ABS: 1.3 10*3/uL (ref 0.7–4.0)
Lymphocytes Relative: 26.3 % (ref 12.0–46.0)
MCHC: 33 g/dL (ref 30.0–36.0)
MCV: 87.7 fl (ref 78.0–100.0)
MONO ABS: 0.3 10*3/uL (ref 0.1–1.0)
MONOS PCT: 5.5 % (ref 3.0–12.0)
NEUTROS PCT: 63.2 % (ref 43.0–77.0)
Neutro Abs: 3.1 10*3/uL (ref 1.4–7.7)
Platelets: 289 10*3/uL (ref 150.0–400.0)
RBC: 4.28 Mil/uL (ref 3.87–5.11)
RDW: 14.1 % (ref 11.5–15.5)
WBC: 4.9 10*3/uL (ref 4.0–10.5)

## 2015-06-29 LAB — VITAMIN D 25 HYDROXY (VIT D DEFICIENCY, FRACTURES): VITD: 25.5 ng/mL — AB (ref 30.00–100.00)

## 2015-06-29 LAB — HEPATIC FUNCTION PANEL
ALK PHOS: 85 U/L (ref 39–117)
ALT: 15 U/L (ref 0–35)
AST: 20 U/L (ref 0–37)
Albumin: 3.9 g/dL (ref 3.5–5.2)
BILIRUBIN DIRECT: 0 mg/dL (ref 0.0–0.3)
BILIRUBIN TOTAL: 0.4 mg/dL (ref 0.2–1.2)
Total Protein: 7.3 g/dL (ref 6.0–8.3)

## 2015-06-29 LAB — BASIC METABOLIC PANEL
BUN: 14 mg/dL (ref 6–23)
CALCIUM: 9.2 mg/dL (ref 8.4–10.5)
CO2: 28 mEq/L (ref 19–32)
CREATININE: 0.72 mg/dL (ref 0.40–1.20)
Chloride: 104 mEq/L (ref 96–112)
GFR: 88.23 mL/min (ref >60.00)
GLUCOSE: 115 mg/dL — AB (ref 70–99)
Potassium: 4.7 mEq/L (ref 3.5–5.1)
SODIUM: 139 meq/L (ref 135–145)

## 2015-06-29 LAB — TSH: TSH: 5.67 u[IU]/mL — ABNORMAL HIGH (ref 0.35–4.50)

## 2015-06-29 LAB — HEMOGLOBIN A1C: Hgb A1c MFr Bld: 6.4 % (ref 4.6–6.5)

## 2015-06-29 MED ORDER — ALPRAZOLAM 0.5 MG PO TABS: 0.2500 mg | ORAL_TABLET | Freq: Three times a day (TID) | ORAL | Status: DC | PRN

## 2015-06-29 MED ORDER — HYDROCODONE-ACETAMINOPHEN 5-325 MG PO TABS: 1.0000 | ORAL_TABLET | Freq: Three times a day (TID) | ORAL | Status: DC | PRN

## 2015-06-29 MED ORDER — METOPROLOL TARTRATE 25 MG PO TABS: 25.0000 mg | ORAL_TABLET | Freq: Two times a day (BID) | ORAL | Status: DC

## 2015-06-29 MED ORDER — ALBUTEROL SULFATE HFA 108 (90 BASE) MCG/ACT IN AERS: 2.0000 | INHALATION_SPRAY | Freq: Four times a day (QID) | RESPIRATORY_TRACT | Status: DC | PRN

## 2015-06-29 MED ORDER — CYCLOBENZAPRINE HCL 10 MG PO TABS: 10.0000 mg | ORAL_TABLET | Freq: Three times a day (TID) | ORAL | Status: DC | PRN

## 2015-06-29 NOTE — Progress Notes (Signed)
Subjective:    Patient ID: Debra Watkins, female    DOB: Mar 06, 1956, 59 y.o.   MRN: [id number]  HPI  patient is here today for annual physical. Patient feels well overall. Also reviewed chronic medical conditions, interval events and current concerns  Past Medical History  Diagnosis Date  . Pulmonary nodule     4mm LLL on CXR 09/19/08, not seen on CXR f/u x 5 since  . Palpitations     related to hyperthyroidism  . Hypercholesteremia   . Hyperthyroidism     s/p I-131 ablation 10/2007  . Hypothyroidism     post radiation  . Overweight(278.02)   . Lumbar back pain   . Vitamin D deficiency   . Headache(784.0)   . Anxiety   . Unspecified deficiency anemia    Family History  Problem Relation Age of Onset  . Adopted: Yes   Social History  Substance Use Topics  . Smoking status: Never Smoker   . Smokeless tobacco: None  . Alcohol Use: Yes     Comment: social use    Review of Systems  Constitutional: Negative for fatigue and unexpected weight change.  Respiratory: Negative for cough, shortness of breath and wheezing.   Cardiovascular: Negative for chest pain, palpitations and leg swelling.  Gastrointestinal: Negative for nausea, abdominal pain and diarrhea.  Musculoskeletal: Positive for arthralgias (R knee, wearing brace).  Neurological: Negative for dizziness, weakness, light-headedness and headaches.  Psychiatric/Behavioral: Positive for decreased concentration. Negative for dysphoric mood. The patient is not nervous/anxious.   All other systems reviewed and are negative.      Objective:    Physical Exam  Constitutional: She is oriented to person, place, and time. She appears well-developed and well-nourished. No distress.  obese  HENT:  Head: Normocephalic and atraumatic.  Right Ear: External ear normal.  Left Ear: External ear normal.  Nose: Nose normal.  Mouth/Throat: Oropharynx is clear and moist. No oropharyngeal exudate.  Eyes: EOM are normal. Pupils  are equal, round, and reactive to light. Right eye exhibits no discharge. Left eye exhibits no discharge. No scleral icterus.  Neck: Normal range of motion. Neck supple. No JVD present. No tracheal deviation present. No thyromegaly present.  Cardiovascular: Normal rate, regular rhythm, normal heart sounds and intact distal pulses.  Exam reveals no friction rub.   No murmur heard. Pulmonary/Chest: Effort normal and breath sounds normal. No respiratory distress. She has no wheezes. She has no rales. She exhibits no tenderness.  Abdominal: Soft. Bowel sounds are normal. She exhibits no distension and no mass. There is no tenderness. There is no rebound and no guarding.  Musculoskeletal: Normal range of motion.  Lymphadenopathy:    She has no cervical adenopathy.  Neurological: She is alert and oriented to person, place, and time. She has normal reflexes. No cranial nerve deficit.  Skin: Skin is warm and dry. No rash noted. She is not diaphoretic. No erythema.  Psychiatric: She has a normal mood and affect. Her behavior is normal. Judgment and thought content normal.  Nursing note and vitals reviewed.   BP 130/88 mmHg  Pulse 73  Temp(Src) 97.7 F (36.5 C) (Oral)  Ht 5' 3.5" (1.613 m)  Wt 248 lb (112.492 kg)  BMI 43.24 kg/m2  SpO2 98% Wt Readings from Last 3 Encounters:  06/29/15 248 lb (112.492 kg)  01/16/15 246 lb (111.585 kg)  06/25/14 248 lb 8 oz (112.719 kg)     Lab Results  Component Value Date   WBC 5.6  06/25/2014   HGB 13.0 06/25/2014   HCT 40.0 06/25/2014   PLT 229.0 06/25/2014   GLUCOSE 109* 06/25/2014   CHOL 186 06/25/2014   TRIG 105.0 06/25/2014   HDL 48.90 06/25/2014   LDLDIRECT 121.2 05/07/2012   LDLCALC 116* 06/25/2014   ALT 17 06/25/2014   AST 22 06/25/2014   NA 138 06/25/2014   K 4.6 06/25/2014   CL 102 06/25/2014   CREATININE 0.8 06/25/2014   BUN 14 06/25/2014   CO2 29 06/25/2014   TSH 14.18* 06/25/2014   HGBA1C 6.6* 06/25/2014    Dg Chest 2  View  06/25/2013  CLINICAL DATA:  Physical exam EXAM: CHEST  2 VIEW COMPARISON:  Chest x-ray of 05/07/2012 FINDINGS: No active infiltrate or effusion is seen. Mediastinal contours are stable. The heart is borderline enlarged and stable. Mild degenerative spurring is noted in the lower thoracic spine. IMPRESSION: Stable chest x-ray with borderline cardiomegaly. No active lung disease. Electronically Signed   By: Dwyane DeePaul  Barry M.D.   On: 06/25/2013 15:20       Assessment & Plan:   CPX/z00.00 - Patient has been counseled on age-appropriate routine health concerns for screening and prevention. These are reviewed and up-to-date. Immunizations are up-to-date or declined. Labs ordered and reviewed.  Problem List Items Addressed This Visit    Hyperglycemia    Prior treatments and weight changes reviewed, education on diet and importance of aerobic exercise Last A1c 6.6, recheck now as family history of diabetes is unknown Lab Results  Component Value Date   HGBA1C 6.6* 06/25/2014        Relevant Orders   Basic metabolic panel   Hemoglobin A1c   Hypothyroidism following radioiodine therapy    I-131 radiation April 2009 reviewed. On replacement therapy since Asymptomatic Check TSH and adjust dose as needed Lab Results  Component Value Date   TSH 14.18* 06/25/2014        Relevant Medications   metoprolol tartrate (LOPRESSOR) 25 MG tablet   Other Relevant Orders   TSH   Vitamin D deficiency    Takes OTC supplement - recheck levels now      Relevant Orders   VITAMIN D 25 Hydroxy (Vit-D Deficiency, Fractures)    Other Visit Diagnoses    Routine general medical examination at a health care facility    -  Primary    Relevant Orders    Basic metabolic panel    CBC with Differential/Platelet    Hepatic function panel    Lipid panel    TSH    Urinalysis, Routine w reflex microscopic ([facility])    VITAMIN D 25 Hydroxy (Vit-D Deficiency, Fractures)    Need for prophylactic  vaccination and inoculation against influenza        Relevant Orders    Flu Vaccine QUAD 36+ mos IM (Completed)        Rene PaciValerie Leschber, MD

## 2015-06-29 NOTE — Assessment & Plan Note (Signed)
Takes OTC supplement - recheck levels now

## 2015-06-29 NOTE — Patient Instructions (Addendum)
It was good to see you today.  We have reviewed your prior records including labs and tests today  Annual flu shot updated today - other Health Maintenance reviewed - call your gynecology office about mammogram and PAP and let us know when this is done. All other recommended immunizations and age-appropriate screenings are up-to-date.  Test(s) ordered today. Your results will be released to [facility] (or called to you) after review, usually within 72hours after test completion. If any changes need to be made, you will be notified at that same time.  Medications reviewed and updated, no changes recommended at this time. Refill on medication(s) as discussed today.  Please schedule followup in 12 months for annual exam and labs, call sooner if problems.  Health Maintenance, Female Adopting a healthy lifestyle and getting preventive care can go a long way to promote health and wellness. Talk with your health care provider about what schedule of regular examinations is right for you. This is a good chance for you to check in with your provider about disease prevention and staying healthy. In between checkups, there are plenty of things you can do on your own. Experts have done a lot of research about which lifestyle changes and preventive measures are most likely to keep you healthy. Ask your health care provider for more information. WEIGHT AND DIET  Eat a healthy diet  Be sure to include plenty of vegetables, fruits, low-fat dairy products, and lean protein.  Do not eat a lot of foods high in solid fats, added sugars, or salt.  Get regular exercise. This is one of the most important things you can do for your health.  Most adults should exercise for at least 150 minutes each week. The exercise should increase your heart rate and make you sweat (moderate-intensity exercise).  Most adults should also do strengthening exercises at least twice a week. This is in addition to the moderate-intensity  exercise.  Maintain a healthy weight  Body mass index (BMI) is a measurement that can be used to identify possible weight problems. It estimates body fat based on height and weight. Your health care provider can help determine your BMI and help you achieve or maintain a healthy weight.  For females 28 years of age and older:   A BMI below 18.5 is considered underweight.  A BMI of 18.5 to 24.9 is normal.  A BMI of 25 to 29.9 is considered overweight.  A BMI of 30 and above is considered obese.  Watch levels of cholesterol and blood lipids  You should start having your blood tested for lipids and cholesterol at 58 years of age, then have this test every 5 years.  You may need to have your cholesterol levels checked more often if:  Your lipid or cholesterol levels are high.  You are older than 58 years of age.  You are at high risk for heart disease.  CANCER SCREENING   Lung Cancer  Lung cancer screening is recommended for adults 78-78 years old who are at high risk for lung cancer because of a history of smoking.  A yearly low-dose CT scan of the lungs is recommended for people who:  Currently smoke.  Have quit within the past 15 years.  Have at least a 30-pack-year history of smoking. A pack year is smoking an average of one pack of cigarettes a day for 1 year.  Yearly screening should continue until it has been 15 years since you quit.  Yearly screening should stop  if you develop a health problem that would prevent you from having lung cancer treatment.  Breast Cancer  Practice breast self-awareness. This means understanding how your breasts normally appear and feel.  It also means doing regular breast self-exams. Let your health care provider know about any changes, no matter how small.  If you are in your 20s or 30s, you should have a clinical breast exam (CBE) by a health care provider every 1-3 years as part of a regular health exam.  If you are 51 or  older, have a CBE every year. Also consider having a breast X-ray (mammogram) every year.  If you have a family history of breast cancer, talk to your health care provider about genetic screening.  If you are at high risk for breast cancer, talk to your health care provider about having an MRI and a mammogram every year.  Breast cancer gene (BRCA) assessment is recommended for women who have family members with BRCA-related cancers. BRCA-related cancers include:  Breast.  Ovarian.  Tubal.  Peritoneal cancers.  Results of the assessment will determine the need for genetic counseling and BRCA1 and BRCA2 testing. Cervical Cancer Your health care provider may recommend that you be screened regularly for cancer of the pelvic organs (ovaries, uterus, and vagina). This screening involves a pelvic examination, including checking for microscopic changes to the surface of your cervix (Pap test). You may be encouraged to have this screening done every 3 years, beginning at age 41.  For women ages 14-65, health care providers may recommend pelvic exams and Pap testing every 3 years, or they may recommend the Pap and pelvic exam, combined with testing for human papilloma virus (HPV), every 5 years. Some types of HPV increase your risk of cervical cancer. Testing for HPV may also be done on women of any age with unclear Pap test results.  Other health care providers may not recommend any screening for nonpregnant women who are considered low risk for pelvic cancer and who do not have symptoms. Ask your health care provider if a screening pelvic exam is right for you.  If you have had past treatment for cervical cancer or a condition that could lead to cancer, you need Pap tests and screening for cancer for at least 20 years after your treatment. If Pap tests have been discontinued, your risk factors (such as having a new sexual partner) need to be reassessed to determine if screening should resume. Some  women have medical problems that increase the chance of getting cervical cancer. In these cases, your health care provider may recommend more frequent screening and Pap tests. Colorectal Cancer  This type of cancer can be detected and often prevented.  Routine colorectal cancer screening usually begins at 58 years of age and continues through 58 years of age.  Your health care provider may recommend screening at an earlier age if you have risk factors for colon cancer.  Your health care provider may also recommend using home test kits to check for hidden blood in the stool.  A small camera at the end of a tube can be used to examine your colon directly (sigmoidoscopy or colonoscopy). This is done to check for the earliest forms of colorectal cancer.  Routine screening usually begins at age 58.  Direct examination of the colon should be repeated every 5-10 years through 58 years of age. However, you may need to be screened more often if early forms of precancerous polyps or small growths are found.  Skin Cancer  Check your skin from head to toe regularly.  Tell your health care provider about any new moles or changes in moles, especially if there is a change in a mole's shape or color.  Also tell your health care provider if you have a mole that is larger than the size of a pencil eraser.  Always use sunscreen. Apply sunscreen liberally and repeatedly throughout the day.  Protect yourself by wearing long sleeves, pants, a wide-brimmed hat, and sunglasses whenever you are outside. HEART DISEASE, DIABETES, AND HIGH BLOOD PRESSURE   High blood pressure causes heart disease and increases the risk of stroke. High blood pressure is more likely to develop in:  People who have blood pressure in the high end of the normal range (130-139/85-89 mm Hg).  People who are overweight or obese.  People who are African American.  If you are 69-38 years of age, have your blood pressure checked every  3-5 years. If you are 72 years of age or older, have your blood pressure checked every year. You should have your blood pressure measured twice--once when you are at a hospital or clinic, and once when you are not at a hospital or clinic. Record the average of the two measurements. To check your blood pressure when you are not at a hospital or clinic, you can use:  An automated blood pressure machine at a pharmacy.  A home blood pressure monitor.  If you are between 39 years and 47 years old, ask your health care provider if you should take aspirin to prevent strokes.  Have regular diabetes screenings. This involves taking a blood sample to check your fasting blood sugar level.  If you are at a normal weight and have a low risk for diabetes, have this test once every three years after 58 years of age.  If you are overweight and have a high risk for diabetes, consider being tested at a younger age or more often. PREVENTING INFECTION  Hepatitis B  If you have a higher risk for hepatitis B, you should be screened for this virus. You are considered at high risk for hepatitis B if:  You were born in a country where hepatitis B is common. Ask your health care provider which countries are considered high risk.  Your parents were born in a high-risk country, and you have not been immunized against hepatitis B (hepatitis B vaccine).  You have HIV or AIDS.  You use needles to inject street drugs.  You live with someone who has hepatitis B.  You have had sex with someone who has hepatitis B.  You get hemodialysis treatment.  You take certain medicines for conditions, including cancer, organ transplantation, and autoimmune conditions. Hepatitis C  Blood testing is recommended for:  Everyone born from [birth year] through 1964.  Anyone with known risk factors for hepatitis C. Sexually transmitted infections (STIs)  You should be screened for sexually transmitted infections (STIs) including  gonorrhea and chlamydia if:  You are sexually active and are younger than 58 years of age.  You are older than 58 years of age and your health care provider tells you that you are at risk for this type of infection.  Your sexual activity has changed since you were last screened and you are at an increased risk for chlamydia or gonorrhea. Ask your health care provider if you are at risk.  If you do not have HIV, but are at risk, it may be recommended that you take  a prescription medicine daily to prevent HIV infection. This is called pre-exposure prophylaxis (PrEP). You are considered at risk if:  You are sexually active and do not regularly use condoms or know the HIV status of your partner(s).  You take drugs by injection.  You are sexually active with a partner who has HIV. Talk with your health care provider about whether you are at high risk of being infected with HIV. If you choose to begin PrEP, you should first be tested for HIV. You should then be tested every 3 months for as long as you are taking PrEP.  PREGNANCY   If you are premenopausal and you may become pregnant, ask your health care provider about preconception counseling.  If you may become pregnant, take 400 to 800 micrograms (mcg) of folic acid every day.  If you want to prevent pregnancy, talk to your health care provider about birth control (contraception). OSTEOPOROSIS AND MENOPAUSE   Osteoporosis is a disease in which the bones lose minerals and strength with aging. This can result in serious bone fractures. Your risk for osteoporosis can be identified using a bone density scan.  If you are 78 years of age or older, or if you are at risk for osteoporosis and fractures, ask your health care provider if you should be screened.  Ask your health care provider whether you should take a calcium or vitamin D supplement to lower your risk for osteoporosis.  Menopause may have certain physical symptoms and  risks.  Hormone replacement therapy may reduce some of these symptoms and risks. Talk to your health care provider about whether hormone replacement therapy is right for you.  HOME CARE INSTRUCTIONS   Schedule regular health, dental, and eye exams.  Stay current with your immunizations.   Do not use any tobacco products including cigarettes, chewing tobacco, or electronic cigarettes.  If you are pregnant, do not drink alcohol.  If you are breastfeeding, limit how much and how often you drink alcohol.  Limit alcohol intake to no more than 1 drink per day for nonpregnant women. One drink equals 12 ounces of beer, 5 ounces of wine, or 1 ounces of hard liquor.  Do not use street drugs.  Do not share needles.  Ask your health care provider for help if you need support or information about quitting drugs.  Tell your health care provider if you often feel depressed.  Tell your health care provider if you have ever been abused or do not feel safe at home.   This information is not intended to replace advice given to you by your health care provider. Make sure you discuss any questions you have with your health care provider.   Document Released: 01/31/2011 Document Revised: 08/08/2014 Document Reviewed: 06/19/2013 Elsevier Interactive Patient Education [employer].

## 2015-06-29 NOTE — Assessment & Plan Note (Signed)
Prior treatments and weight changes reviewed, education on diet and importance of aerobic exercise Last A1c 6.6, recheck now as family history of diabetes is unknown Lab Results  Component Value Date   HGBA1C 6.6* 06/25/2014

## 2015-06-29 NOTE — Assessment & Plan Note (Signed)
I-131 radiation April 2009 reviewed. On replacement therapy since Asymptomatic Check TSH and adjust dose as needed Lab Results  Component Value Date   TSH 14.18* 06/25/2014

## 2015-06-29 NOTE — Progress Notes (Signed)
Pre visit review using our clinic review tool, if applicable. No additional management support is needed unless otherwise documented below in the visit note. 

## 2015-07-10 ENCOUNTER — Other Ambulatory Visit: Payer: Self-pay

## 2015-07-10 MED ORDER — LEVOTHYROXINE SODIUM 150 MCG PO TABS: 150.0000 ug | ORAL_TABLET | Freq: Every day | ORAL | Status: DC

## 2015-12-19 ENCOUNTER — Ambulatory Visit (INDEPENDENT_AMBULATORY_CARE_PROVIDER_SITE_OTHER): Payer: BLUE CROSS/BLUE SHIELD | Admitting: Family Medicine

## 2015-12-19 VITALS — BP 126/80 | HR 96 | Temp 98.7°F | Resp 18 | Ht 63.5 in | Wt 250.0 lb

## 2015-12-19 DIAGNOSIS — M5431 Sciatica, right side: Secondary | ICD-10-CM | POA: Diagnosis not present

## 2015-12-19 MED ORDER — PREDNISONE 20 MG PO TABS: ORAL_TABLET | ORAL | Status: DC

## 2015-12-19 NOTE — Patient Instructions (Addendum)

## 2015-12-19 NOTE — Progress Notes (Signed)
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 She's had spontaneous onset of right lateral thigh pain with weightbearing over the last 5 days. When she lies down the pain tends to go away. She is able to cross her legs without any pain.  Patient cannot recall any trauma or falls. She has no back pain. She has no trouble with elimination. She has no fever.  Patient is wearing a knee brace.  Patient has a history of disc disease dating back 25 years. She's not had any recurrence since initial onset.  Objective:BP 126/80 mmHg  Pulse 96  Temp(Src) 98.7 F (37.1 C) (Oral)  Resp 18  Ht 5' 3.5" (1.613 m)  Wt 250 lb (113.399 kg)  BMI 43.59 kg/m2  SpO2 96% Patient's no acute distress sitting in the exam area. Straight-leg raising is negative. There is no loss of muscle mass. Patient has no tenderness of her hip and is able to cross her leg without difficulty. She has no back pain on palpation.  Assessment: The specimen container patient has a sciatic condition that his spontaneously occurred.  Sciatica neuralgia, right - Plan: predniSONE (DELTASONE) 20 MG tablet  Elvina SidleKurt Marithza Malachi, MD

## 2015-12-21 ENCOUNTER — Telehealth: Payer: Self-pay

## 2015-12-21 NOTE — Telephone Encounter (Signed)
Patient was recently seen and states she was advised to call and request gabapentin if the medication prescribed doesn't work. Please advise! [facility] [place] [facility] [address] [place]

## 2015-12-22 ENCOUNTER — Telehealth: Payer: Self-pay | Admitting: Family Medicine

## 2015-12-22 ENCOUNTER — Other Ambulatory Visit: Payer: Self-pay | Admitting: Family Medicine

## 2015-12-22 MED ORDER — GABAPENTIN 100 MG PO CAPS: 100.0000 mg | ORAL_CAPSULE | Freq: Every day | ORAL | Status: DC

## 2015-12-22 NOTE — Telephone Encounter (Signed)
She will need to return.

## 2015-12-22 NOTE — Telephone Encounter (Signed)
Patient stated the pain has not changed. It's getting worse. Dr. Milus GlazierLauenstein instructed the patient to call back if not improved. Patient stated prednisone is not working. She request for gabapentin. Please call patient at work [phone].

## 2015-12-22 NOTE — Telephone Encounter (Signed)
Spoke with pt, she would like Dr. Milus GlazierLauenstein to review.

## 2015-12-23 NOTE — Telephone Encounter (Signed)
Debra Watkins has advised patient should return to clinic /patient disagreed, another message has been sent to Dr Milus GlazierLauenstein, this is duplicate

## 2016-01-26 ENCOUNTER — Ambulatory Visit (INDEPENDENT_AMBULATORY_CARE_PROVIDER_SITE_OTHER): Payer: BLUE CROSS/BLUE SHIELD | Admitting: Internal Medicine

## 2016-01-26 ENCOUNTER — Encounter: Payer: Self-pay | Admitting: Internal Medicine

## 2016-01-26 VITALS — BP 140/80 | HR 99 | Temp 97.9°F | Resp 20 | Wt 243.0 lb

## 2016-01-26 DIAGNOSIS — F411 Generalized anxiety disorder: Secondary | ICD-10-CM

## 2016-01-26 DIAGNOSIS — R739 Hyperglycemia, unspecified: Secondary | ICD-10-CM | POA: Diagnosis not present

## 2016-01-26 DIAGNOSIS — R1013 Epigastric pain: Secondary | ICD-10-CM

## 2016-01-26 MED ORDER — PANTOPRAZOLE SODIUM 40 MG PO TBEC: 40.0000 mg | DELAYED_RELEASE_TABLET | Freq: Every day | ORAL | Status: DC

## 2016-01-26 NOTE — Progress Notes (Signed)
Subjective:    Patient ID: Debra Watkins, female    DOB: 06-25-1956, 60 y.o.   MRN: [id number]  HPI  Here s/p dx and tx for lumbar spinal stenosis and tx with gabapentin per ortho approx x 1 mo, now with 1 wk onset epigastric "discomfort" with nausea that just wont quit, mild to mod, constant, burning sometimes, no vomiting or wt loss, emetrol does not make better, but pepcid helped but only a few hours, did not try any other antacid.  No fever, radiation, bowel change, blood or dysphagia. Recalls years ago she was told she had GERD per Dr Kriste BasqueNadel but did not need persistent tx.  No recent other new medications, only takes rare hydrocodone tx per ortho for LBP, no diarrhea.  Pt continues to have recurring LBP without change in severity, bowel or bladder change, fever, wt loss,  worsening LE pain/numbness/weakness, gait change or falls.  Pt denies polydipsia, polyuria.  Pt denies chest pain, increased sob or doe, wheezing, orthopnea, PND, increased LE swelling, palpitations, dizziness or syncope.  Denies worsening depressive symptoms, suicidal ideation, or panic Past Medical History  Diagnosis Date  . Pulmonary nodule     4mm LLL on CXR 09/19/08, not seen on CXR f/u x 5 since  . Palpitations     related to hyperthyroidism  . Hypercholesteremia   . Hyperthyroidism     s/p I-131 ablation 10/2007  . Hypothyroidism     post radiation  . Overweight(278.02)   . Lumbar back pain   . Vitamin D deficiency   . Headache(784.0)   . Anxiety   . Unspecified deficiency anemia    Past Surgical History  Procedure Laterality Date  . Tonsillectomy  1963  . Therapy for hyperthyroidism  10/2007  . Cholecystectomy, laparoscopic  2010    reports that she has never smoked. She does not have any smokeless tobacco history on file. She reports that she drinks alcohol. She reports that she does not use illicit drugs. family history is not on file. She was adopted. Allergies  Allergen Reactions  . Penicillins       REACTION: rash  . Tetanus Toxoid     REACTION: hives   Current Outpatient Prescriptions on File Prior to Visit  Medication Sig Dispense Refill  . albuterol (PROVENTIL HFA;VENTOLIN HFA) 108 (90 BASE) MCG/ACT inhaler Inhale 2 puffs into the lungs every 6 (six) hours as needed. 3 Inhaler 3  . ALPRAZolam (XANAX) 0.5 MG tablet Take 0.5-1 tablets (0.25-0.5 mg total) by mouth 3 (three) times daily as needed for anxiety. 90 tablet 1  . Cholecalciferol (HM VITAMIN D3) 4000 UNITS CAPS Take 1 capsule by mouth daily.    . cyclobenzaprine (FLEXERIL) 10 MG tablet Take 1 tablet (10 mg total) by mouth 3 (three) times daily as needed for muscle spasms. 90 tablet 5  . gabapentin (NEURONTIN) 100 MG capsule Take 1 capsule (100 mg total) by mouth at bedtime. 30 capsule 3  . HYDROcodone-acetaminophen (NORCO/VICODIN) 5-325 MG tablet Take 1 tablet by mouth 3 (three) times daily as needed for moderate pain. 90 tablet 0  . levothyroxine (SYNTHROID, LEVOTHROID) 150 MCG tablet Take 1 tablet (150 mcg total) by mouth daily before breakfast. 90 tablet 3  . metoprolol tartrate (LOPRESSOR) 25 MG tablet Take 1 tablet (25 mg total) by mouth 2 (two) times daily. 180 tablet 3  . Multiple Vitamins-Minerals (MULTIVITAMIN & MINERAL PO) Take 1 tablet by mouth daily.      . predniSONE (DELTASONE) 20 MG  tablet Two daily with food 10 tablet 0   No current facility-administered medications on file prior to visit.   Review of Systems  Constitutional: Negative for unusual diaphoresis or night sweats HENT: Negative for ear swelling or discharge Eyes: Negative for worsening visual haziness  Respiratory: Negative for choking and stridor.   Gastrointestinal: Negative for distension or worsening eructation Genitourinary: Negative for retention or change in urine volume.  Musculoskeletal: Negative for other MSK pain or swelling Skin: Negative for color change and worsening wound Neurological: Negative for tremors and numbness other than  noted  Psychiatric/Behavioral: Negative for decreased concentration or agitation other than above       Objective:   Physical Exam Blood pressure 140/80, pulse 99, temperature 97.9 F (36.6 C), temperature source Oral, resp. rate 20, weight 243 lb (110.224 kg), SpO2 95 %. s VS noted,  Constitutional: Pt appears in no apparent distress HENT: Head: NCAT.  Right Ear: External ear normal.  Left Ear: External ear normal.  Eyes: . Pupils are equal, round, and reactive to light. Conjunctivae and EOM are normal Neck: Normal range of motion. Neck supple.  Cardiovascular: Normal rate and regular rhythm.   Pulmonary/Chest: Effort normal and breath sounds without rales or wheezing.  Abd:  Soft, NT, ND, + BS - benign exam Neurological: Pt is alert. Not confused , motor grossly intact Skin: Skin is warm. No rash, no LE edema Psychiatric: Pt behavior is normal. No agitation.     Assessment & Plan:

## 2016-01-26 NOTE — Progress Notes (Signed)
Pre visit review using our clinic review tool, if applicable. No additional management support is needed unless otherwise documented below in the visit note. 

## 2016-01-26 NOTE — Assessment & Plan Note (Signed)
Exam benign, suspect peptic related, for protonix 40 qd, has no other red flags, would try med but GI referral if not improved, to check h pylori Ab and labs as ordered,  to f/u any worsening symptoms or concerns

## 2016-01-26 NOTE — Assessment & Plan Note (Signed)
stable overall by history and exam, and pt to continue medical treatment as before,  to f/u any worsening symptoms or concerns 

## 2016-01-26 NOTE — Patient Instructions (Signed)
Please take all new medication as prescribed - the protonix  Please continue all other medications as before, and refills have been done if requested.  Please have the pharmacy call with any other refills you may need.  Please keep your appointments with your specialists as you may have planned  Please go to the LAB in the Basement (turn left off the elevator) for the tests to be done at your convenience  You will be contacted by phone if any changes need to be made immediately.  Otherwise, you will receive a letter about your results with an explanation, but please check with MyChart first.  Please remember to sign up for MyChart if you have not done so, as this will be important to you in the future with finding out test results, communicating by private email, and scheduling acute appointments online when needed.

## 2016-01-26 NOTE — Assessment & Plan Note (Signed)
Lab Results  Component Value Date   HGBA1C 6.4 06/29/2015   stable overall by history and exam, recent data reviewed with pt, and pt to continue medical treatment as before,  to f/u any worsening symptoms or concerns

## 2016-02-29 ENCOUNTER — Encounter: Payer: Self-pay | Admitting: Family

## 2016-02-29 ENCOUNTER — Ambulatory Visit (INDEPENDENT_AMBULATORY_CARE_PROVIDER_SITE_OTHER): Payer: BLUE CROSS/BLUE SHIELD | Admitting: Family

## 2016-02-29 DIAGNOSIS — R05 Cough: Secondary | ICD-10-CM

## 2016-02-29 DIAGNOSIS — R059 Cough, unspecified: Secondary | ICD-10-CM

## 2016-02-29 MED ORDER — AZITHROMYCIN 250 MG PO TABS: ORAL_TABLET | ORAL | 0 refills | Status: DC

## 2016-02-29 MED ORDER — HYDROCODONE-HOMATROPINE 5-1.5 MG/5ML PO SYRP: 5.0000 mL | ORAL_SOLUTION | Freq: Three times a day (TID) | ORAL | 0 refills | Status: DC | PRN

## 2016-02-29 NOTE — Progress Notes (Signed)
Subjective:    Patient ID: Debra Watkins, female    DOB: [birth year], 60 y.o.   MRN: [id number]  Chief Complaint  Patient presents with  . Cough    has chest congestion and productive cough, and wheezing x5 days    HPI:  Debra Watkins is a 60 y.o. female who  has a past medical history of Anxiety; Headache(784.0); Hypercholesteremia; Hyperthyroidism; Hypothyroidism; Lumbar back pain; Overweight(278.02); Palpitations; Pulmonary nodule; Unspecified deficiency anemia; and Vitamin D deficiency. and presents today for an acute office visit.   This is a new problem. Associated symptom of chest congestion, non-productive cough, and wheezing have been going on for approximately 5 days. Denies fevers. Moidifying factors include Delsym which has helped during the day. Albuterol helps as well. Denies any recent antibiotics. Severity of the cough is mild. Course of the symptoms has progressed and feels like it is moving down into her chest.  Allergies  Allergen Reactions  . Penicillins     REACTION: rash  . Tetanus Toxoid     REACTION: hives     Current Outpatient Prescriptions on File Prior to Visit  Medication Sig Dispense Refill  . albuterol (PROVENTIL HFA;VENTOLIN HFA) 108 (90 BASE) MCG/ACT inhaler Inhale 2 puffs into the lungs every 6 (six) hours as needed. 3 Inhaler 3  . ALPRAZolam (XANAX) 0.5 MG tablet Take 0.5-1 tablets (0.25-0.5 mg total) by mouth 3 (three) times daily as needed for anxiety. 90 tablet 1  . Cholecalciferol (HM VITAMIN D3) 4000 UNITS CAPS Take 1 capsule by mouth daily.    . cyclobenzaprine (FLEXERIL) 10 MG tablet Take 1 tablet (10 mg total) by mouth 3 (three) times daily as needed for muscle spasms. 90 tablet 5  . gabapentin (NEURONTIN) 100 MG capsule Take 1 capsule (100 mg total) by mouth at bedtime. 30 capsule 3  . HYDROcodone-acetaminophen (NORCO/VICODIN) 5-325 MG tablet Take 1 tablet by mouth 3 (three) times daily as needed for moderate pain. 90 tablet 0  .  levothyroxine (SYNTHROID, LEVOTHROID) 150 MCG tablet Take 1 tablet (150 mcg total) by mouth daily before breakfast. 90 tablet 3  . metoprolol tartrate (LOPRESSOR) 25 MG tablet Take 1 tablet (25 mg total) by mouth 2 (two) times daily. 180 tablet 3  . Multiple Vitamins-Minerals (MULTIVITAMIN & MINERAL PO) Take 1 tablet by mouth daily.      . pantoprazole (PROTONIX) 40 MG tablet Take 1 tablet (40 mg total) by mouth daily. 30 tablet 11  . predniSONE (DELTASONE) 20 MG tablet Two daily with food 10 tablet 0   No current facility-administered medications on file prior to visit.      Past Surgical History:  Procedure Laterality Date  . CHOLECYSTECTOMY, LAPAROSCOPIC  2010  . therapy for hyperthyroidism  10/2007  . tonsillectomy  1963    Past Medical History:  Diagnosis Date  . Anxiety   . Headache(784.0)   . Hypercholesteremia   . Hyperthyroidism    s/p I-131 ablation 10/2007  . Hypothyroidism    post radiation  . Lumbar back pain   . Overweight(278.02)   . Palpitations    related to hyperthyroidism  . Pulmonary nodule    4mm LLL on CXR 09/19/08, not seen on CXR f/u x 5 since  . Unspecified deficiency anemia   . Vitamin D deficiency     Review of Systems  Constitutional: Negative for chills and fever.  HENT: Negative for congestion, sinus pressure and sore throat.   Respiratory: Positive for cough and wheezing. Negative  for shortness of breath.   Neurological: Negative for headaches.      Objective:    BP 134/78 (BP Location: Left Arm, Patient Position: Sitting, Cuff Size: Large)   Pulse 83   Temp 98.1 F (36.7 C) (Oral)   Resp 18   Ht 5' 3.5" (1.613 m)   Wt 250 lb (113.4 kg)   LMP  (Approximate)   SpO2 97%   BMI 43.59 kg/m  Nursing note and vital signs reviewed.  Physical Exam  Constitutional: She is oriented to person, place, and time. She appears well-developed and well-nourished. No distress.  HENT:  Right Ear: Hearing, tympanic membrane, external ear and ear  canal normal.  Left Ear: Hearing, tympanic membrane, external ear and ear canal normal.  Nose: Nose normal.  Mouth/Throat: Uvula is midline, oropharynx is clear and moist and mucous membranes are normal.  Cardiovascular: Normal rate, regular rhythm, normal heart sounds and intact distal pulses.   Pulmonary/Chest: No respiratory distress. She has wheezes. She has no rales. She exhibits no tenderness.  Neurological: She is alert and oriented to person, place, and time.  Skin: Skin is warm and dry.  Psychiatric: She has a normal mood and affect. Her behavior is normal. Judgment and thought content normal.       Assessment & Plan:   Problem List Items Addressed This Visit      Other   Cough    Symptoms and exam concerning for developing bronchitis with possible reactive airway. Start Breo for cough and wheeze. Continue current dosage of albuterol as needed. Continue over-the-counter medications as needed for symptom relief and supportive care. Start Hycodan as needed for cough and sleep. Written prescription for azithromycin provided if symptoms worsen or do not improve with instructions for watchful waiting.      Relevant Medications   azithromycin (ZITHROMAX) 250 MG tablet    Other Visit Diagnoses   None.      I am having Ms. Pauling start on HYDROcodone-homatropine and azithromycin. I am also having her maintain her Multiple Vitamins-Minerals (MULTIVITAMIN & MINERAL PO), Cholecalciferol, metoprolol tartrate, cyclobenzaprine, ALPRAZolam, HYDROcodone-acetaminophen, albuterol, levothyroxine, predniSONE, gabapentin, and pantoprazole.   Meds ordered this encounter  Medications  . HYDROcodone-homatropine (HYCODAN) 5-1.5 MG/5ML syrup    Sig: Take 5 mLs by mouth every 8 (eight) hours as needed for cough.    Dispense:  120 mL    Refill:  0    Order Specific Question:   Supervising Provider    Answer:   Hillard Danker A [[id number]]  . azithromycin (ZITHROMAX) 250 MG tablet    Sig: Take  2 tablets by mouth for 1 day and then 1 tablet daily for 4 days.    Dispense:  6 tablet    Refill:  0    Order Specific Question:   Supervising Provider    Answer:   Hillard Danker A [[id number]]     Follow-up: Return if symptoms worsen or fail to improve.  Jeanine Luz, FNP

## 2016-02-29 NOTE — Patient Instructions (Signed)
Thank you for choosing [facility].  Summary/Instructions:  Your prescription(s) have been submitted to your pharmacy or been printed and provided for you. Please take as directed and contact our office if you believe you are having problem(s) with the medication(s) or have any questions.  If your symptoms worsen or fail to improve, please contact our office for further instruction, or in case of emergency go directly to the emergency room at the closest medical facility.   General Recommendations:    Please drink plenty of fluids.  Get plenty of rest   Sleep in humidified air  Use saline nasal sprays  Netti pot   OTC Medications:  Decongestants - helps relieve congestion   Flonase (generic fluticasone) or Nasacort (generic triamcinolone) - please make sure to use the "cross-over" technique at a 45 degree angle towards the opposite eye as opposed to straight up the nasal passageway.   Sudafed (generic pseudoephedrine - Note this is the one that is available behind the pharmacy counter); Products with phenylephrine (-PE) may also be used but is often not as effective as pseudoephedrine.   If you have HIGH BLOOD PRESSURE - Coricidin HBP; AVOID any product that is -D as this contains pseudoephedrine which may increase your blood pressure.  Afrin (oxymetazoline) every 6-8 hours for up to 3 days.   Allergies - helps relieve runny nose, itchy eyes and sneezing   Claritin (generic loratidine), Allegra (fexofenidine), or Zyrtec (generic cyrterizine) for runny nose. These medications should not cause drowsiness.  Note - Benadryl (generic diphenhydramine) may be used however may cause drowsiness  Cough -   Delsym or Robitussin (generic dextromethorphan)  Expectorants - helps loosen mucus to ease removal   Mucinex (generic guaifenesin) as directed on the package.  Headaches / General Aches   Tylenol (generic acetaminophen) - DO NOT EXCEED 3 grams (3,000 mg) in a 24  hour time period  Advil/Motrin (generic ibuprofen)   Sore Throat -   Salt water gargle   Chloraseptic (generic benzocaine) spray or lozenges / Sucrets (generic dyclonine)      

## 2016-02-29 NOTE — Assessment & Plan Note (Addendum)
Symptoms and exam concerning for developing bronchitis with possible reactive airway. Start Breo for cough and wheeze. Continue current dosage of albuterol as needed. Continue over-the-counter medications as needed for symptom relief and supportive care. Start Hycodan as needed for cough and sleep. Written prescription for azithromycin provided if symptoms worsen or do not improve with instructions for watchful waiting.

## 2016-03-17 ENCOUNTER — Encounter: Payer: Self-pay | Admitting: Internal Medicine

## 2016-03-17 LAB — HM DIABETES EYE EXAM

## 2016-04-06 ENCOUNTER — Telehealth: Payer: Self-pay | Admitting: Internal Medicine

## 2016-04-06 DIAGNOSIS — R05 Cough: Secondary | ICD-10-CM

## 2016-04-06 DIAGNOSIS — R059 Cough, unspecified: Secondary | ICD-10-CM

## 2016-04-06 MED ORDER — AZITHROMYCIN 250 MG PO TABS: ORAL_TABLET | ORAL | 0 refills | Status: DC

## 2016-04-06 NOTE — Telephone Encounter (Signed)
Please advise 

## 2016-04-06 NOTE — Telephone Encounter (Signed)
Done - if she continues to have symptoms will need follow up office visit.

## 2016-04-06 NOTE — Telephone Encounter (Signed)
Patient called to advise that her cough never went away. She is asking that we call in another round of antibiotics, but declined an appt. Verified pharmacy on file is correct

## 2016-04-06 NOTE — Telephone Encounter (Signed)
Called and advised.

## 2016-05-03 ENCOUNTER — Ambulatory Visit (INDEPENDENT_AMBULATORY_CARE_PROVIDER_SITE_OTHER): Payer: BLUE CROSS/BLUE SHIELD | Admitting: Internal Medicine

## 2016-05-03 ENCOUNTER — Encounter: Payer: Self-pay | Admitting: Internal Medicine

## 2016-05-03 DIAGNOSIS — R05 Cough: Secondary | ICD-10-CM

## 2016-05-03 DIAGNOSIS — R059 Cough, unspecified: Secondary | ICD-10-CM

## 2016-05-03 MED ORDER — FLUTICASONE PROPIONATE 50 MCG/ACT NA SUSP: 2.0000 | Freq: Every day | NASAL | 6 refills | Status: DC

## 2016-05-03 MED ORDER — BENZONATATE 100 MG PO CAPS: 100.0000 mg | ORAL_CAPSULE | Freq: Three times a day (TID) | ORAL | 0 refills | Status: DC | PRN

## 2016-05-03 NOTE — Progress Notes (Signed)
Pre visit review using our clinic review tool, if applicable. No additional management support is needed unless otherwise documented below in the visit note. 

## 2016-05-03 NOTE — Progress Notes (Signed)
   Subjective:    Patient ID: Debra Watkins, female    DOB: 1955/10/11, 60 y.o.   MRN: [id number]  HPI The patient is a 60 YO female coming in for cough. Going on off and on for several months. She was seen in late July and given z-pack which helped. The cough returned about 1 month later and she called and got another z-pack and this helped but the cough has returned in the last week. She is having some nose drainage and congestion with coughing but not taking any allergy medicine. She is taking mucinex which she feels is helping. No fevers or chills. No wheezing or SOB. Not using her albuterol.   Review of Systems  Constitutional: Negative for activity change, appetite change, fatigue, fever and unexpected weight change.  HENT: Positive for congestion and postnasal drip. Negative for rhinorrhea, sinus pressure, sore throat and trouble swallowing.   Eyes: Negative.   Respiratory: Positive for cough. Negative for chest tightness, shortness of breath and wheezing.   Cardiovascular: Negative for chest pain, palpitations and leg swelling.  Gastrointestinal: Negative.       Objective:   Physical Exam  Constitutional: She appears well-developed and well-nourished.  HENT:  Head: Normocephalic and atraumatic.  Right Ear: External ear normal.  Left Ear: External ear normal.  Oropharynx with some redness and clear drainage.  Cardiovascular: Normal rate and regular rhythm.   Pulmonary/Chest: Effort normal and breath sounds normal. No respiratory distress. She has no wheezes. She has no rales.  Abdominal: Soft. She exhibits no distension. There is no tenderness. There is no rebound.  Lymphadenopathy:    She has no cervical adenopathy.  Skin: Skin is warm and dry.   Vitals:   05/03/16 1050  BP: 132/82  Pulse: 71  Resp: 16  Temp: 98 F (36.7 C)  TempSrc: Oral  SpO2: 96%  Weight: 249 lb (112.9 kg)  Height: 5\' 4"  (1.626 m)      Assessment & Plan:

## 2016-05-03 NOTE — Patient Instructions (Signed)
We haves sent in cough medicine called tessalon perles that you can take 1 pill up to 3 times per day for the cough.   We have also sent in flonase which is a nose spray you use 2 sprays in each nostril daily. If you do not want to try this we recommend to buy zyrtec (cetirizine) over the counter and take 10 mg daily.   You do not need antibiotics or steroids today.   Allergic Rhinitis Allergic rhinitis is when the mucous membranes in the nose respond to allergens. Allergens are particles in the air that cause your body to have an allergic reaction. This causes you to release allergic antibodies. Through a chain of events, these eventually cause you to release histamine into the blood stream. Although meant to protect the body, it is this release of histamine that causes your discomfort, such as frequent sneezing, congestion, and an itchy, runny nose.  CAUSES Seasonal allergic rhinitis (hay fever) is caused by pollen allergens that may come from grasses, trees, and weeds. Year-round allergic rhinitis (perennial allergic rhinitis) is caused by allergens such as house dust mites, pet dander, and mold spores. SYMPTOMS  Nasal stuffiness (congestion).  Itchy, runny nose with sneezing and tearing of the eyes. DIAGNOSIS Your health care provider can help you determine the allergen or allergens that trigger your symptoms. If you and your health care provider are unable to determine the allergen, skin or blood testing may be used. Your health care provider will diagnose your condition after taking your health history and performing a physical exam. Your health care provider may assess you for other related conditions, such as asthma, pink eye, or an ear infection. TREATMENT Allergic rhinitis does not have a cure, but it can be controlled by:  Medicines that block allergy symptoms. These may include allergy shots, nasal sprays, and oral antihistamines.  Avoiding the allergen. Hay fever may often be  treated with antihistamines in pill or nasal spray forms. Antihistamines block the effects of histamine. There are over-the-counter medicines that may help with nasal congestion and swelling around the eyes. Check with your health care provider before taking or giving this medicine. If avoiding the allergen or the medicine prescribed do not work, there are many new medicines your health care provider can prescribe. Stronger medicine may be used if initial measures are ineffective. Desensitizing injections can be used if medicine and avoidance does not work. Desensitization is when a patient is given ongoing shots until the body becomes less sensitive to the allergen. Make sure you follow up with your health care provider if problems continue. HOME CARE INSTRUCTIONS It is not possible to completely avoid allergens, but you can reduce your symptoms by taking steps to limit your exposure to them. It helps to know exactly what you are allergic to so that you can avoid your specific triggers. SEEK MEDICAL CARE IF:  You have a fever.  You develop a cough that does not stop easily (persistent).  You have shortness of breath.  You start wheezing.  Symptoms interfere with normal daily activities.   This information is not intended to replace advice given to you by your health care provider. Make sure you discuss any questions you have with your health care provider.   Document Released: 04/12/2001 Document Revised: 08/08/2014 Document Reviewed: 03/25/2013 Elsevier Interactive Patient Education [employer]2016 [employer] Inc.

## 2016-05-03 NOTE — Assessment & Plan Note (Signed)
Suspect from untreated allergies. She denies any GERD symptoms. She is not taking any medications for this. Rx for tessalon perles and flonase today for the symptoms. Lungs clear so no indication for antibiotics or steroids today.

## 2016-05-05 ENCOUNTER — Telehealth: Payer: Self-pay | Admitting: Emergency Medicine

## 2016-05-05 NOTE — Telephone Encounter (Signed)
LVM for pt to call back and discuss.  

## 2016-05-05 NOTE — Telephone Encounter (Signed)
Pt called and states she saw Dr Lawerance BachBurns and Dr Okey Duprerawford for cold/allergies. She states this has been going on since July. She is still not felling better and nothing seems to help. She is now coughing up green stuff with a tinge on blood. She wants to know what the next time to try would be. Please advise thanks.

## 2016-05-05 NOTE — Telephone Encounter (Signed)
I have not seen her for this and actually have never seen her.  Ideally I would recommend coming in.  She just saw Dr Okey Duprerawford and she did not think there was an infection.

## 2016-05-06 ENCOUNTER — Telehealth: Payer: Self-pay | Admitting: Internal Medicine

## 2016-05-06 MED ORDER — PREDNISONE 20 MG PO TABS: 40.0000 mg | ORAL_TABLET | Freq: Every day | ORAL | 0 refills | Status: DC

## 2016-05-06 NOTE — Telephone Encounter (Signed)
Has she started the zyrtec or flonase? Cough can linger for 1-2 weeks after allergy or viral symptoms are gone. Is she having any new breathing problems? Is she using her albuterol inhaler more?

## 2016-05-06 NOTE — Telephone Encounter (Signed)
Rx sent in for prednisone to dry up the drainage since the allergy medications are not working. Keep taking zyrtec and flonase. Prednisone take 2 pills daily for 5 days.

## 2016-05-06 NOTE — Telephone Encounter (Signed)
She saw you on 10/3 for this. She says she is not better. Please advise, thanks.

## 2016-05-06 NOTE — Telephone Encounter (Signed)
Patient has called back.  States she has been using zyrtec and flonase, Tues, wed am, Thursday am, and Friday am.  She used albuterol Thursday night and Friday morning.  States she does not like to use the albuterol much because it makes her heart race.  States she is still coughing up green mucus.  States she can not tell a difference after using the zyrtec and flonase.  Patient states she has felt bad with this condition for a month now.  Patient can be reached at work until 5pm at [phone] or cell after 5pm at [phone].

## 2016-05-06 NOTE — Telephone Encounter (Signed)
Patient is calling about this again. Looks like message was sent to Dr.Burns and should of been sent to Dr. Okey Duprerawford. Patient states she worse than she was on Tuesday. She is still coughing up green. She would like something for the cough and something to help get this infection taken care of. Please follow up with patient. Thank you.   [phone] Call this number she ask.

## 2016-05-06 NOTE — Telephone Encounter (Signed)
Notified patient.

## 2016-06-29 ENCOUNTER — Other Ambulatory Visit (INDEPENDENT_AMBULATORY_CARE_PROVIDER_SITE_OTHER): Payer: BLUE CROSS/BLUE SHIELD

## 2016-06-29 ENCOUNTER — Ambulatory Visit (INDEPENDENT_AMBULATORY_CARE_PROVIDER_SITE_OTHER): Payer: BLUE CROSS/BLUE SHIELD | Admitting: Internal Medicine

## 2016-06-29 ENCOUNTER — Encounter: Payer: Self-pay | Admitting: Internal Medicine

## 2016-06-29 VITALS — BP 144/86 | HR 90 | Temp 98.1°F | Resp 16 | Ht 64.0 in | Wt 241.0 lb

## 2016-06-29 DIAGNOSIS — M545 Low back pain, unspecified: Secondary | ICD-10-CM

## 2016-06-29 DIAGNOSIS — Z23 Encounter for immunization: Secondary | ICD-10-CM

## 2016-06-29 DIAGNOSIS — Z0000 Encounter for general adult medical examination without abnormal findings: Secondary | ICD-10-CM

## 2016-06-29 DIAGNOSIS — M85862 Other specified disorders of bone density and structure, left lower leg: Secondary | ICD-10-CM

## 2016-06-29 DIAGNOSIS — M48061 Spinal stenosis, lumbar region without neurogenic claudication: Secondary | ICD-10-CM | POA: Insufficient documentation

## 2016-06-29 DIAGNOSIS — R7303 Prediabetes: Secondary | ICD-10-CM

## 2016-06-29 DIAGNOSIS — E890 Postprocedural hypothyroidism: Secondary | ICD-10-CM

## 2016-06-29 DIAGNOSIS — E663 Overweight: Secondary | ICD-10-CM

## 2016-06-29 DIAGNOSIS — M85861 Other specified disorders of bone density and structure, right lower leg: Secondary | ICD-10-CM | POA: Diagnosis not present

## 2016-06-29 DIAGNOSIS — G8929 Other chronic pain: Secondary | ICD-10-CM

## 2016-06-29 DIAGNOSIS — F411 Generalized anxiety disorder: Secondary | ICD-10-CM

## 2016-06-29 DIAGNOSIS — J4520 Mild intermittent asthma, uncomplicated: Secondary | ICD-10-CM

## 2016-06-29 DIAGNOSIS — I10 Essential (primary) hypertension: Secondary | ICD-10-CM

## 2016-06-29 DIAGNOSIS — Z13820 Encounter for screening for osteoporosis: Secondary | ICD-10-CM

## 2016-06-29 LAB — COMPREHENSIVE METABOLIC PANEL
ALK PHOS: 99 U/L (ref 39–117)
ALT: 12 U/L (ref 0–35)
AST: 17 U/L (ref 0–37)
Albumin: 3.8 g/dL (ref 3.5–5.2)
BILIRUBIN TOTAL: 0.4 mg/dL (ref 0.2–1.2)
BUN: 7 mg/dL (ref 6–23)
CO2: 29 meq/L (ref 19–32)
CREATININE: 0.79 mg/dL (ref 0.40–1.20)
Calcium: 9.1 mg/dL (ref 8.4–10.5)
Chloride: 104 mEq/L (ref 96–112)
GFR: 79 mL/min (ref >60.00)
GLUCOSE: 123 mg/dL — AB (ref 70–99)
Potassium: 3.7 mEq/L (ref 3.5–5.1)
Sodium: 140 mEq/L (ref 135–145)
TOTAL PROTEIN: 7.3 g/dL (ref 6.0–8.3)

## 2016-06-29 LAB — CBC WITH DIFFERENTIAL/PLATELET
BASOS ABS: 0 10*3/uL (ref 0.0–0.1)
Basophils Relative: 0.6 % (ref 0.0–3.0)
EOS ABS: 0.2 10*3/uL (ref 0.0–0.7)
Eosinophils Relative: 4.1 % (ref 0.0–5.0)
HCT: 38.5 % (ref 36.0–46.0)
Hemoglobin: 12.9 g/dL (ref 12.0–15.0)
LYMPHS ABS: 1.1 10*3/uL (ref 0.7–4.0)
Lymphocytes Relative: 28.7 % (ref 12.0–46.0)
MCHC: 33.5 g/dL (ref 30.0–36.0)
MCV: 83.6 fl (ref 78.0–100.0)
MONO ABS: 0.2 10*3/uL (ref 0.1–1.0)
Monocytes Relative: 6.4 % (ref 3.0–12.0)
NEUTROS ABS: 2.3 10*3/uL (ref 1.4–7.7)
NEUTROS PCT: 60.2 % (ref 43.0–77.0)
PLATELETS: 234 10*3/uL (ref 150.0–400.0)
RBC: 4.61 Mil/uL (ref 3.87–5.11)
RDW: 15.1 % (ref 11.5–15.5)
WBC: 3.9 10*3/uL — ABNORMAL LOW (ref 4.0–10.5)

## 2016-06-29 LAB — LIPID PANEL
CHOLESTEROL: 164 mg/dL (ref 0–200)
HDL: 42.3 mg/dL (ref >39.00)
LDL Cholesterol: 92 mg/dL (ref 0–99)
NONHDL: 121.27
TRIGLYCERIDES: 146 mg/dL (ref 0.0–149.0)
Total CHOL/HDL Ratio: 4
VLDL: 29.2 mg/dL (ref 0.0–40.0)

## 2016-06-29 LAB — TSH: TSH: 4.32 u[IU]/mL (ref 0.35–4.50)

## 2016-06-29 LAB — HEMOGLOBIN A1C: HEMOGLOBIN A1C: 6.6 % — AB (ref 4.6–6.5)

## 2016-06-29 LAB — VITAMIN D 25 HYDROXY (VIT D DEFICIENCY, FRACTURES): VITD: 20.11 ng/mL — ABNORMAL LOW (ref 30.00–100.00)

## 2016-06-29 NOTE — Assessment & Plan Note (Signed)
Intermittent, lower back Flexeril as needed

## 2016-06-29 NOTE — Assessment & Plan Note (Signed)
Advised weight loss Stressed regular exercise

## 2016-06-29 NOTE — Assessment & Plan Note (Signed)
Uses albuterol only with URI's

## 2016-06-29 NOTE — Patient Instructions (Addendum)
Schedule gyn visit and mammogram.  Start regular exercise and work on weight loss.   Test(s) ordered today. Your results will be released to [place] (or called to you) after review, usually within 72hours after test completion. If any changes need to be made, you will be notified at that same time.  All other Health Maintenance issues reviewed.   All recommended immunizations and age-appropriate screenings are up-to-date or discussed.  Flu immunization administered today.   Medications reviewed and updated.  No changes recommended at this time.   Please followup in one year for physical   Health Maintenance, Female Introduction Adopting a healthy lifestyle and getting preventive care can go a long way to promote health and wellness. Talk with your health care provider about what schedule of regular examinations is right for you. This is a good chance for you to check in with your provider about disease prevention and staying healthy. In between checkups, there are plenty of things you can do on your own. Experts have done a lot of research about which lifestyle changes and preventive measures are most likely to keep you healthy. Ask your health care provider for more information. Weight and diet Eat a healthy diet  Be sure to include plenty of vegetables, fruits, low-fat dairy products, and lean protein.  Do not eat a lot of foods high in solid fats, added sugars, or salt.  Get regular exercise. This is one of the most important things you can do for your health.  Most adults should exercise for at least 150 minutes each week. The exercise should increase your heart rate and make you sweat (moderate-intensity exercise).  Most adults should also do strengthening exercises at least twice a week. This is in addition to the moderate-intensity exercise. Maintain a healthy weight  Body mass index (BMI) is a measurement that can be used to identify possible weight problems. It estimates  body fat based on height and weight. Your health care provider can help determine your BMI and help you achieve or maintain a healthy weight.  For females 100 years of age and older:  A BMI below 18.5 is considered underweight.  A BMI of 18.5 to 24.9 is normal.  A BMI of 25 to 29.9 is considered overweight.  A BMI of 30 and above is considered obese. Watch levels of cholesterol and blood lipids  You should start having your blood tested for lipids and cholesterol at 59 years of age, then have this test every 5 years.  You may need to have your cholesterol levels checked more often if:  Your lipid or cholesterol levels are high.  You are older than 59 years of age.  You are at high risk for heart disease. Cancer screening Lung Cancer  Lung cancer screening is recommended for adults 78-18 years old who are at high risk for lung cancer because of a history of smoking.  A yearly low-dose CT scan of the lungs is recommended for people who:  Currently smoke.  Have quit within the past 15 years.  Have at least a 30-pack-year history of smoking. A pack year is smoking an average of one pack of cigarettes a day for 1 year.  Yearly screening should continue until it has been 15 years since you quit.  Yearly screening should stop if you develop a health problem that would prevent you from having lung cancer treatment. Breast Cancer  Practice breast self-awareness. This means understanding how your breasts normally appear and feel.  It  also means doing regular breast self-exams. Let your health care provider know about any changes, no matter how small.  If you are in your 20s or 30s, you should have a clinical breast exam (CBE) by a health care provider every 1-3 years as part of a regular health exam.  If you are 18 or older, have a CBE every year. Also consider having a breast X-ray (mammogram) every year.  If you have a family history of breast cancer, talk to your health care  provider about genetic screening.  If you are at high risk for breast cancer, talk to your health care provider about having an MRI and a mammogram every year.  Breast cancer gene (BRCA) assessment is recommended for women who have family members with BRCA-related cancers. BRCA-related cancers include:  Breast.  Ovarian.  Tubal.  Peritoneal cancers.  Results of the assessment will determine the need for genetic counseling and BRCA1 and BRCA2 testing. Cervical Cancer  Your health care provider may recommend that you be screened regularly for cancer of the pelvic organs (ovaries, uterus, and vagina). This screening involves a pelvic examination, including checking for microscopic changes to the surface of your cervix (Pap test). You may be encouraged to have this screening done every 3 years, beginning at age 2.  For women ages 56-65, health care providers may recommend pelvic exams and Pap testing every 3 years, or they may recommend the Pap and pelvic exam, combined with testing for human papilloma virus (HPV), every 5 years. Some types of HPV increase your risk of cervical cancer. Testing for HPV may also be done on women of any age with unclear Pap test results.  Other health care providers may not recommend any screening for nonpregnant women who are considered low risk for pelvic cancer and who do not have symptoms. Ask your health care provider if a screening pelvic exam is right for you.  If you have had past treatment for cervical cancer or a condition that could lead to cancer, you need Pap tests and screening for cancer for at least 20 years after your treatment. If Pap tests have been discontinued, your risk factors (such as having a new sexual partner) need to be reassessed to determine if screening should resume. Some women have medical problems that increase the chance of getting cervical cancer. In these cases, your health care provider may recommend more frequent screening and  Pap tests. Colorectal Cancer  This type of cancer can be detected and often prevented.  Routine colorectal cancer screening usually begins at 59 years of age and continues through 59 years of age.  Your health care provider may recommend screening at an earlier age if you have risk factors for colon cancer.  Your health care provider may also recommend using home test kits to check for hidden blood in the stool.  A small camera at the end of a tube can be used to examine your colon directly (sigmoidoscopy or colonoscopy). This is done to check for the earliest forms of colorectal cancer.  Routine screening usually begins at age 23.  Direct examination of the colon should be repeated every 5-10 years through 59 years of age. However, you may need to be screened more often if early forms of precancerous polyps or small growths are found. Skin Cancer  Check your skin from head to toe regularly.  Tell your health care provider about any new moles or changes in moles, especially if there is a change in a  mole's shape or color.  Also tell your health care provider if you have a mole that is larger than the size of a pencil eraser.  Always use sunscreen. Apply sunscreen liberally and repeatedly throughout the day.  Protect yourself by wearing long sleeves, pants, a wide-brimmed hat, and sunglasses whenever you are outside. Heart disease, diabetes, and high blood pressure  High blood pressure causes heart disease and increases the risk of stroke. High blood pressure is more likely to develop in:  People who have blood pressure in the high end of the normal range (130-139/85-89 mm Hg).  People who are overweight or obese.  People who are African American.  If you are 98-39 years of age, have your blood pressure checked every 3-5 years. If you are 12 years of age or older, have your blood pressure checked every year. You should have your blood pressure measured twice-once when you are at a  hospital or clinic, and once when you are not at a hospital or clinic. Record the average of the two measurements. To check your blood pressure when you are not at a hospital or clinic, you can use:  An automated blood pressure machine at a pharmacy.  A home blood pressure monitor.  If you are between 8 years and 45 years old, ask your health care provider if you should take aspirin to prevent strokes.  Have regular diabetes screenings. This involves taking a blood sample to check your fasting blood sugar level.  If you are at a normal weight and have a low risk for diabetes, have this test once every three years after 59 years of age.  If you are overweight and have a high risk for diabetes, consider being tested at a younger age or more often. Preventing infection Hepatitis B  If you have a higher risk for hepatitis B, you should be screened for this virus. You are considered at high risk for hepatitis B if:  You were born in a country where hepatitis B is common. Ask your health care provider which countries are considered high risk.  Your parents were born in a high-risk country, and you have not been immunized against hepatitis B (hepatitis B vaccine).  You have HIV or AIDS.  You use needles to inject street drugs.  You live with someone who has hepatitis B.  You have had sex with someone who has hepatitis B.  You get hemodialysis treatment.  You take certain medicines for conditions, including cancer, organ transplantation, and autoimmune conditions. Hepatitis C  Blood testing is recommended for:  Everyone born from [birth year] through 1964.  Anyone with known risk factors for hepatitis C. Sexually transmitted infections (STIs)  You should be screened for sexually transmitted infections (STIs) including gonorrhea and chlamydia if:  You are sexually active and are younger than 59 years of age.  You are older than 59 years of age and your health care provider tells you  that you are at risk for this type of infection.  Your sexual activity has changed since you were last screened and you are at an increased risk for chlamydia or gonorrhea. Ask your health care provider if you are at risk.  If you do not have HIV, but are at risk, it may be recommended that you take a prescription medicine daily to prevent HIV infection. This is called pre-exposure prophylaxis (PrEP). You are considered at risk if:  You are sexually active and do not regularly use condoms or know the HIV  status of your partner(s).  You take drugs by injection.  You are sexually active with a partner who has HIV. Talk with your health care provider about whether you are at high risk of being infected with HIV. If you choose to begin PrEP, you should first be tested for HIV. You should then be tested every 3 months for as long as you are taking PrEP. Pregnancy  If you are premenopausal and you may become pregnant, ask your health care provider about preconception counseling.  If you may become pregnant, take 400 to 800 micrograms (mcg) of folic acid every day.  If you want to prevent pregnancy, talk to your health care provider about birth control (contraception). Osteoporosis and menopause  Osteoporosis is a disease in which the bones lose minerals and strength with aging. This can result in serious bone fractures. Your risk for osteoporosis can be identified using a bone density scan.  If you are 88 years of age or older, or if you are at risk for osteoporosis and fractures, ask your health care provider if you should be screened.  Ask your health care provider whether you should take a calcium or vitamin D supplement to lower your risk for osteoporosis.  Menopause may have certain physical symptoms and risks.  Hormone replacement therapy may reduce some of these symptoms and risks. Talk to your health care provider about whether hormone replacement therapy is right for you. Follow  these instructions at home:  Schedule regular health, dental, and eye exams.  Stay current with your immunizations.  Do not use any tobacco products including cigarettes, chewing tobacco, or electronic cigarettes.  If you are pregnant, do not drink alcohol.  If you are breastfeeding, limit how much and how often you drink alcohol.  Limit alcohol intake to no more than 1 drink per day for nonpregnant women. One drink equals 12 ounces of beer, 5 ounces of wine, or 1 ounces of hard liquor.  Do not use street drugs.  Do not share needles.  Ask your health care provider for help if you need support or information about quitting drugs.  Tell your health care provider if you often feel depressed.  Tell your health care provider if you have ever been abused or do not feel safe at home. This information is not intended to replace advice given to you by your health care provider. Make sure you discuss any questions you have with your health care provider. Document Released: 01/31/2011 Document Revised: 12/24/2015 Document Reviewed: 04/21/2015  2017 Elsevier

## 2016-06-29 NOTE — Progress Notes (Signed)
Pre visit review using our clinic review tool, if applicable. No additional management support is needed unless otherwise documented below in the visit note. 

## 2016-06-29 NOTE — Assessment & Plan Note (Signed)
BP high today, but she feels this is white coat htn Continue current medication Monitor BP Work on exercise and weight loss cmp

## 2016-06-29 NOTE — Progress Notes (Signed)
Subjective:    Patient ID: Debra Watkins, female    DOB: August 29, 1955, 60 y.o.   MRN: [id number]  HPI She is here to establish with a new pcp.  She is here for a physical exam.   Hypothyroidism:  She is taking her medication daily.  She denies any recent changes in energy or weight that are unexplained.   Hypertension: She is taking her medication daily. She is compliant with a low sodium diet.  She denies chest pain, palpitations, edema, shortness of breath and regular headaches. She is not exercising regularly.  She does not monitor her blood pressure at home.    Prediabetes:  She is not always compliant with a low sugar/carbohydrate diet.  She is not exercising regularly.   Medications and allergies reviewed with patient and updated if appropriate.  Patient Active Problem List   Diagnosis Date Noted  . Prediabetes 06/29/2016  . Essential hypertension, benign 06/29/2016  . Chronic lower back pain 06/29/2016  . Mild reactive airways disease 01/16/2015  . DJD (degenerative joint disease) 05/07/2012  . Osteopenia 05/07/2012  . Vitamin D deficiency 02/21/2009  . Hypothyroidism following radioiodine therapy 03/10/2008  . Overweight 12/13/2007  . Anxiety state 12/13/2007    Current Outpatient Prescriptions on File Prior to Visit  Medication Sig Dispense Refill  . albuterol (PROVENTIL HFA;VENTOLIN HFA) 108 (90 BASE) MCG/ACT inhaler Inhale 2 puffs into the lungs every 6 (six) hours as needed. 3 Inhaler 3  . ALPRAZolam (XANAX) 0.5 MG tablet Take 0.5-1 tablets (0.25-0.5 mg total) by mouth 3 (three) times daily as needed for anxiety. 90 tablet 1  . Cholecalciferol (HM VITAMIN D3) 4000 UNITS CAPS Take 1 capsule by mouth daily.    . cyclobenzaprine (FLEXERIL) 10 MG tablet Take 1 tablet (10 mg total) by mouth 3 (three) times daily as needed for muscle spasms. 90 tablet 5  . levothyroxine (SYNTHROID, LEVOTHROID) 150 MCG tablet Take 1 tablet (150 mcg total) by mouth daily before  breakfast. 90 tablet 3  . metoprolol tartrate (LOPRESSOR) 25 MG tablet Take 1 tablet (25 mg total) by mouth 2 (two) times daily. 180 tablet 3   No current facility-administered medications on file prior to visit.     Past Medical History:  Diagnosis Date  . Anxiety   . Headache(784.0)   . Hypercholesteremia   . Hyperthyroidism    s/p I-131 ablation 10/2007  . Hypothyroidism    post radiation  . Lumbar back pain   . Overweight(278.02)   . Palpitations    related to hyperthyroidism  . Pulmonary nodule    4mm LLL on CXR 09/19/08, not seen on CXR f/u x 5 since  . Unspecified deficiency anemia   . Vitamin D deficiency     Past Surgical History:  Procedure Laterality Date  . CHOLECYSTECTOMY, LAPAROSCOPIC  2010  . therapy for hyperthyroidism  10/2007  . tonsillectomy  1963    Social History   Social History  . Marital status: Divorced    Spouse name: N/A  . Number of children: 0  . Years of education: N/A   Social History Main Topics  . Smoking status: Never Smoker  . Smokeless tobacco: Never Used  . Alcohol use Yes     Comment: social use  . Drug use: No  . Sexual activity: Not Asked   Other Topics Concern  . None   Social History Narrative   Pt was adopted   Works Engineer, [employer]   Lives  alone with 2 pets   [facility] dad passed 11/2014    Family History  Problem Relation Age of Onset  . Adopted: Yes    Review of Systems  Constitutional: Negative for chills and fever.  Eyes: Negative for visual disturbance.  Respiratory: Positive for cough (dry - chronic) and wheezing (occ). Negative for shortness of breath.   Cardiovascular: Negative for chest pain, palpitations and leg swelling.  Gastrointestinal: Negative for abdominal pain, blood in stool, constipation, diarrhea and nausea.       No gerd  Genitourinary: Negative for dysuria and hematuria.  Musculoskeletal: Positive for back pain (intermitent).    Skin: Negative for color change and rash.  Neurological: Positive for headaches (occ, stress related). Negative for dizziness and light-headedness.  Psychiatric/Behavioral: Negative for dysphoric mood. The patient is nervous/anxious.        Objective:   Vitals:   06/29/16 0804  BP: (!) 144/86  Pulse: 90  Resp: 16  Temp: 98.1 F (36.7 C)   Filed Weights   06/29/16 0804  Weight: 241 lb (109.3 kg)   Body mass index is 41.37 kg/m.   Physical Exam Constitutional: She appears well-developed and well-nourished. No distress.  HENT:  Head: Normocephalic and atraumatic.  Right Ear: External ear normal. Normal ear canal and TM Left Ear: External ear normal.  Normal ear canal and TM Mouth/Throat: Oropharynx is clear and moist.  Eyes: Conjunctivae and EOM are normal.  Neck: Neck supple. No tracheal deviation present. No thyromegaly present.  No carotid bruit  Cardiovascular: Normal rate, regular rhythm and normal heart sounds.   No murmur heard.  No edema. Pulmonary/Chest: Effort normal and breath sounds normal. No respiratory distress. She has no wheezes. She has no rales.  Breast: deferred to Gyn Abdominal: Soft. She exhibits no distension. There is no tenderness.  Lymphadenopathy: She has no cervical adenopathy.  Skin: Skin is warm and dry. She is not diaphoretic.  Psychiatric: She has a normal mood and affect. Her behavior is normal.        Assessment & Plan:   Physical exam: Screening blood work  ordered Immunizations  Flu vaccine today, others up to date Colonoscopy Up to date  Mammogram - due - will schedule Gyn - not up to date - will schedule Eye exams  Up to date  Exercise - none at this time, stressed regular exercise Weight -- advised weight loss Skin - non concerns Substance abuse  none  See Problem List for Assessment and Plan of chronic medical problems.  F/u annually

## 2016-06-29 NOTE — Assessment & Plan Note (Signed)
Check tsh  Titrate med dose if needed  

## 2016-06-29 NOTE — Assessment & Plan Note (Signed)
Intermittent anxiety Takes xanax only as needed

## 2016-06-29 NOTE — Assessment & Plan Note (Signed)
Dexa ordered Taking vitamin d Stressed regular exercise

## 2016-06-29 NOTE — Assessment & Plan Note (Signed)
Check a1c Advised low sugar/carb diet Stressed exercise and weight loss

## 2016-07-01 ENCOUNTER — Other Ambulatory Visit: Payer: Self-pay | Admitting: Emergency Medicine

## 2016-07-01 MED ORDER — METOPROLOL TARTRATE 25 MG PO TABS: 25.0000 mg | ORAL_TABLET | Freq: Two times a day (BID) | ORAL | 3 refills | Status: DC

## 2016-07-01 MED ORDER — CYCLOBENZAPRINE HCL 10 MG PO TABS: 10.0000 mg | ORAL_TABLET | Freq: Three times a day (TID) | ORAL | 5 refills | Status: DC | PRN

## 2016-07-01 MED ORDER — LEVOTHYROXINE SODIUM 150 MCG PO TABS: 150.0000 ug | ORAL_TABLET | Freq: Every day | ORAL | 3 refills | Status: DC

## 2016-07-01 MED ORDER — ALBUTEROL SULFATE HFA 108 (90 BASE) MCG/ACT IN AERS: 2.0000 | INHALATION_SPRAY | Freq: Four times a day (QID) | RESPIRATORY_TRACT | 3 refills | Status: DC | PRN

## 2016-07-01 MED ORDER — ALPRAZOLAM 0.5 MG PO TABS: 0.2500 mg | ORAL_TABLET | Freq: Three times a day (TID) | ORAL | 1 refills | Status: DC | PRN

## 2016-07-02 ENCOUNTER — Encounter: Payer: Self-pay | Admitting: Internal Medicine

## 2016-07-02 DIAGNOSIS — E119 Type 2 diabetes mellitus without complications: Secondary | ICD-10-CM | POA: Insufficient documentation

## 2016-07-17 ENCOUNTER — Encounter: Payer: Self-pay | Admitting: Internal Medicine

## 2017-07-03 NOTE — Progress Notes (Signed)
Subjective:    Patient ID: Debra BoucheElizabeth A Dimaggio, female    DOB: [birth year], 61 y.o.   MRN: [id number]  HPI She is here for a physical exam.     Medications and allergies reviewed with patient and updated if appropriate.  Patient Active Problem List   Diagnosis Date Noted  . Diabetes (HCC) 07/02/2016  . Essential hypertension, benign 06/29/2016  . Chronic lower back pain 06/29/2016  . Mild reactive airways disease 01/16/2015  . DJD (degenerative joint disease) 05/07/2012  . Osteopenia 05/07/2012  . Vitamin D deficiency 02/21/2009  . Hypothyroidism following radioiodine therapy 03/10/2008  . Overweight 12/13/2007  . Anxiety state 12/13/2007    Current Outpatient Medications on File Prior to Visit  Medication Sig Dispense Refill  . albuterol (PROVENTIL HFA;VENTOLIN HFA) 108 (90 Base) MCG/ACT inhaler Inhale 2 puffs into the lungs every 6 (six) hours as needed. 3 Inhaler 3  . ALPRAZolam (XANAX) 0.5 MG tablet Take 0.5-1 tablets (0.25-0.5 mg total) by mouth 3 (three) times daily as needed for anxiety. 90 tablet 1  . Cholecalciferol (HM VITAMIN D3) 4000 UNITS CAPS Take 1 capsule by mouth daily.    . cyclobenzaprine (FLEXERIL) 10 MG tablet Take 1 tablet (10 mg total) by mouth 3 (three) times daily as needed for muscle spasms. 90 tablet 5  . levothyroxine (SYNTHROID, LEVOTHROID) 150 MCG tablet Take 1 tablet (150 mcg total) by mouth daily before breakfast. 90 tablet 3  . metoprolol tartrate (LOPRESSOR) 25 MG tablet Take 1 tablet (25 mg total) by mouth 2 (two) times daily. 180 tablet 3   No current facility-administered medications on file prior to visit.     Past Medical History:  Diagnosis Date  . Anxiety   . Headache(784.0)   . Hypercholesteremia   . Hyperthyroidism    s/p I-131 ablation 10/2007  . Hypothyroidism    post radiation  . Lumbar back pain   . Overweight(278.02)   . Palpitations    related to hyperthyroidism  . Pulmonary nodule    4mm LLL on CXR 09/19/08, not seen  on CXR f/u x 5 since  . Unspecified deficiency anemia   . Vitamin D deficiency     Past Surgical History:  Procedure Laterality Date  . CHOLECYSTECTOMY, LAPAROSCOPIC  2010  . therapy for hyperthyroidism  10/2007  . tonsillectomy  1963    Social History   Socioeconomic History  . Marital status: Divorced    Spouse name: Not on file  . Number of children: 0  . Years of education: Not on file  . Highest education level: Not on file  Social Needs  . Financial resource strain: Not on file  . Food insecurity - worry: Not on file  . Food insecurity - inability: Not on file  . Transportation needs - medical: Not on file  . Transportation needs - non-medical: Not on file  Occupational History  . Not on file  Tobacco Use  . Smoking status: Never Smoker  . Smokeless tobacco: Never Used  Substance and Sexual Activity  . Alcohol use: Yes    Comment: social use  . Drug use: No  . Sexual activity: Not on file  Other Topics Concern  . Not on file  Social History Narrative   Pt was adopted   Works office [employer]   Lives alone with 2 pets   [facility]), dad passed 11/2014    Family History  Adopted: Yes  Review of Systems     Objective:  There were no vitals filed for this visit. There were no vitals filed for this visit. There is no height or weight on file to calculate BMI.  Wt Readings from Last 3 Encounters:  06/29/16 241 lb (109.3 kg)  05/03/16 249 lb (112.9 kg)  02/29/16 250 lb (113.4 kg)     Physical Exam        Assessment & Plan:     See Problem List for Assessment and Plan of chronic medical problems.       This encounter was created in error - please disregard.

## 2017-07-04 ENCOUNTER — Encounter: Payer: BLUE CROSS/BLUE SHIELD | Admitting: Internal Medicine

## 2017-07-04 NOTE — Progress Notes (Signed)
Subjective:    Patient ID: Debra Watkins, female    DOB: [birth year], 61 y.o.   MRN: [id number]  HPI She is here for a physical exam.   She has no concerns.  She denies changes in her health.   Medications and allergies reviewed with patient and updated if appropriate.  Patient Active Problem List   Diagnosis Date Noted  . Diabetes (HCC) 07/02/2016  . Essential hypertension, benign 06/29/2016  . Chronic lower back pain 06/29/2016  . Mild reactive airways disease 01/16/2015  . DJD (degenerative joint disease) 05/07/2012  . Osteopenia 05/07/2012  . Vitamin D deficiency 02/21/2009  . Hypothyroidism following radioiodine therapy 03/10/2008  . Overweight 12/13/2007  . Anxiety state 12/13/2007    Current Outpatient Medications on File Prior to Visit  Medication Sig Dispense Refill  . albuterol (PROVENTIL HFA;VENTOLIN HFA) 108 (90 Base) MCG/ACT inhaler Inhale 2 puffs into the lungs every 6 (six) hours as needed. 3 Inhaler 3  . ALPRAZolam (XANAX) 0.5 MG tablet Take 0.5-1 tablets (0.25-0.5 mg total) by mouth 3 (three) times daily as needed for anxiety. 90 tablet 1  . Cholecalciferol (HM VITAMIN D3) 4000 UNITS CAPS Take 1 capsule by mouth daily.    . cyclobenzaprine (FLEXERIL) 10 MG tablet Take 1 tablet (10 mg total) by mouth 3 (three) times daily as needed for muscle spasms. 90 tablet 5  . levothyroxine (SYNTHROID, LEVOTHROID) 150 MCG tablet Take 1 tablet (150 mcg total) by mouth daily before breakfast. 90 tablet 3  . metoprolol tartrate (LOPRESSOR) 25 MG tablet Take 1 tablet (25 mg total) by mouth 2 (two) times daily. 180 tablet 3   No current facility-administered medications on file prior to visit.     Past Medical History:  Diagnosis Date  . Anxiety   . Headache(784.0)   . Hypercholesteremia   . Hyperthyroidism    s/p I-131 ablation 10/2007  . Hypothyroidism    post radiation  . Lumbar back pain   . Overweight(278.02)   . Palpitations    related to hyperthyroidism    . Pulmonary nodule    4mm LLL on CXR 09/19/08, not seen on CXR f/u x 5 since  . Unspecified deficiency anemia   . Vitamin D deficiency     Past Surgical History:  Procedure Laterality Date  . CHOLECYSTECTOMY, LAPAROSCOPIC  2010  . therapy for hyperthyroidism  10/2007  . tonsillectomy  1963    Social History   Socioeconomic History  . Marital status: Divorced    Spouse name: None  . Number of children: 0  . Years of education: None  . Highest education level: None  Social Needs  . Financial resource strain: None  . Food insecurity - worry: None  . Food insecurity - inability: None  . Transportation needs - medical: None  . Transportation needs - non-medical: None  Occupational History  . None  Tobacco Use  . Smoking status: Never Smoker  . Smokeless tobacco: Never Used  Substance and Sexual Activity  . Alcohol use: Yes    Comment: social use  . Drug use: No  . Sexual activity: None  Other Topics Concern  . None  Social History Narrative   Pt was adopted   [employer] [place] [employer]   Lives alone with 2 pets   [facility] dad passed 11/2014    Family History  Adopted: Yes    Review of Systems  Constitutional: Negative for chills and  fever.  Eyes: Negative for visual disturbance.  Respiratory: Positive for cough (none now - occ - uses albuterol prn). Negative for shortness of breath and wheezing.   Cardiovascular: Negative for chest pain, palpitations and leg swelling.  Gastrointestinal: Negative for abdominal pain, blood in stool, constipation, diarrhea and nausea.       No gerd  Genitourinary: Negative for dysuria and hematuria.  Musculoskeletal: Positive for arthralgias (knee pain) and back pain (chronic, intermittent).  Skin: Negative for color change and rash.  Neurological: Positive for headaches (occ). Negative for light-headedness.  Psychiatric/Behavioral: Negative for dysphoric mood. The patient is not  nervous/anxious.        Objective:   Vitals:   07/05/17 1307  BP: (!) 152/84  Pulse: 91  Resp: 16  Temp: 97.8 F (36.6 C)  SpO2: 99%   Filed Weights   07/05/17 1307  Weight: 242 lb (109.8 kg)   Body mass index is 41.54 kg/m.  Wt Readings from Last 3 Encounters:  07/05/17 242 lb (109.8 kg)  06/29/16 241 lb (109.3 kg)  05/03/16 249 lb (112.9 kg)     Physical Exam Constitutional: She appears well-developed and well-nourished. No distress.  HENT:  Head: Normocephalic and atraumatic.  Right Ear: External ear normal. Normal ear canal and TM Left Ear: External ear normal.  Normal ear canal and TM Mouth/Throat: Oropharynx is clear and moist.  Eyes: Conjunctivae and EOM are normal.  Neck: Neck supple. No tracheal deviation present. No thyromegaly present.  No carotid bruit  Cardiovascular: Normal rate, regular rhythm and normal heart sounds.   No murmur heard.  No edema. Pulmonary/Chest: Effort normal and breath sounds normal. No respiratory distress. She has no wheezes. She has no rales.  Breast: deferred to Gyn Abdominal: Soft. She exhibits no distension. There is no tenderness.  Lymphadenopathy: She has no cervical adenopathy.  Skin: Skin is warm and dry. She is not diaphoretic.  Psychiatric: She has a normal mood and affect. Her behavior is normal.   Diabetic Foot Exam - Simple   Simple Foot Form Diabetic Foot exam was performed with the following findings:  Yes 07/05/2017  1:40 PM  Visual Inspection No deformities, no ulcerations, no other skin breakdown bilaterally:  Yes Sensation Testing Intact to touch and monofilament testing bilaterally:  Yes Pulse Check Posterior Tibialis and Dorsalis pulse intact bilaterally:  Yes Comments         Assessment & Plan:   Physical exam: Screening blood work   ordered Immunizations   Flu vaccine today, discussed pneumovax and shingrix Colonoscopy   Up to date  Mammogram   Not up to date - will schedule Gyn  Not up to  date - will schedule dexa - last done 2012 - ordered Eye exams   Up to date  - Dr Digby's office EKG       Last EKG 2012 Exercise  None - stressed regular exercise Weight  Advised weight loss Skin  No concerns Substance abuse   none  See Problem List for Assessment and Plan of chronic medical problems.

## 2017-07-05 ENCOUNTER — Encounter: Payer: Self-pay | Admitting: Internal Medicine

## 2017-07-05 ENCOUNTER — Other Ambulatory Visit (INDEPENDENT_AMBULATORY_CARE_PROVIDER_SITE_OTHER): Payer: BLUE CROSS/BLUE SHIELD

## 2017-07-05 ENCOUNTER — Ambulatory Visit (INDEPENDENT_AMBULATORY_CARE_PROVIDER_SITE_OTHER): Payer: BLUE CROSS/BLUE SHIELD | Admitting: Internal Medicine

## 2017-07-05 VITALS — BP 152/84 | HR 91 | Temp 97.8°F | Resp 16 | Ht 64.0 in | Wt 242.0 lb

## 2017-07-05 DIAGNOSIS — F411 Generalized anxiety disorder: Secondary | ICD-10-CM

## 2017-07-05 DIAGNOSIS — Z794 Long term (current) use of insulin: Secondary | ICD-10-CM

## 2017-07-05 DIAGNOSIS — E119 Type 2 diabetes mellitus without complications: Secondary | ICD-10-CM | POA: Diagnosis not present

## 2017-07-05 DIAGNOSIS — Z0000 Encounter for general adult medical examination without abnormal findings: Secondary | ICD-10-CM

## 2017-07-05 DIAGNOSIS — Z23 Encounter for immunization: Secondary | ICD-10-CM

## 2017-07-05 DIAGNOSIS — M85861 Other specified disorders of bone density and structure, right lower leg: Secondary | ICD-10-CM | POA: Diagnosis not present

## 2017-07-05 DIAGNOSIS — I10 Essential (primary) hypertension: Secondary | ICD-10-CM

## 2017-07-05 DIAGNOSIS — J4520 Mild intermittent asthma, uncomplicated: Secondary | ICD-10-CM | POA: Diagnosis not present

## 2017-07-05 DIAGNOSIS — G8929 Other chronic pain: Secondary | ICD-10-CM

## 2017-07-05 DIAGNOSIS — M85862 Other specified disorders of bone density and structure, left lower leg: Secondary | ICD-10-CM

## 2017-07-05 DIAGNOSIS — M545 Low back pain: Secondary | ICD-10-CM

## 2017-07-05 DIAGNOSIS — E890 Postprocedural hypothyroidism: Secondary | ICD-10-CM

## 2017-07-05 LAB — LIPID PANEL
CHOL/HDL RATIO: 3
Cholesterol: 156 mg/dL (ref 0–200)
HDL: 50.3 mg/dL (ref >39.00)
LDL Cholesterol: 86 mg/dL (ref 0–99)
NONHDL: 105.64
TRIGLYCERIDES: 99 mg/dL (ref 0.0–149.0)
VLDL: 19.8 mg/dL (ref 0.0–40.0)

## 2017-07-05 LAB — MICROALBUMIN / CREATININE URINE RATIO
CREATININE, U: 42.1 mg/dL
MICROALB/CREAT RATIO: 1.7 mg/g (ref 0.0–30.0)
Microalb, Ur: <0.7 mg/dL (ref 0.0–1.9)

## 2017-07-05 LAB — CBC WITH DIFFERENTIAL/PLATELET
BASOS ABS: 0 10*3/uL (ref 0.0–0.1)
Basophils Relative: 0.7 % (ref 0.0–3.0)
EOS ABS: 0.2 10*3/uL (ref 0.0–0.7)
Eosinophils Relative: 4 % (ref 0.0–5.0)
HCT: 39.4 % (ref 36.0–46.0)
HEMOGLOBIN: 12.8 g/dL (ref 12.0–15.0)
LYMPHS PCT: 29.2 % (ref 12.0–46.0)
Lymphs Abs: 1.3 10*3/uL (ref 0.7–4.0)
MCHC: 32.5 g/dL (ref 30.0–36.0)
MCV: 86.9 fl (ref 78.0–100.0)
Monocytes Absolute: 0.3 10*3/uL (ref 0.1–1.0)
Monocytes Relative: 7.1 % (ref 3.0–12.0)
NEUTROS PCT: 59 % (ref 43.0–77.0)
Neutro Abs: 2.6 10*3/uL (ref 1.4–7.7)
PLATELETS: 245 10*3/uL (ref 150.0–400.0)
RBC: 4.53 Mil/uL (ref 3.87–5.11)
RDW: 14.5 % (ref 11.5–15.5)
WBC: 4.5 10*3/uL (ref 4.0–10.5)

## 2017-07-05 LAB — COMPREHENSIVE METABOLIC PANEL
ALT: 13 U/L (ref 0–35)
AST: 18 U/L (ref 0–37)
Albumin: 4.1 g/dL (ref 3.5–5.2)
Alkaline Phosphatase: 101 U/L (ref 39–117)
BILIRUBIN TOTAL: 0.6 mg/dL (ref 0.2–1.2)
BUN: 8 mg/dL (ref 6–23)
CO2: 29 meq/L (ref 19–32)
Calcium: 9.1 mg/dL (ref 8.4–10.5)
Chloride: 102 mEq/L (ref 96–112)
Creatinine, Ser: 0.69 mg/dL (ref 0.40–1.20)
GFR: 92.04 mL/min (ref >60.00)
GLUCOSE: 107 mg/dL — AB (ref 70–99)
Potassium: 3.7 mEq/L (ref 3.5–5.1)
Sodium: 138 mEq/L (ref 135–145)
Total Protein: 7.6 g/dL (ref 6.0–8.3)

## 2017-07-05 LAB — TSH: TSH: 3.09 u[IU]/mL (ref 0.35–4.50)

## 2017-07-05 LAB — HEMOGLOBIN A1C: Hgb A1c MFr Bld: 6.7 % — ABNORMAL HIGH (ref 4.6–6.5)

## 2017-07-05 MED ORDER — ALPRAZOLAM 0.5 MG PO TABS: 0.2500 mg | ORAL_TABLET | Freq: Three times a day (TID) | ORAL | 1 refills | Status: DC | PRN

## 2017-07-05 MED ORDER — METOPROLOL TARTRATE 25 MG PO TABS: 25.0000 mg | ORAL_TABLET | Freq: Two times a day (BID) | ORAL | 3 refills | Status: DC

## 2017-07-05 MED ORDER — LEVOTHYROXINE SODIUM 150 MCG PO TABS: 150.0000 ug | ORAL_TABLET | Freq: Every day | ORAL | 3 refills | Status: DC

## 2017-07-05 NOTE — Patient Instructions (Addendum)
Monitor your BP - the goal is <  140/90.  Test(s) ordered today. Your results will be released to [place] (or called to you) after review, usually within 72hours after test completion. If any changes need to be made, you will be notified at that same time.  All other Health Maintenance issues reviewed.   All recommended immunizations and age-appropriate screenings are up-to-date or discussed.  Flu immunization administered today.   Medications reviewed and updated.  No changes recommended at this time.  Your prescription(s) have been submitted to your pharmacy. Please take as directed and contact our office if you believe you are having problem(s) with the medication(s).  A dexa scan was ordered.  Please followup in 6 months   Health Maintenance, Female Adopting a healthy lifestyle and getting preventive care can go a long way to promote health and wellness. Talk with your health care provider about what schedule of regular examinations is right for you. This is a good chance for you to check in with your provider about disease prevention and staying healthy. In between checkups, there are plenty of things you can do on your own. Experts have done a lot of research about which lifestyle changes and preventive measures are most likely to keep you healthy. Ask your health care provider for more information. Weight and diet Eat a healthy diet  Be sure to include plenty of vegetables, fruits, low-fat dairy products, and lean protein.  Do not eat a lot of foods high in solid fats, added sugars, or salt.  Get regular exercise. This is one of the most important things you can do for your health. ? Most adults should exercise for at least 150 minutes each week. The exercise should increase your heart rate and make you sweat (moderate-intensity exercise). ? Most adults should also do strengthening exercises at least twice a week. This is in addition to the moderate-intensity exercise.  Maintain  a healthy weight  Body mass index (BMI) is a measurement that can be used to identify possible weight problems. It estimates body fat based on height and weight. Your health care provider can help determine your BMI and help you achieve or maintain a healthy weight.  For females 65 years of age and older: ? A BMI below 18.5 is considered underweight. ? A BMI of 18.5 to 24.9 is normal. ? A BMI of 25 to 29.9 is considered overweight. ? A BMI of 30 and above is considered obese.  Watch levels of cholesterol and blood lipids  You should start having your blood tested for lipids and cholesterol at 60 years of age, then have this test every 5 years.  You may need to have your cholesterol levels checked more often if: ? Your lipid or cholesterol levels are high. ? You are older than 60 years of age. ? You are at high risk for heart disease.  Cancer screening Lung Cancer  Lung cancer screening is recommended for adults 38-82 years old who are at high risk for lung cancer because of a history of smoking.  A yearly low-dose CT scan of the lungs is recommended for people who: ? Currently smoke. ? Have quit within the past 15 years. ? Have at least a 30-pack-year history of smoking. A pack year is smoking an average of one pack of cigarettes a day for 1 year.  Yearly screening should continue until it has been 15 years since you quit.  Yearly screening should stop if you develop a health problem  that would prevent you from having lung cancer treatment.  Breast Cancer  Practice breast self-awareness. This means understanding how your breasts normally appear and feel.  It also means doing regular breast self-exams. Let your health care provider know about any changes, no matter how small.  If you are in your 20s or 30s, you should have a clinical breast exam (CBE) by a health care provider every 1-3 years as part of a regular health exam.  If you are 6 or older, have a CBE every year.  Also consider having a breast X-ray (mammogram) every year.  If you have a family history of breast cancer, talk to your health care provider about genetic screening.  If you are at high risk for breast cancer, talk to your health care provider about having an MRI and a mammogram every year.  Breast cancer gene (BRCA) assessment is recommended for women who have family members with BRCA-related cancers. BRCA-related cancers include: ? Breast. ? Ovarian. ? Tubal. ? Peritoneal cancers.  Results of the assessment will determine the need for genetic counseling and BRCA1 and BRCA2 testing.  Cervical Cancer Your health care provider may recommend that you be screened regularly for cancer of the pelvic organs (ovaries, uterus, and vagina). This screening involves a pelvic examination, including checking for microscopic changes to the surface of your cervix (Pap test). You may be encouraged to have this screening done every 3 years, beginning at age 9.  For women ages 33-65, health care providers may recommend pelvic exams and Pap testing every 3 years, or they may recommend the Pap and pelvic exam, combined with testing for human papilloma virus (HPV), every 5 years. Some types of HPV increase your risk of cervical cancer. Testing for HPV may also be done on women of any age with unclear Pap test results.  Other health care providers may not recommend any screening for nonpregnant women who are considered low risk for pelvic cancer and who do not have symptoms. Ask your health care provider if a screening pelvic exam is right for you.  If you have had past treatment for cervical cancer or a condition that could lead to cancer, you need Pap tests and screening for cancer for at least 20 years after your treatment. If Pap tests have been discontinued, your risk factors (such as having a new sexual partner) need to be reassessed to determine if screening should resume. Some women have medical problems  that increase the chance of getting cervical cancer. In these cases, your health care provider may recommend more frequent screening and Pap tests.  Colorectal Cancer  This type of cancer can be detected and often prevented.  Routine colorectal cancer screening usually begins at 60 years of age and continues through 60 years of age.  Your health care provider may recommend screening at an earlier age if you have risk factors for colon cancer.  Your health care provider may also recommend using home test kits to check for hidden blood in the stool.  A small camera at the end of a tube can be used to examine your colon directly (sigmoidoscopy or colonoscopy). This is done to check for the earliest forms of colorectal cancer.  Routine screening usually begins at age 71.  Direct examination of the colon should be repeated every 5-10 years through 60 years of age. However, you may need to be screened more often if early forms of precancerous polyps or small growths are found.  Skin Cancer  Check your skin from head to toe regularly.  Tell your health care provider about any new moles or changes in moles, especially if there is a change in a mole's shape or color.  Also tell your health care provider if you have a mole that is larger than the size of a pencil eraser.  Always use sunscreen. Apply sunscreen liberally and repeatedly throughout the day.  Protect yourself by wearing long sleeves, pants, a wide-brimmed hat, and sunglasses whenever you are outside.  Heart disease, diabetes, and high blood pressure  High blood pressure causes heart disease and increases the risk of stroke. High blood pressure is more likely to develop in: ? People who have blood pressure in the high end of the normal range (130-139/85-89 mm Hg). ? People who are overweight or obese. ? People who are African American.  If you are 73-33 years of age, have your blood pressure checked every 3-5 years. If you are  64 years of age or older, have your blood pressure checked every year. You should have your blood pressure measured twice-once when you are at a hospital or clinic, and once when you are not at a hospital or clinic. Record the average of the two measurements. To check your blood pressure when you are not at a hospital or clinic, you can use: ? An automated blood pressure machine at a pharmacy. ? A home blood pressure monitor.  If you are between 13 years and 20 years old, ask your health care provider if you should take aspirin to prevent strokes.  Have regular diabetes screenings. This involves taking a blood sample to check your fasting blood sugar level. ? If you are at a normal weight and have a low risk for diabetes, have this test once every three years after 60 years of age. ? If you are overweight and have a high risk for diabetes, consider being tested at a younger age or more often. Preventing infection Hepatitis B  If you have a higher risk for hepatitis B, you should be screened for this virus. You are considered at high risk for hepatitis B if: ? You were born in a country where hepatitis B is common. Ask your health care provider which countries are considered high risk. ? Your parents were born in a high-risk country, and you have not been immunized against hepatitis B (hepatitis B vaccine). ? You have HIV or AIDS. ? You use needles to inject street drugs. ? You live with someone who has hepatitis B. ? You have had sex with someone who has hepatitis B. ? You get hemodialysis treatment. ? You take certain medicines for conditions, including cancer, organ transplantation, and autoimmune conditions.  Hepatitis C  Blood testing is recommended for: ? Everyone born from [birth year] through 1964. ? Anyone with known risk factors for hepatitis C.  Sexually transmitted infections (STIs)  You should be screened for sexually transmitted infections (STIs) including gonorrhea and chlamydia  if: ? You are sexually active and are younger than 60 years of age. ? You are older than 60 years of age and your health care provider tells you that you are at risk for this type of infection. ? Your sexual activity has changed since you were last screened and you are at an increased risk for chlamydia or gonorrhea. Ask your health care provider if you are at risk.  If you do not have HIV, but are at risk, it may be recommended that you take a prescription  medicine daily to prevent HIV infection. This is called pre-exposure prophylaxis (PrEP). You are considered at risk if: ? You are sexually active and do not regularly use condoms or know the HIV status of your partner(s). ? You take drugs by injection. ? You are sexually active with a partner who has HIV.  Talk with your health care provider about whether you are at high risk of being infected with HIV. If you choose to begin PrEP, you should first be tested for HIV. You should then be tested every 3 months for as long as you are taking PrEP. Pregnancy  If you are premenopausal and you may become pregnant, ask your health care provider about preconception counseling.  If you may become pregnant, take 400 to 800 micrograms (mcg) of folic acid every day.  If you want to prevent pregnancy, talk to your health care provider about birth control (contraception). Osteoporosis and menopause  Osteoporosis is a disease in which the bones lose minerals and strength with aging. This can result in serious bone fractures. Your risk for osteoporosis can be identified using a bone density scan.  If you are 29 years of age or older, or if you are at risk for osteoporosis and fractures, ask your health care provider if you should be screened.  Ask your health care provider whether you should take a calcium or vitamin D supplement to lower your risk for osteoporosis.  Menopause may have certain physical symptoms and risks.  Hormone replacement therapy  may reduce some of these symptoms and risks. Talk to your health care provider about whether hormone replacement therapy is right for you. Follow these instructions at home:  Schedule regular health, dental, and eye exams.  Stay current with your immunizations.  Do not use any tobacco products including cigarettes, chewing tobacco, or electronic cigarettes.  If you are pregnant, do not drink alcohol.  If you are breastfeeding, limit how much and how often you drink alcohol.  Limit alcohol intake to no more than 1 drink per day for nonpregnant women. One drink equals 12 ounces of beer, 5 ounces of wine, or 1 ounces of hard liquor.  Do not use street drugs.  Do not share needles.  Ask your health care provider for help if you need support or information about quitting drugs.  Tell your health care provider if you often feel depressed.  Tell your health care provider if you have ever been abused or do not feel safe at home. This information is not intended to replace advice given to you by your health care provider. Make sure you discuss any questions you have with your health care provider. Document Released: 01/31/2011 Document Revised: 12/24/2015 Document Reviewed: 04/21/2015 Elsevier Interactive Patient Education  [employer].

## 2017-07-05 NOTE — Assessment & Plan Note (Signed)
Stressed weight loss  Decrease portions, healthy diet, exercise Fu in 6 months

## 2017-07-05 NOTE — Assessment & Plan Note (Signed)
Uses xanax prn - not often No side effects Will continue

## 2017-07-05 NOTE — Assessment & Plan Note (Addendum)
BP elevated today She does not check her BP at home ? Controlled  BP Readings from Last 3 Encounters:  07/05/17 (!) 152/84  06/29/16 (!) 144/86  05/03/16 132/82   She will start checking it  Will continue same medication Goal is < 140/90  cmp

## 2017-07-05 NOTE — Assessment & Plan Note (Signed)
Uses albuterol prn along with mucinex controlled

## 2017-07-05 NOTE — Assessment & Plan Note (Signed)
Check tsh  Titrate med dose if needed  

## 2017-07-05 NOTE — Assessment & Plan Note (Signed)
Chronic intermittent Occasional sciatica Flexeril prn

## 2017-07-05 NOTE — Assessment & Plan Note (Signed)
Check a1c Low sugar / carb diet Stressed regular exercise, weight loss  

## 2017-07-05 NOTE — Assessment & Plan Note (Signed)
Last dexa 2012- ordered Taking vitamin d Stressed regular exercise

## 2017-07-06 ENCOUNTER — Encounter: Payer: Self-pay | Admitting: Internal Medicine

## 2017-07-12 ENCOUNTER — Other Ambulatory Visit: Payer: Self-pay | Admitting: Internal Medicine

## 2017-08-18 ENCOUNTER — Encounter: Payer: Self-pay | Admitting: Internal Medicine

## 2018-01-03 ENCOUNTER — Ambulatory Visit: Payer: BLUE CROSS/BLUE SHIELD | Admitting: Internal Medicine

## 2018-04-17 ENCOUNTER — Other Ambulatory Visit: Payer: Self-pay | Admitting: Internal Medicine

## 2018-04-17 NOTE — Telephone Encounter (Signed)
Last refill was 10/07/17 Last OV was 07/05/17 Next OV not made

## 2018-07-11 NOTE — Patient Instructions (Addendum)
Tests ordered today. Your results will be released to [facility] (or called to you) after review, usually within 72hours after test completion. If any changes need to be made, you will be notified at that same time.  All other Health Maintenance issues reviewed.   All recommended immunizations and age-appropriate screenings are up-to-date or discussed.  Flu and prevnar pneumonia immunizations administered today.   Medications reviewed and updated.  Changes include :  none    Please followup in 6 months   Health Maintenance, Female Adopting a healthy lifestyle and getting preventive care can go a long way to promote health and wellness. Talk with your health care provider about what schedule of regular examinations is right for you. This is a good chance for you to check in with your provider about disease prevention and staying healthy. In between checkups, there are plenty of things you can do on your own. Experts have done a lot of research about which lifestyle changes and preventive measures are most likely to keep you healthy. Ask your health care provider for more information. Weight and diet Eat a healthy diet  Be sure to include plenty of vegetables, fruits, low-fat dairy products, and lean protein.  Do not eat a lot of foods high in solid fats, added sugars, or salt.  Get regular exercise. This is one of the most important things you can do for your health. ? Most adults should exercise for at least 150 minutes each week. The exercise should increase your heart rate and make you sweat (moderate-intensity exercise). ? Most adults should also do strengthening exercises at least twice a week. This is in addition to the moderate-intensity exercise.  Maintain a healthy weight  Body mass index (BMI) is a measurement that can be used to identify possible weight problems. It estimates body fat based on height and weight. Your health care provider can help determine your BMI and help you  achieve or maintain a healthy weight.  For females 64 years of age and older: ? A BMI below 18.5 is considered underweight. ? A BMI of 18.5 to 24.9 is normal. ? A BMI of 25 to 29.9 is considered overweight. ? A BMI of 30 and above is considered obese.  Watch levels of cholesterol and blood lipids  You should start having your blood tested for lipids and cholesterol at 61 years of age, then have this test every 5 years.  You may need to have your cholesterol levels checked more often if: ? Your lipid or cholesterol levels are high. ? You are older than 61 years of age. ? You are at high risk for heart disease.  Cancer screening Lung Cancer  Lung cancer screening is recommended for adults 23-29 years old who are at high risk for lung cancer because of a history of smoking.  A yearly low-dose CT scan of the lungs is recommended for people who: ? Currently smoke. ? Have quit within the past 15 years. ? Have at least a 30-pack-year history of smoking. A pack year is smoking an average of one pack of cigarettes a day for 1 year.  Yearly screening should continue until it has been 15 years since you quit.  Yearly screening should stop if you develop a health problem that would prevent you from having lung cancer treatment.  Breast Cancer  Practice breast self-awareness. This means understanding how your breasts normally appear and feel.  It also means doing regular breast self-exams. Let your health care provider know  about any changes, no matter how small.  If you are in your 20s or 30s, you should have a clinical breast exam (CBE) by a health care provider every 1-3 years as part of a regular health exam.  If you are 55 or older, have a CBE every year. Also consider having a breast X-ray (mammogram) every year.  If you have a family history of breast cancer, talk to your health care provider about genetic screening.  If you are at high risk for breast cancer, talk to your health  care provider about having an MRI and a mammogram every year.  Breast cancer gene (BRCA) assessment is recommended for women who have family members with BRCA-related cancers. BRCA-related cancers include: ? Breast. ? Ovarian. ? Tubal. ? Peritoneal cancers.  Results of the assessment will determine the need for genetic counseling and BRCA1 and BRCA2 testing.  Cervical Cancer Your health care provider may recommend that you be screened regularly for cancer of the pelvic organs (ovaries, uterus, and vagina). This screening involves a pelvic examination, including checking for microscopic changes to the surface of your cervix (Pap test). You may be encouraged to have this screening done every 3 years, beginning at age 75.  For women ages 49-65, health care providers may recommend pelvic exams and Pap testing every 3 years, or they may recommend the Pap and pelvic exam, combined with testing for human papilloma virus (HPV), every 5 years. Some types of HPV increase your risk of cervical cancer. Testing for HPV may also be done on women of any age with unclear Pap test results.  Other health care providers may not recommend any screening for nonpregnant women who are considered low risk for pelvic cancer and who do not have symptoms. Ask your health care provider if a screening pelvic exam is right for you.  If you have had past treatment for cervical cancer or a condition that could lead to cancer, you need Pap tests and screening for cancer for at least 20 years after your treatment. If Pap tests have been discontinued, your risk factors (such as having a new sexual partner) need to be reassessed to determine if screening should resume. Some women have medical problems that increase the chance of getting cervical cancer. In these cases, your health care provider may recommend more frequent screening and Pap tests.  Colorectal Cancer  This type of cancer can be detected and often  prevented.  Routine colorectal cancer screening usually begins at 61 years of age and continues through 61 years of age.  Your health care provider may recommend screening at an earlier age if you have risk factors for colon cancer.  Your health care provider may also recommend using home test kits to check for hidden blood in the stool.  A small camera at the end of a tube can be used to examine your colon directly (sigmoidoscopy or colonoscopy). This is done to check for the earliest forms of colorectal cancer.  Routine screening usually begins at age 56.  Direct examination of the colon should be repeated every 5-10 years through 61 years of age. However, you may need to be screened more often if early forms of precancerous polyps or small growths are found.  Skin Cancer  Check your skin from head to toe regularly.  Tell your health care provider about any new moles or changes in moles, especially if there is a change in a mole's shape or color.  Also tell your health care  provider if you have a mole that is larger than the size of a pencil eraser.  Always use sunscreen. Apply sunscreen liberally and repeatedly throughout the day.  Protect yourself by wearing long sleeves, pants, a wide-brimmed hat, and sunglasses whenever you are outside.  Heart disease, diabetes, and high blood pressure  High blood pressure causes heart disease and increases the risk of stroke. High blood pressure is more likely to develop in: ? People who have blood pressure in the high end of the normal range (130-139/85-89 mm Hg). ? People who are overweight or obese. ? People who are African American.  If you are 44-8 years of age, have your blood pressure checked every 3-5 years. If you are 13 years of age or older, have your blood pressure checked every year. You should have your blood pressure measured twice-once when you are at a hospital or clinic, and once when you are not at a hospital or clinic.  Record the average of the two measurements. To check your blood pressure when you are not at a hospital or clinic, you can use: ? An automated blood pressure machine at a pharmacy. ? A home blood pressure monitor.  If you are between 14 years and 83 years old, ask your health care provider if you should take aspirin to prevent strokes.  Have regular diabetes screenings. This involves taking a blood sample to check your fasting blood sugar level. ? If you are at a normal weight and have a low risk for diabetes, have this test once every three years after 61 years of age. ? If you are overweight and have a high risk for diabetes, consider being tested at a younger age or more often. Preventing infection Hepatitis B  If you have a higher risk for hepatitis B, you should be screened for this virus. You are considered at high risk for hepatitis B if: ? You were born in a country where hepatitis B is common. Ask your health care provider which countries are considered high risk. ? Your parents were born in a high-risk country, and you have not been immunized against hepatitis B (hepatitis B vaccine). ? You have HIV or AIDS. ? You use needles to inject street drugs. ? You live with someone who has hepatitis B. ? You have had sex with someone who has hepatitis B. ? You get hemodialysis treatment. ? You take certain medicines for conditions, including cancer, organ transplantation, and autoimmune conditions.  Hepatitis C  Blood testing is recommended for: ? Everyone born from [birth year] through 1964. ? Anyone with known risk factors for hepatitis C.  Sexually transmitted infections (STIs)  You should be screened for sexually transmitted infections (STIs) including gonorrhea and chlamydia if: ? You are sexually active and are younger than 61 years of age. ? You are older than 61 years of age and your health care provider tells you that you are at risk for this type of infection. ? Your sexual  activity has changed since you were last screened and you are at an increased risk for chlamydia or gonorrhea. Ask your health care provider if you are at risk.  If you do not have HIV, but are at risk, it may be recommended that you take a prescription medicine daily to prevent HIV infection. This is called pre-exposure prophylaxis (PrEP). You are considered at risk if: ? You are sexually active and do not regularly use condoms or know the HIV status of your partner(s). ? You take  drugs by injection. ? You are sexually active with a partner who has HIV.  Talk with your health care provider about whether you are at high risk of being infected with HIV. If you choose to begin PrEP, you should first be tested for HIV. You should then be tested every 3 months for as long as you are taking PrEP. Pregnancy  If you are premenopausal and you may become pregnant, ask your health care provider about preconception counseling.  If you may become pregnant, take 400 to 800 micrograms (mcg) of folic acid every day.  If you want to prevent pregnancy, talk to your health care provider about birth control (contraception). Osteoporosis and menopause  Osteoporosis is a disease in which the bones lose minerals and strength with aging. This can result in serious bone fractures. Your risk for osteoporosis can be identified using a bone density scan.  If you are 28 years of age or older, or if you are at risk for osteoporosis and fractures, ask your health care provider if you should be screened.  Ask your health care provider whether you should take a calcium or vitamin D supplement to lower your risk for osteoporosis.  Menopause may have certain physical symptoms and risks.  Hormone replacement therapy may reduce some of these symptoms and risks. Talk to your health care provider about whether hormone replacement therapy is right for you. Follow these instructions at home:  Schedule regular health, dental,  and eye exams.  Stay current with your immunizations.  Do not use any tobacco products including cigarettes, chewing tobacco, or electronic cigarettes.  If you are pregnant, do not drink alcohol.  If you are breastfeeding, limit how much and how often you drink alcohol.  Limit alcohol intake to no more than 1 drink per day for nonpregnant women. One drink equals 12 ounces of beer, 5 ounces of wine, or 1 ounces of hard liquor.  Do not use street drugs.  Do not share needles.  Ask your health care provider for help if you need support or information about quitting drugs.  Tell your health care provider if you often feel depressed.  Tell your health care provider if you have ever been abused or do not feel safe at home. This information is not intended to replace advice given to you by your health care provider. Make sure you discuss any questions you have with your health care provider. Document Released: 01/31/2011 Document Revised: 12/24/2015 Document Reviewed: 04/21/2015 Elsevier Interactive Patient Education  [employer].

## 2018-07-11 NOTE — Progress Notes (Signed)
Subjective:    Patient ID: Debra Watkins, female    DOB: [birth year], 62 y.o.   MRN: [id number]  HPI She is here for a physical exam.   She denies changes in her history and has no concerns.  She is still helping her elderly mom.  She is 39 yo.     Medications and allergies reviewed with patient and updated if appropriate.  Patient Active Problem List   Diagnosis Date Noted  . Diabetes (HCC) 07/02/2016  . Essential hypertension, benign 06/29/2016  . Chronic lower back pain 06/29/2016  . Mild reactive airways disease 01/16/2015  . DJD (degenerative joint disease) 05/07/2012  . Osteopenia 05/07/2012  . Vitamin D deficiency 02/21/2009  . Hypothyroidism following radioiodine therapy 03/10/2008  . Morbid obesity (HCC) 12/13/2007  . Anxiety state 12/13/2007    Current Outpatient Medications on File Prior to Visit  Medication Sig Dispense Refill  . Cholecalciferol (HM VITAMIN D3) 4000 UNITS CAPS Take 1 capsule by mouth daily.     No current facility-administered medications on file prior to visit.     Past Medical History:  Diagnosis Date  . Anxiety   . Headache(784.0)   . Hypercholesteremia   . Hyperthyroidism    s/p I-131 ablation 10/2007  . Hypothyroidism    post radiation  . Lumbar back pain   . Overweight(278.02)   . Palpitations    related to hyperthyroidism  . Pulmonary nodule    4mm LLL on CXR 09/19/08, not seen on CXR f/u x 5 since  . Unspecified deficiency anemia   . Vitamin D deficiency     Past Surgical History:  Procedure Laterality Date  . CHOLECYSTECTOMY, LAPAROSCOPIC  2010  . therapy for hyperthyroidism  10/2007  . tonsillectomy  1963    Social History   Socioeconomic History  . Marital status: Divorced    Spouse name: Not on file  . Number of children: 0  . Years of education: Not on file  . Highest education level: Not on file  Occupational History  . Not on file  Social Needs  . Financial resource strain: Not on file  . Food  insecurity:    Worry: Not on file    Inability: Not on file  . Transportation needs:    Medical: Not on file    Non-medical: Not on file  Tobacco Use  . Smoking status: Never Smoker  . Smokeless tobacco: Never Used  Substance and Sexual Activity  . Alcohol use: Yes    Comment: social use  . Drug use: No  . Sexual activity: Not on file  Lifestyle  . Physical activity:    Days per week: Not on file    Minutes per session: Not on file  . Stress: Not on file  Relationships  . Social connections:    Talks on phone: Not on file    Gets together: Not on file    Attends religious service: Not on file    Active member of club or organization: Not on file    Attends meetings of clubs or organizations: Not on file    Relationship status: Not on file  Other Topics Concern  . Not on file  Social History Narrative   Pt was adopted   Works office of [employer]   Lives alone with 2 pets   Caring for elderly mother at [facility] (ALF), dad passed 11/2014    Family History  Adopted: Yes  Family history unknown: Yes  Review of Systems  Constitutional: Negative for chills and fever.  Eyes: Negative for visual disturbance.  Respiratory: Positive for cough. Negative for shortness of breath and wheezing.   Cardiovascular: Negative for chest pain, palpitations and leg swelling.  Gastrointestinal: Negative for abdominal pain, blood in stool, constipation, diarrhea and nausea.       No gerd  Genitourinary: Negative for dysuria and hematuria.  Musculoskeletal: Positive for back pain (occasional). Negative for arthralgias.  Skin: Negative for color change and rash.  Neurological: Negative for dizziness, light-headedness and headaches.  Psychiatric/Behavioral: Negative for dysphoric mood. The patient is nervous/anxious.        Objective:   Vitals:   07/12/18 0759  BP: 132/80  Pulse: 71  Resp: 16  Temp: 98 F (36.7 C)  SpO2: 98%   Filed Weights   07/12/18 0759    Weight: 247 lb (112 kg)   Body mass index is 42.4 kg/m.  BP Readings from Last 3 Encounters:  07/12/18 132/80  07/05/17 (!) 152/84  06/29/16 (!) 144/86    Wt Readings from Last 3 Encounters:  07/12/18 247 lb (112 kg)  07/05/17 242 lb (109.8 kg)  06/29/16 241 lb (109.3 kg)     Physical Exam Constitutional: She appears well-developed and well-nourished. No distress.  HENT:  Head: Normocephalic and atraumatic.  Right Ear: External ear normal. Normal ear canal and TM Left Ear: External ear normal.  Normal ear canal and TM Mouth/Throat: Oropharynx is clear and moist.  Eyes: Conjunctivae and EOM are normal.  Neck: Neck supple. No tracheal deviation present. No thyromegaly present.  No carotid bruit  Cardiovascular: Normal rate, regular rhythm and normal heart sounds.   No murmur heard.  No edema. Pulmonary/Chest: Effort normal and breath sounds normal. No respiratory distress. She has no wheezes. She has no rales.  Breast: deferred to Gyn Abdominal: Soft. She exhibits no distension. There is no tenderness.  Lymphadenopathy: She has no cervical adenopathy.  Skin: Skin is warm and dry. She is not diaphoretic.  Psychiatric: She has a normal mood and affect. Her behavior is normal.   Diabetic Foot Exam - Simple   Simple Foot Form Diabetic Foot exam was performed with the following findings:  Yes 07/12/2018  8:38 AM  Visual Inspection No deformities, no ulcerations, no other skin breakdown bilaterally:  Yes Sensation Testing Intact to touch and monofilament testing bilaterally:  Yes Pulse Check Posterior Tibialis and Dorsalis pulse intact bilaterally:  Yes Comments         Assessment & Plan:   Physical exam: Screening blood work ordered Immunizations  Flu vaccine today, pneumonia vaccine today, shingrix discussed Colonoscopy   Up to date  Mammogram  - due - will scheduled Gyn - due - will schedule Dexa  - due - scheduled Eye exams  Up to date - will get  report Exercise   none Weight  Encouraged weight loss Skin    no concerns Substance abuse   none  See Problem List for Assessment and Plan of chronic medical problems.    FU in 6 months

## 2018-07-12 ENCOUNTER — Ambulatory Visit (INDEPENDENT_AMBULATORY_CARE_PROVIDER_SITE_OTHER): Payer: BLUE CROSS/BLUE SHIELD | Admitting: Internal Medicine

## 2018-07-12 ENCOUNTER — Other Ambulatory Visit (INDEPENDENT_AMBULATORY_CARE_PROVIDER_SITE_OTHER): Payer: BLUE CROSS/BLUE SHIELD

## 2018-07-12 ENCOUNTER — Encounter: Payer: Self-pay | Admitting: Internal Medicine

## 2018-07-12 ENCOUNTER — Encounter: Payer: BLUE CROSS/BLUE SHIELD | Admitting: Internal Medicine

## 2018-07-12 VITALS — BP 132/80 | HR 71 | Temp 98.0°F | Resp 16 | Ht 64.0 in | Wt 247.0 lb

## 2018-07-12 DIAGNOSIS — G8929 Other chronic pain: Secondary | ICD-10-CM

## 2018-07-12 DIAGNOSIS — M85862 Other specified disorders of bone density and structure, left lower leg: Secondary | ICD-10-CM

## 2018-07-12 DIAGNOSIS — E119 Type 2 diabetes mellitus without complications: Secondary | ICD-10-CM

## 2018-07-12 DIAGNOSIS — I10 Essential (primary) hypertension: Secondary | ICD-10-CM | POA: Diagnosis not present

## 2018-07-12 DIAGNOSIS — E890 Postprocedural hypothyroidism: Secondary | ICD-10-CM

## 2018-07-12 DIAGNOSIS — Z0000 Encounter for general adult medical examination without abnormal findings: Secondary | ICD-10-CM

## 2018-07-12 DIAGNOSIS — E559 Vitamin D deficiency, unspecified: Secondary | ICD-10-CM

## 2018-07-12 DIAGNOSIS — M85861 Other specified disorders of bone density and structure, right lower leg: Secondary | ICD-10-CM | POA: Diagnosis not present

## 2018-07-12 DIAGNOSIS — Z794 Long term (current) use of insulin: Secondary | ICD-10-CM

## 2018-07-12 DIAGNOSIS — M545 Low back pain: Secondary | ICD-10-CM

## 2018-07-12 DIAGNOSIS — Z23 Encounter for immunization: Secondary | ICD-10-CM

## 2018-07-12 DIAGNOSIS — J4520 Mild intermittent asthma, uncomplicated: Secondary | ICD-10-CM

## 2018-07-12 DIAGNOSIS — F411 Generalized anxiety disorder: Secondary | ICD-10-CM

## 2018-07-12 LAB — CBC WITH DIFFERENTIAL/PLATELET
Basophils Absolute: 0 10*3/uL (ref 0.0–0.1)
Basophils Relative: 0.9 % (ref 0.0–3.0)
EOS PCT: 3 % (ref 0.0–5.0)
Eosinophils Absolute: 0.1 10*3/uL (ref 0.0–0.7)
HEMATOCRIT: 40.1 % (ref 36.0–46.0)
HEMOGLOBIN: 13.4 g/dL (ref 12.0–15.0)
Lymphocytes Relative: 24.1 % (ref 12.0–46.0)
Lymphs Abs: 1.1 10*3/uL (ref 0.7–4.0)
MCHC: 33.5 g/dL (ref 30.0–36.0)
MCV: 91.9 fl (ref 78.0–100.0)
Monocytes Absolute: 0.3 10*3/uL (ref 0.1–1.0)
Monocytes Relative: 7.3 % (ref 3.0–12.0)
Neutro Abs: 2.8 10*3/uL (ref 1.4–7.7)
Neutrophils Relative %: 64.7 % (ref 43.0–77.0)
Platelets: 249 10*3/uL (ref 150.0–400.0)
RBC: 4.37 Mil/uL (ref 3.87–5.11)
RDW: 13.1 % (ref 11.5–15.5)
WBC: 4.4 10*3/uL (ref 4.0–10.5)

## 2018-07-12 LAB — MICROALBUMIN / CREATININE URINE RATIO
Creatinine,U: 118 mg/dL
Microalb Creat Ratio: 3.5 mg/g (ref 0.0–30.0)
Microalb, Ur: 4.2 mg/dL — ABNORMAL HIGH (ref 0.0–1.9)

## 2018-07-12 LAB — COMPREHENSIVE METABOLIC PANEL
ALBUMIN: 4.1 g/dL (ref 3.5–5.2)
ALT: 12 U/L (ref 0–35)
AST: 16 U/L (ref 0–37)
Alkaline Phosphatase: 75 U/L (ref 39–117)
BUN: 9 mg/dL (ref 6–23)
CO2: 29 mEq/L (ref 19–32)
Calcium: 9.5 mg/dL (ref 8.4–10.5)
Chloride: 102 mEq/L (ref 96–112)
Creatinine, Ser: 0.78 mg/dL (ref 0.40–1.20)
GFR: 79.63 mL/min (ref >60.00)
Glucose, Bld: 135 mg/dL — ABNORMAL HIGH (ref 70–99)
Potassium: 4.5 mEq/L (ref 3.5–5.1)
Sodium: 138 mEq/L (ref 135–145)
Total Bilirubin: 0.4 mg/dL (ref 0.2–1.2)
Total Protein: 7.6 g/dL (ref 6.0–8.3)

## 2018-07-12 LAB — LIPID PANEL
CHOL/HDL RATIO: 3
Cholesterol: 162 mg/dL (ref 0–200)
HDL: 55.7 mg/dL (ref >39.00)
LDL Cholesterol: 80 mg/dL (ref 0–99)
NONHDL: 106.47
Triglycerides: 132 mg/dL (ref 0.0–149.0)
VLDL: 26.4 mg/dL (ref 0.0–40.0)

## 2018-07-12 LAB — TSH: TSH: 2.57 u[IU]/mL (ref 0.35–4.50)

## 2018-07-12 LAB — HEMOGLOBIN A1C: Hgb A1c MFr Bld: 6.5 % (ref 4.6–6.5)

## 2018-07-12 LAB — VITAMIN D 25 HYDROXY (VIT D DEFICIENCY, FRACTURES): VITD: 41.09 ng/mL (ref 30.00–100.00)

## 2018-07-12 MED ORDER — ALBUTEROL SULFATE HFA 108 (90 BASE) MCG/ACT IN AERS: INHALATION_SPRAY | RESPIRATORY_TRACT | 5 refills | Status: DC

## 2018-07-12 MED ORDER — LEVOTHYROXINE SODIUM 150 MCG PO TABS: 150.0000 ug | ORAL_TABLET | Freq: Every day | ORAL | 3 refills | Status: DC

## 2018-07-12 MED ORDER — METOPROLOL TARTRATE 25 MG PO TABS: 25.0000 mg | ORAL_TABLET | Freq: Two times a day (BID) | ORAL | 3 refills | Status: DC

## 2018-07-12 MED ORDER — ALPRAZOLAM 0.5 MG PO TABS: ORAL_TABLET | ORAL | 0 refills | Status: DC

## 2018-07-12 MED ORDER — CYCLOBENZAPRINE HCL 10 MG PO TABS: ORAL_TABLET | ORAL | 2 refills | Status: DC

## 2018-07-12 NOTE — Assessment & Plan Note (Signed)
Taking vitamin d daily Check level 

## 2018-07-12 NOTE — Assessment & Plan Note (Signed)
Takes xanax prn only Will continue

## 2018-07-12 NOTE — Assessment & Plan Note (Signed)
Taking vita in d - will check level No exercise Due to dexa - ordered

## 2018-07-12 NOTE — Assessment & Plan Note (Signed)
Check a1c, urine micro Low sugar / carb diet Stressed regular exercise, weight loss Will get DM eye report

## 2018-07-12 NOTE — Addendum Note (Signed)
Addended by: Mercer PodWRENN, Aulani Shipton E on: 07/12/2018 08:54 AM   Modules accepted: Orders

## 2018-07-12 NOTE — Assessment & Plan Note (Signed)
Encouraged weight loss encouraged being as active as possible

## 2018-07-12 NOTE — Assessment & Plan Note (Signed)
Uses proventil prn - does not use frequently continue

## 2018-07-12 NOTE — Assessment & Plan Note (Signed)
Clinically euthyroid Check tsh  Titrate med dose if needed  

## 2018-07-12 NOTE — Assessment & Plan Note (Signed)
BP well controlled Current regimen effective and well tolerated Continue current medications at current doses cmp  

## 2018-07-12 NOTE — Assessment & Plan Note (Signed)
Chronic,intermittent Takes flexeril prn Ok to continue

## 2019-08-06 NOTE — Progress Notes (Signed)
Virtual Visit via telephone note  I connected with Debra Watkins on 08/07/19 at 10:45 AM EST by telephone and verified that I am speaking with the correct person using two identifiers.   I discussed the limitations of evaluation and management by telemedicine and the availability of in person appointments. The patient expressed understanding and agreed to proceed.  Present for the visit:  Myself, Dr Billey Gosling, Amelia Jo.  The patient is currently at work and I am in the office.    No referring provider.    History of Present Illness: This visit is for follow-up of her chronic medical problems.  She is not exercising regularly due to knee pain.     Hypothyroidism:  She is taking her medication daily.  She denies any recent changes in energy or weight that are unexplained.   Hypertension: She is taking her medication daily. She is compliant with a low sodium diet.  She denies chest pain, palpitations, edema, shortness of breath and regular headaches.     Diabetes: She is controlling her sugars with diet.  She is compliant with a low sugar/carbohydrate diet.  Anxiety: She is taking Xanax as needed only, which she does not take often. She denies any side effects from the medication. She feels her anxiety is controlled with as needed medication.  She does need refills of some medications today.  She understands she is overdue for blood work.   Review of Systems  Constitutional: Negative for chills and fever.  Respiratory: Negative for cough, shortness of breath and wheezing.   Cardiovascular: Negative for chest pain, palpitations and leg swelling.  Neurological: Negative for dizziness and headaches.       Social History   Socioeconomic History  . Marital status: Divorced    Spouse name: Not on file  . Number of children: 0  . Years of education: Not on file  . Highest education level: Not on file  Occupational History  . Not on file  Tobacco Use  . Smoking  status: Never Smoker  . Smokeless tobacco: Never Used  Substance and Sexual Activity  . Alcohol use: Yes    Comment: social use  . Drug use: No  . Sexual activity: Not on file  Other Topics Concern  . Not on file  Social History Narrative   Pt was adopted   Works office of central [employer]   Lives alone with 2 pets   Caring for elderly mother at [facility] (ALF), dad passed 11/2014   Social Determinants of Health   Financial Resource Strain:   . Difficulty of Paying Living Expenses: Not on file  Food Insecurity:   . Worried About Charity fundraiser in the Last Year: Not on file  . Ran Out of Food in the Last Year: Not on file  Transportation Needs:   . Lack of Transportation (Medical): Not on file  . Lack of Transportation (Non-Medical): Not on file  Physical Activity:   . Days of Exercise per Week: Not on file  . Minutes of Exercise per Session: Not on file  Stress:   . Feeling of Stress : Not on file  Social Connections:   . Frequency of Communication with Friends and Family: Not on file  . Frequency of Social Gatherings with Friends and Family: Not on file  . Attends Religious Services: Not on file  . Active Member of Clubs or Organizations: Not on file  . Attends Archivist Meetings: Not on file  .  Marital Status: Not on file      Assessment and Plan:  See Problem List for Assessment and Plan of chronic medical problems.   Follow Up Instructions:    I discussed the assessment and treatment plan with the patient. The patient was provided an opportunity to ask questions and all were answered. The patient agreed with the plan and demonstrated an understanding of the instructions.   The patient was advised to call back or seek an in-person evaluation if the symptoms worsen or if the condition fails to improve as anticipated.  Time spent on telephone call:  12 minutes  Pincus Sanes, MD

## 2019-08-07 ENCOUNTER — Telehealth: Payer: Self-pay

## 2019-08-07 ENCOUNTER — Ambulatory Visit (INDEPENDENT_AMBULATORY_CARE_PROVIDER_SITE_OTHER): Payer: 59 | Admitting: Internal Medicine

## 2019-08-07 ENCOUNTER — Encounter: Payer: Self-pay | Admitting: Internal Medicine

## 2019-08-07 DIAGNOSIS — E890 Postprocedural hypothyroidism: Secondary | ICD-10-CM | POA: Diagnosis not present

## 2019-08-07 DIAGNOSIS — I10 Essential (primary) hypertension: Secondary | ICD-10-CM

## 2019-08-07 DIAGNOSIS — Z794 Long term (current) use of insulin: Secondary | ICD-10-CM

## 2019-08-07 DIAGNOSIS — E119 Type 2 diabetes mellitus without complications: Secondary | ICD-10-CM

## 2019-08-07 DIAGNOSIS — F411 Generalized anxiety disorder: Secondary | ICD-10-CM

## 2019-08-07 MED ORDER — METOPROLOL TARTRATE 25 MG PO TABS: 25.0000 mg | ORAL_TABLET | Freq: Two times a day (BID) | ORAL | 0 refills | Status: DC

## 2019-08-07 MED ORDER — LEVOTHYROXINE SODIUM 150 MCG PO TABS: 150.0000 ug | ORAL_TABLET | Freq: Every day | ORAL | 0 refills | Status: DC

## 2019-08-07 NOTE — Assessment & Plan Note (Signed)
Occasional anxiety-takes alprazolam as needed Does not need a refill at this time We will continue as needed Xanax Follow-up in person in 3 months for CPE

## 2019-08-07 NOTE — Assessment & Plan Note (Signed)
Has been diet controlled Feels she is doing well with watching her sugar/carb intake Encourage regular exercise Will follow-up in 3 months for CPE

## 2019-08-07 NOTE — Assessment & Plan Note (Addendum)
Does not monitor blood pressure at home Continue current medication We will set up a physical for 3 months-needs blood work and physical exam Renewed medication

## 2019-08-07 NOTE — Telephone Encounter (Signed)
error 

## 2019-08-07 NOTE — Assessment & Plan Note (Signed)
Clinically sounds to be euthyroid Continue current dose of levothyroxine Follow-up in 3 months for CPE and blood work

## 2019-10-18 ENCOUNTER — Ambulatory Visit: Payer: 59 | Attending: Internal Medicine

## 2019-10-18 DIAGNOSIS — Z23 Encounter for immunization: Secondary | ICD-10-CM

## 2019-10-18 NOTE — Progress Notes (Signed)
   Covid-19 Vaccination Clinic  Name:  MEILANI EDMUNDSON    MRN: [id number] DOB: 04-07-1956  10/18/2019  Ms. Miramontes was observed post Covid-19 immunization for 15 minutes without incident. She was provided with Vaccine Information Sheet and instruction to access the V-Safe system.   Ms. Folz was instructed to call 911 with any severe reactions post vaccine: Marland Kitchen Difficulty breathing  . Swelling of face and throat  . A fast heartbeat  . A bad rash all over body  . Dizziness and weakness   Immunizations Administered    Name Date Dose VIS Date Route   Pfizer COVID-19 Vaccine 10/18/2019  4:31 PM 0.3 mL 07/12/2019 Intramuscular   Manufacturer: [employer], [employer]   Lot: [id number]   NDC: [address]-8

## 2019-11-13 ENCOUNTER — Ambulatory Visit: Payer: 59 | Attending: Internal Medicine

## 2019-11-13 DIAGNOSIS — Z23 Encounter for immunization: Secondary | ICD-10-CM

## 2019-11-13 NOTE — Progress Notes (Signed)
   Covid-19 Vaccination Clinic  Name:  DONNICE NIELSEN    MRN: [id number] DOB: [birth year]  11/13/2019  Ms. Mcgraw was observed post Covid-19 immunization for 15 minutes without incident. She was provided with Vaccine Information Sheet and instruction to access the V-Safe system.   Ms. Dipinto was instructed to call 911 with any severe reactions post vaccine: Marland Kitchen Difficulty breathing  . Swelling of face and throat  . A fast heartbeat  . A bad rash all over body  . Dizziness and weakness   Immunizations Administered    Name Date Dose VIS Date Route   Pfizer COVID-19 Vaccine 11/13/2019  4:08 PM 0.3 mL 07/12/2019 Intramuscular   Manufacturer: [employer], [employer]   Lot: [id number]   NDC: [address]-0

## 2019-11-18 NOTE — Assessment & Plan Note (Addendum)
Chronic Diet controlled Complications include obesity A1c, urine micro Advised regular exercise, low sugar/carb diet Encouraged weight loss

## 2019-11-18 NOTE — Patient Instructions (Addendum)
Blood work was ordered.    All other Health Maintenance issues reviewed.   All recommended immunizations and age-appropriate screenings are up-to-date or discussed.  No immunization administered today.   Medications reviewed and updated.  Changes include :   Start losartan 25 mg daily.  Monitor your BP.   Your prescription(s) have been submitted to your pharmacy. Please take as directed and contact our office if you believe you are having problem(s) with the medication(s).    Please followup in 6 months    Health Maintenance, Female Adopting a healthy lifestyle and getting preventive care are important in promoting health and wellness. Ask your health care provider about:  The right schedule for you to have regular tests and exams.  Things you can do on your own to prevent diseases and keep yourself healthy. What should I know about diet, weight, and exercise? Eat a healthy diet   Eat a diet that includes plenty of vegetables, fruits, low-fat dairy products, and lean protein.  Do not eat a lot of foods that are high in solid fats, added sugars, or sodium. Maintain a healthy weight Body mass index (BMI) is used to identify weight problems. It estimates body fat based on height and weight. Your health care provider can help determine your BMI and help you achieve or maintain a healthy weight. Get regular exercise Get regular exercise. This is one of the most important things you can do for your health. Most adults should:  Exercise for at least 150 minutes each week. The exercise should increase your heart rate and make you sweat (moderate-intensity exercise).  Do strengthening exercises at least twice a week. This is in addition to the moderate-intensity exercise.  Spend less time sitting. Even light physical activity can be beneficial. Watch cholesterol and blood lipids Have your blood tested for lipids and cholesterol at 63 years of age, then have this test every 5  years. Have your cholesterol levels checked more often if:  Your lipid or cholesterol levels are high.  You are older than 63 years of age.  You are at high risk for heart disease. What should I know about cancer screening? Depending on your health history and family history, you may need to have cancer screening at various ages. This may include screening for:  Breast cancer.  Cervical cancer.  Colorectal cancer.  Skin cancer.  Lung cancer. What should I know about heart disease, diabetes, and high blood pressure? Blood pressure and heart disease  High blood pressure causes heart disease and increases the risk of stroke. This is more likely to develop in people who have high blood pressure readings, are of African descent, or are overweight.  Have your blood pressure checked: ? Every 3-5 years if you are 49-81 years of age. ? Every year if you are 52 years old or older. Diabetes Have regular diabetes screenings. This checks your fasting blood sugar level. Have the screening done:  Once every three years after age 64 if you are at a normal weight and have a low risk for diabetes.  More often and at a younger age if you are overweight or have a high risk for diabetes. What should I know about preventing infection? Hepatitis B If you have a higher risk for hepatitis B, you should be screened for this virus. Talk with your health care provider to find out if you are at risk for hepatitis B infection. Hepatitis C Testing is recommended for:  Everyone born from [birth year] through  [place] with known risk factors for hepatitis C. Sexually transmitted infections (STIs)  Get screened for STIs, including gonorrhea and chlamydia, if: ? You are sexually active and are younger than 63 years of age. ? You are older than 62 years of age and your health care provider tells you that you are at risk for this type of infection. ? Your sexual activity has changed since you were last  screened, and you are at increased risk for chlamydia or gonorrhea. Ask your health care provider if you are at risk.  Ask your health care provider about whether you are at high risk for HIV. Your health care provider may recommend a prescription medicine to help prevent HIV infection. If you choose to take medicine to prevent HIV, you should first get tested for HIV. You should then be tested every 3 months for as long as you are taking the medicine. Pregnancy  If you are about to stop having your period (premenopausal) and you may become pregnant, seek counseling before you get pregnant.  Take 400 to 800 micrograms (mcg) of folic acid every day if you become pregnant.  Ask for birth control (contraception) if you want to prevent pregnancy. Osteoporosis and menopause Osteoporosis is a disease in which the bones lose minerals and strength with aging. This can result in bone fractures. If you are 25 years old or older, or if you are at risk for osteoporosis and fractures, ask your health care provider if you should:  Be screened for bone loss.  Take a calcium or vitamin D supplement to lower your risk of fractures.  Be given hormone replacement therapy (HRT) to treat symptoms of menopause. Follow these instructions at home: Lifestyle  Do not use any products that contain nicotine or tobacco, such as cigarettes, e-cigarettes, and chewing tobacco. If you need help quitting, ask your health care provider.  Do not use street drugs.  Do not share needles.  Ask your health care provider for help if you need support or information about quitting drugs. Alcohol use  Do not drink alcohol if: ? Your health care provider tells you not to drink. ? You are pregnant, may be pregnant, or are planning to become pregnant.  If you drink alcohol: ? Limit how much you use to 0-1 drink a day. ? Limit intake if you are breastfeeding.  Be aware of how much alcohol is in your drink. In the [place] one  drink equals one 12 oz bottle of beer (355 mL), one 5 oz glass of wine (148 mL), or one 1 oz glass of hard liquor (44 mL). General instructions  Schedule regular health, dental, and eye exams.  Stay current with your vaccines.  Tell your health care provider if: ? You often feel depressed. ? You have ever been abused or do not feel safe at home. Summary  Adopting a healthy lifestyle and getting preventive care are important in promoting health and wellness.  Follow your health care provider's instructions about healthy diet, exercising, and getting tested or screened for diseases.  Follow your health care provider's instructions on monitoring your cholesterol and blood pressure. This information is not intended to replace advice given to you by your health care provider. Make sure you discuss any questions you have with your health care provider. Document Revised: 07/11/2018 Document Reviewed: 07/11/2018 Elsevier Patient Education  2020 [employer].

## 2019-11-18 NOTE — Progress Notes (Signed)
Subjective:    Patient ID: Debra Watkins, female    DOB: 1956-03-03, 64 y.o.   MRN: [id number]  HPI She is here for a physical exam.   She denies changes in her health.     She is trying to watch the sugars/carbs.  She is not exercising.   Medications and allergies reviewed with patient and updated if appropriate.  Patient Active Problem List   Diagnosis Date Noted  . Diabetes (HCC) 07/02/2016  . Essential hypertension, benign 06/29/2016  . Chronic lower back pain 06/29/2016  . Mild reactive airways disease 01/16/2015  . DJD (degenerative joint disease) 05/07/2012  . Osteopenia 05/07/2012  . Vitamin D deficiency 02/21/2009  . Hypothyroidism following radioiodine therapy 03/10/2008  . Morbid obesity (HCC) 12/13/2007  . Anxiety state 12/13/2007    Current Outpatient Medications on File Prior to Visit  Medication Sig Dispense Refill  . albuterol (PROVENTIL HFA) 108 (90 Base) MCG/ACT inhaler INHALE 2 PUFFS INTO THE LUNGS EVERY 6 HOURS AS NEEDED 20.1 g 5  . ALPRAZolam (XANAX) 0.5 MG tablet TAKE 1/2 TO 1 TABLET(0.25 TO 0.5 MG) BY MOUTH THREE TIMES DAILY AS NEEDED FOR ANXIETY 30 tablet 0  . Cholecalciferol (HM VITAMIN D3) 4000 UNITS CAPS Take 1 capsule by mouth daily.    . cyclobenzaprine (FLEXERIL) 10 MG tablet TAKE 1 TABLET(10 MG) BY MOUTH THREE TIMES DAILY AS NEEDED FOR MUSCLE SPASMS 90 tablet 2  . levothyroxine (SYNTHROID) 150 MCG tablet Take 1 tablet (150 mcg total) by mouth daily before breakfast. 90 tablet 0  . metoprolol tartrate (LOPRESSOR) 25 MG tablet Take 1 tablet (25 mg total) by mouth 2 (two) times daily. 180 tablet 0   No current facility-administered medications on file prior to visit.    Past Medical History:  Diagnosis Date  . Anxiety   . Headache(784.0)   . Hypercholesteremia   . Hyperthyroidism    s/p I-131 ablation 10/2007  . Hypothyroidism    post radiation  . Lumbar back pain   . Overweight(278.02)   . Palpitations    related to  hyperthyroidism  . Pulmonary nodule    67mm LLL on CXR 09/19/08, not seen on CXR f/u x 5 since  . Unspecified deficiency anemia   . Vitamin D deficiency     Past Surgical History:  Procedure Laterality Date  . CHOLECYSTECTOMY, LAPAROSCOPIC  2010  . therapy for hyperthyroidism  10/2007  . tonsillectomy  1963    Social History   Socioeconomic History  . Marital status: Divorced    Spouse name: Not on file  . Number of children: 0  . Years of education: Not on file  . Highest education level: Not on file  Occupational History  . Not on file  Tobacco Use  . Smoking status: Never Smoker  . Smokeless tobacco: Never Used  Substance and Sexual Activity  . Alcohol use: Yes    Comment: social use  . Drug use: No  . Sexual activity: Not on file  Other Topics Concern  . Not on file  Social History Narrative   Pt was adopted   Works office of central [employer]   Lives alone with 2 pets   Caring for elderly mother at [facility] (ALF), dad passed 11/2014   Social Determinants of Health   Financial Resource Strain:   . Difficulty of Paying Living Expenses:   Food Insecurity:   . Worried About Programme researcher, broadcasting/film/video in the Last Year:   .  Ran Out of Food in the Last Year:   Transportation Needs:   . Film/video editor (Medical):   Marland Kitchen Lack of Transportation (Non-Medical):   Physical Activity:   . Days of Exercise per Week:   . Minutes of Exercise per Session:   Stress:   . Feeling of Stress :   Social Connections:   . Frequency of Communication with Friends and Family:   . Frequency of Social Gatherings with Friends and Family:   . Attends Religious Services:   . Active Member of Clubs or Organizations:   . Attends Archivist Meetings:   Marland Kitchen Marital Status:     Family History  Adopted: Yes  Family history unknown: Yes    Review of Systems  Constitutional: Negative for chills and fever.  Eyes: Negative for visual disturbance.  Respiratory: Positive for  cough (chronic, dry). Negative for shortness of breath and wheezing.   Cardiovascular: Negative for chest pain, palpitations and leg swelling.  Gastrointestinal: Negative for abdominal pain, blood in stool, constipation, diarrhea and nausea.       No gerd  Genitourinary: Negative for dysuria and hematuria.  Musculoskeletal: Positive for arthralgias (knees). Negative for back pain.  Skin: Negative for color change and rash.  Neurological: Negative for dizziness, light-headedness, numbness and headaches.  Psychiatric/Behavioral: Negative for dysphoric mood. The patient is nervous/anxious (occ).        Objective:   Vitals:   11/19/19 0822  BP: (!) 158/92  Pulse: 70  Resp: 16  Temp: 97.9 F (36.6 C)  SpO2: 98%   Filed Weights   11/19/19 0822  Weight: 253 lb 12.8 oz (115.1 kg)   Body mass index is 43.56 kg/m.  BP Readings from Last 3 Encounters:  11/19/19 (!) 158/92  07/12/18 132/80  07/05/17 (!) 152/84    Wt Readings from Last 3 Encounters:  11/19/19 253 lb 12.8 oz (115.1 kg)  07/12/18 247 lb (112 kg)  07/05/17 242 lb (109.8 kg)     Physical Exam Constitutional: She appears well-developed and well-nourished. No distress.  HENT:  Head: Normocephalic and atraumatic.  Right Ear: External ear normal. Normal ear canal and TM Left Ear: External ear normal.  Normal ear canal and TM Mouth/Throat: Oropharynx is clear and moist.  Eyes: Conjunctivae and EOM are normal.  Neck: Neck supple. No tracheal deviation present. No thyromegaly present.  No carotid bruit  Cardiovascular: Normal rate, regular rhythm and normal heart sounds.   No murmur heard.  No edema. Pulmonary/Chest: Effort normal and breath sounds normal. No respiratory distress. She has no wheezes. She has no rales.  Breast: deferred   Abdominal: Soft. She exhibits no distension. There is no tenderness.  Lymphadenopathy: She has no cervical adenopathy.  Skin: Skin is warm and dry. She is not diaphoretic.   Psychiatric: She has a normal mood and affect. Her behavior is normal.   Diabetic Foot Exam - Simple   Simple Foot Form Diabetic Foot exam was performed with the following findings: Yes 11/19/2019  8:52 AM  Visual Inspection No deformities, no ulcerations, no other skin breakdown bilaterally: Yes Sensation Testing Intact to touch and monofilament testing bilaterally: Yes Pulse Check Posterior Tibialis and Dorsalis pulse intact bilaterally: Yes Comments          Assessment & Plan:   Physical exam: Screening blood work    ordered Immunizations  Discussed shingrix, pneumovax - unable to have now due to just having had covid #2 vaccine Colonoscopy  Up to date -  due this August Mammogram  Not up to date Gyn  Not up to date Dexa  Due - ordered Eye exams  Not up to date - will reschedule - [place] eye Exercise  None Weight  Encouraged weight loss Substance abuse  none  See Problem List for Assessment and Plan of chronic medical problems.    This visit occurred during the SARS-CoV-2 public health emergency.  Safety protocols were in place, including screening questions prior to the visit, additional usage of staff PPE, and extensive cleaning of exam room while observing appropriate contact time as indicated for disinfecting solutions.

## 2019-11-19 ENCOUNTER — Other Ambulatory Visit: Payer: Self-pay

## 2019-11-19 ENCOUNTER — Encounter: Payer: Self-pay | Admitting: Internal Medicine

## 2019-11-19 ENCOUNTER — Ambulatory Visit (INDEPENDENT_AMBULATORY_CARE_PROVIDER_SITE_OTHER): Payer: 59 | Admitting: Internal Medicine

## 2019-11-19 VITALS — BP 158/92 | HR 70 | Temp 97.9°F | Resp 16 | Ht 64.0 in | Wt 253.8 lb

## 2019-11-19 DIAGNOSIS — F411 Generalized anxiety disorder: Secondary | ICD-10-CM

## 2019-11-19 DIAGNOSIS — M85861 Other specified disorders of bone density and structure, right lower leg: Secondary | ICD-10-CM | POA: Diagnosis not present

## 2019-11-19 DIAGNOSIS — M85862 Other specified disorders of bone density and structure, left lower leg: Secondary | ICD-10-CM

## 2019-11-19 DIAGNOSIS — E890 Postprocedural hypothyroidism: Secondary | ICD-10-CM

## 2019-11-19 DIAGNOSIS — J4520 Mild intermittent asthma, uncomplicated: Secondary | ICD-10-CM

## 2019-11-19 DIAGNOSIS — Z794 Long term (current) use of insulin: Secondary | ICD-10-CM

## 2019-11-19 DIAGNOSIS — I10 Essential (primary) hypertension: Secondary | ICD-10-CM

## 2019-11-19 DIAGNOSIS — Z13820 Encounter for screening for osteoporosis: Secondary | ICD-10-CM

## 2019-11-19 DIAGNOSIS — M170 Bilateral primary osteoarthritis of knee: Secondary | ICD-10-CM

## 2019-11-19 DIAGNOSIS — E559 Vitamin D deficiency, unspecified: Secondary | ICD-10-CM

## 2019-11-19 DIAGNOSIS — E1169 Type 2 diabetes mellitus with other specified complication: Secondary | ICD-10-CM

## 2019-11-19 DIAGNOSIS — Z0000 Encounter for general adult medical examination without abnormal findings: Secondary | ICD-10-CM

## 2019-11-19 LAB — CBC WITH DIFFERENTIAL/PLATELET
Basophils Absolute: 0 10*3/uL (ref 0.0–0.1)
Basophils Relative: 1 % (ref 0.0–3.0)
Eosinophils Absolute: 0.1 10*3/uL (ref 0.0–0.7)
Eosinophils Relative: 3.5 % (ref 0.0–5.0)
HCT: 36.9 % (ref 36.0–46.0)
Hemoglobin: 12 g/dL (ref 12.0–15.0)
Lymphocytes Relative: 25.9 % (ref 12.0–46.0)
Lymphs Abs: 1.1 10*3/uL (ref 0.7–4.0)
MCHC: 32.4 g/dL (ref 30.0–36.0)
MCV: 92.8 fl (ref 78.0–100.0)
Monocytes Absolute: 0.2 10*3/uL (ref 0.1–1.0)
Monocytes Relative: 5.9 % (ref 3.0–12.0)
Neutro Abs: 2.6 10*3/uL (ref 1.4–7.7)
Neutrophils Relative %: 63.7 % (ref 43.0–77.0)
Platelets: 252 10*3/uL (ref 150.0–400.0)
RBC: 3.98 Mil/uL (ref 3.87–5.11)
RDW: 14.1 % (ref 11.5–15.5)
WBC: 4.2 10*3/uL (ref 4.0–10.5)

## 2019-11-19 LAB — COMPREHENSIVE METABOLIC PANEL
ALT: 14 U/L (ref 0–35)
AST: 22 U/L (ref 0–37)
Albumin: 4.1 g/dL (ref 3.5–5.2)
Alkaline Phosphatase: 79 U/L (ref 39–117)
BUN: 6 mg/dL (ref 6–23)
CO2: 29 mEq/L (ref 19–32)
Calcium: 9.1 mg/dL (ref 8.4–10.5)
Chloride: 103 mEq/L (ref 96–112)
Creatinine, Ser: 0.75 mg/dL (ref 0.40–1.20)
GFR: 78.04 mL/min (ref >60.00)
Glucose, Bld: 153 mg/dL — ABNORMAL HIGH (ref 70–99)
Potassium: 4.5 mEq/L (ref 3.5–5.1)
Sodium: 139 mEq/L (ref 135–145)
Total Bilirubin: 0.4 mg/dL (ref 0.2–1.2)
Total Protein: 7.6 g/dL (ref 6.0–8.3)

## 2019-11-19 LAB — MICROALBUMIN / CREATININE URINE RATIO
Creatinine,U: 172.8 mg/dL
Microalb Creat Ratio: 17.7 mg/g (ref 0.0–30.0)
Microalb, Ur: 30.6 mg/dL — ABNORMAL HIGH (ref 0.0–1.9)

## 2019-11-19 LAB — HEMOGLOBIN A1C: Hgb A1c MFr Bld: 6.7 % — ABNORMAL HIGH (ref 4.6–6.5)

## 2019-11-19 LAB — LIPID PANEL
Cholesterol: 158 mg/dL (ref 0–200)
HDL: 42.8 mg/dL (ref >39.00)
LDL Cholesterol: 81 mg/dL (ref 0–99)
NonHDL: 115.01
Total CHOL/HDL Ratio: 4
Triglycerides: 168 mg/dL — ABNORMAL HIGH (ref 0.0–149.0)
VLDL: 33.6 mg/dL (ref 0.0–40.0)

## 2019-11-19 LAB — TSH: TSH: 5.64 u[IU]/mL — ABNORMAL HIGH (ref 0.35–4.50)

## 2019-11-19 LAB — VITAMIN D 25 HYDROXY (VIT D DEFICIENCY, FRACTURES): VITD: 34.02 ng/mL (ref 30.00–100.00)

## 2019-11-19 MED ORDER — LOSARTAN POTASSIUM 25 MG PO TABS: 25.0000 mg | ORAL_TABLET | Freq: Every day | ORAL | 1 refills | Status: DC

## 2019-11-19 NOTE — Assessment & Plan Note (Signed)
Chronic BP not well controlled Start losartan 25 mg daily, continue metoprolol at current dose Discussed consequences of uncontrolled htn Advised to monitor BP at home cmp

## 2019-11-19 NOTE — Assessment & Plan Note (Signed)
Chronic Encouraged regular exercise, weight loss Check labs - tsh, a1c, vitamin d Follow up in 6 months

## 2019-11-19 NOTE — Assessment & Plan Note (Signed)
Chronic, occasional issues Takes albuterol prn - not often continue

## 2019-11-19 NOTE — Assessment & Plan Note (Signed)
Chronic, intermittent Takes xanax as needed, effective Will continue xanax prn

## 2019-11-19 NOTE — Assessment & Plan Note (Signed)
Chronic dexa due - ordered Not exercising Taking vitamin d

## 2019-11-19 NOTE — Assessment & Plan Note (Signed)
Chronic  Clinically euthyroid Check tsh  Titrate med dose if needed  

## 2019-11-19 NOTE — Assessment & Plan Note (Signed)
chronic B/l knees Limits her activity

## 2019-11-19 NOTE — Assessment & Plan Note (Signed)
Chronic Taking vitamin d Has osteopenia Check level

## 2019-11-20 ENCOUNTER — Other Ambulatory Visit: Payer: Self-pay | Admitting: Internal Medicine

## 2019-11-22 NOTE — Telephone Encounter (Signed)
New message:   1.Medication Requested: metoprolol tartrate (LOPRESSOR) 25 MG tablet levothyroxine (SYNTHROID) 150 MCG tablet cyclobenzaprine (FLEXERIL) 10 MG tablet ALPRAZolam (XANAX) 0.5 MG tablet albuterol (PROVENTIL HFA) 108 (90 Base) MCG/ACT inhaler 2. Pharmacy (Name, Street, [place]): [facility] [phone] - [place], [place] - [address] AT [facility] OF [address] & [facility] [address] 3. On Med List: Yes  4. Last Visit with PCP: 11/19/19  5. Next visit date with PCP: 05/20/20  Pt states all of this was to be sent in for her when she came to visit. Please advise. Agent: Please be advised that RX refills may take up to 3 business days. We ask that you follow-up with your pharmacy.

## 2019-11-25 ENCOUNTER — Other Ambulatory Visit: Payer: Self-pay

## 2019-11-25 MED ORDER — LEVOTHYROXINE SODIUM 175 MCG PO TABS: 175.0000 ug | ORAL_TABLET | Freq: Every day | ORAL | 1 refills | Status: DC

## 2019-11-25 NOTE — Telephone Encounter (Signed)
1.Medication Requested:  albuterol (PROVENTIL HFA) 108 (90 Base) MCG/ACT inhaler  ALPRAZolam (XANAX) 0.5 MG tablet  cyclobenzaprine (FLEXERIL) 10 MG tablet  2. Pharmacy (Name, Street, City):[facility] [phone] - [place], [place] - [address] AT [facility] OF [address]  3. On Med List: Yes  4. Last Visit with PCP: 4.20.21   5. Next visit date with PCP: 10.20.21    Agent: Please be advised that RX refills may take up to 3 business days. We ask that you follow-up with your pharmacy.

## 2019-11-26 ENCOUNTER — Other Ambulatory Visit: Payer: Self-pay | Admitting: Internal Medicine

## 2019-11-26 MED ORDER — ALBUTEROL SULFATE HFA 108 (90 BASE) MCG/ACT IN AERS: INHALATION_SPRAY | RESPIRATORY_TRACT | 5 refills | Status: DC

## 2019-11-26 MED ORDER — CYCLOBENZAPRINE HCL 10 MG PO TABS: ORAL_TABLET | ORAL | 2 refills | Status: DC

## 2019-11-26 MED ORDER — ALPRAZOLAM 0.5 MG PO TABS: ORAL_TABLET | ORAL | 0 refills | Status: DC

## 2019-11-26 NOTE — Telephone Encounter (Signed)
Last OV 11/19/19 Next OV 05/20/20 Last RF 07/12/18

## 2019-12-02 ENCOUNTER — Telehealth: Payer: Self-pay

## 2019-12-02 ENCOUNTER — Other Ambulatory Visit: Payer: Self-pay

## 2019-12-02 DIAGNOSIS — E890 Postprocedural hypothyroidism: Secondary | ICD-10-CM

## 2019-12-02 MED ORDER — CYCLOBENZAPRINE HCL 10 MG PO TABS: ORAL_TABLET | ORAL | 2 refills | Status: DC

## 2019-12-02 MED ORDER — ALBUTEROL SULFATE HFA 108 (90 BASE) MCG/ACT IN AERS: INHALATION_SPRAY | RESPIRATORY_TRACT | 5 refills | Status: DC

## 2019-12-02 NOTE — Telephone Encounter (Signed)
Rx sent for 175 mcg. Made pt aware of lab results and to come back in 2 months for thyroid recheck.

## 2019-12-02 NOTE — Telephone Encounter (Signed)
Patient voiced theirs no refills on her medication   1.Medication Requested  levothyroxine (SYNTHROID) 150 MCG tablet levothyroxine (SYNTHROID) 175 MCG tablet  2. Pharmacy (Name, Street, City):[facility] [phone] - [place], [place] - [address] AT [facility] OF [address] & [address]  3. On Med List: Yes   4. Last Visit with PCP: 4.20.2021  5. Next visit date with PCP: 10.20.2021    Agent: Please be advised that RX refills may take up to 3 business days. We ask that you follow-up with your pharmacy.

## 2019-12-11 ENCOUNTER — Inpatient Hospital Stay: Admission: RE | Admit: 2019-12-11 | Payer: 59 | Source: Ambulatory Visit

## 2020-01-22 ENCOUNTER — Other Ambulatory Visit (INDEPENDENT_AMBULATORY_CARE_PROVIDER_SITE_OTHER): Payer: 59

## 2020-01-22 DIAGNOSIS — E890 Postprocedural hypothyroidism: Secondary | ICD-10-CM

## 2020-01-22 LAB — TSH: TSH: 1.91 u[IU]/mL (ref 0.35–4.50)

## 2020-01-23 ENCOUNTER — Other Ambulatory Visit: Payer: Self-pay

## 2020-01-23 MED ORDER — LEVOTHYROXINE SODIUM 175 MCG PO TABS: 175.0000 ug | ORAL_TABLET | Freq: Every day | ORAL | 0 refills | Status: DC

## 2020-02-21 ENCOUNTER — Other Ambulatory Visit: Payer: Self-pay | Admitting: Internal Medicine

## 2020-02-24 ENCOUNTER — Telehealth: Payer: Self-pay | Admitting: Internal Medicine

## 2020-02-24 NOTE — Telephone Encounter (Signed)
New message:   1.Medication Requested: metoprolol tartrate (LOPRESSOR) 25 MG tablet 2. Pharmacy (Name, Street, [place]): [facility] [phone] - [place], [place] - [address] AT [facility] OF [address] & [facility] [address] 3. On Med List: Yes  4. Last Visit with PCP: 11/19/19  5. Next visit date with PCP: 05/20/20  Pt states she will be leaving town early Wednesday morning.  Agent: Please be advised that RX refills may take up to 3 business days. We ask that you follow-up with your pharmacy.

## 2020-05-01 ENCOUNTER — Other Ambulatory Visit: Payer: Self-pay | Admitting: Internal Medicine

## 2020-05-05 ENCOUNTER — Other Ambulatory Visit: Payer: Self-pay | Admitting: Internal Medicine

## 2020-05-20 ENCOUNTER — Ambulatory Visit: Payer: 59 | Admitting: Internal Medicine

## 2020-07-28 ENCOUNTER — Other Ambulatory Visit: Payer: Self-pay | Admitting: Internal Medicine

## 2020-08-19 ENCOUNTER — Other Ambulatory Visit: Payer: Self-pay | Admitting: Internal Medicine

## 2020-10-26 ENCOUNTER — Other Ambulatory Visit: Payer: Self-pay | Admitting: Internal Medicine

## 2020-11-19 ENCOUNTER — Other Ambulatory Visit: Payer: Self-pay | Admitting: Internal Medicine

## 2021-01-25 ENCOUNTER — Other Ambulatory Visit: Payer: Self-pay | Admitting: Internal Medicine

## 2021-02-17 ENCOUNTER — Other Ambulatory Visit: Payer: Self-pay | Admitting: Internal Medicine

## 2021-04-06 ENCOUNTER — Other Ambulatory Visit: Payer: Self-pay | Admitting: Specialist

## 2021-04-06 DIAGNOSIS — M48061 Spinal stenosis, lumbar region without neurogenic claudication: Secondary | ICD-10-CM

## 2021-04-12 ENCOUNTER — Ambulatory Visit
Admission: RE | Admit: 2021-04-12 | Discharge: 2021-04-12 | Disposition: A | Discharge status: Home / Self Care | Payer: 59 | Source: Ambulatory Visit | Attending: Specialist | Admitting: Specialist

## 2021-04-12 DIAGNOSIS — M48061 Spinal stenosis, lumbar region without neurogenic claudication: Secondary | ICD-10-CM

## 2021-04-12 MED ORDER — METHYLPREDNISOLONE ACETATE 40 MG/ML INJ SUSP (RADIOLOG
80.0000 mg | Freq: Once | INTRAMUSCULAR | Status: AC
  Administered 2021-04-12: 80 mg via EPIDURAL

## 2021-04-12 MED ORDER — IOPAMIDOL (ISOVUE-M 200) INJECTION 41%
1.0000 mL | Freq: Once | INTRAMUSCULAR | Status: AC
  Administered 2021-04-12: 1 mL via EPIDURAL

## 2021-04-12 NOTE — Discharge Instructions (Signed)

## 2021-04-22 ENCOUNTER — Other Ambulatory Visit: Payer: Self-pay | Admitting: Internal Medicine

## 2021-05-28 DIAGNOSIS — G894 Chronic pain syndrome: Secondary | ICD-10-CM | POA: Insufficient documentation

## 2021-06-01 ENCOUNTER — Telehealth: Payer: Self-pay | Admitting: Internal Medicine

## 2021-06-01 NOTE — Telephone Encounter (Signed)
Needs an appt - has not been seen in over one year.

## 2021-06-01 NOTE — Telephone Encounter (Signed)
Patient is calling in requesting a referral for rehab  Patient says her spinal stenosis has gotten extremely bad & she is having trouble walking  Please requesting callback when referral has been placed [phone]

## 2021-06-02 NOTE — Telephone Encounter (Signed)
Book patient for 340 on Friday 

## 2021-06-03 ENCOUNTER — Telehealth: Payer: Self-pay | Admitting: Internal Medicine

## 2021-06-03 NOTE — Progress Notes (Signed)
Subjective:    Patient ID: Debra Watkins, female    DOB: [birth year], 65 y.o.   MRN: [id number]  This visit occurred during the SARS-CoV-2 public health emergency.  Safety protocols were in place, including screening questions prior to the visit, additional usage of staff PPE, and extensive cleaning of exam room while observing appropriate contact time as indicated for disinfecting solutions.    HPI The patient is here for an acute visit.  Low back pain - going to [facility].  She was advised she needed surgery, but does not want to have surgery.  She would like to try PT, she was referred, but was not able to go to where she was referred.    She  is currently living with friends and has difficulty getting around.  She needs to be in an assisted living facility - ? Need mores assistance or not - wants to live where her mom lives - friends home.  She is here on a stretcher - brought by EMS - she is primarily in a wheelchair during the day.     She is a poor historian and can not tell me the specifics of her medications.   Medications and allergies reviewed with patient and updated if appropriate.  Patient Active Problem List   Diagnosis Date Noted   Diabetes (HCC) 07/02/2016   Essential hypertension, benign 06/29/2016   Chronic lower back pain 06/29/2016   Mild reactive airways disease 01/16/2015   DJD (degenerative joint disease) 05/07/2012   Osteopenia 05/07/2012   Vitamin D deficiency 02/21/2009   Hypothyroidism following radioiodine therapy 03/10/2008   Morbid obesity (HCC) 12/13/2007   Anxiety state 12/13/2007    Current Outpatient Medications on File Prior to Visit  Medication Sig Dispense Refill   albuterol (VENTOLIN HFA) 108 (90 Base) MCG/ACT inhaler INHALE 2 PUFFS INTO THE LUNGS EVERY 6 HOURS AS NEEDED 20.1 g 5   Cholecalciferol 100 MCG (4000 UT) CAPS Take 1 capsule by mouth daily.     cyclobenzaprine (FLEXERIL) 10 MG tablet TAKE 1 TABLET(10 MG) BY MOUTH THREE  TIMES DAILY AS NEEDED FOR MUSCLE SPASMS 90 tablet 2   gabapentin (NEURONTIN) 300 MG capsule Take by mouth.     levothyroxine (SYNTHROID) 175 MCG tablet TAKE 1 TABLET(175 MCG) BY MOUTH DAILY BEFORE BREAKFAST 90 tablet 0   losartan (COZAAR) 25 MG tablet TAKE 1 TABLET(25 MG) BY MOUTH DAILY 90 tablet 0   meloxicam (MOBIC) 15 MG tablet Take 15 mg by mouth 3 (three) times daily.     methocarbamol (ROBAXIN) 500 MG tablet methocarbamol 500 mg tablet  TAKE 1 TABLET BY MOUTH FOUR TIMES DAILY FOR 5 DAYS     metoprolol tartrate (LOPRESSOR) 25 MG tablet TAKE 1 TABLET(25 MG) BY MOUTH TWICE DAILY 60 tablet 0   oxyCODONE-acetaminophen (PERCOCET/ROXICET) 5-325 MG tablet Take 1 tablet by mouth 3 (three) times daily as needed.     No current facility-administered medications on file prior to visit.    Past Medical History:  Diagnosis Date   Anxiety    Headache(784.0)    Hypercholesteremia    Hyperthyroidism    s/p I-131 ablation 10/2007   Hypothyroidism    post radiation   Lumbar back pain    Overweight(278.02)    Palpitations    related to hyperthyroidism   Pulmonary nodule    16mm LLL on CXR 09/19/08, not seen on CXR f/u x 5 since   Unspecified deficiency anemia    Vitamin D deficiency  Past Surgical History:  Procedure Laterality Date   CHOLECYSTECTOMY, LAPAROSCOPIC  2010   therapy for hyperthyroidism  10/2007   tonsillectomy  1963    Social History   Socioeconomic History   Marital status: Divorced    Spouse name: Not on file   Number of children: 0   Years of education: Not on file   Highest education level: Not on file  Occupational History   Not on file  Tobacco Use   Smoking status: Never   Smokeless tobacco: Never  Substance and Sexual Activity   Alcohol use: Yes    Comment: social use   Drug use: No   Sexual activity: Not on file  Other Topics Concern   Not on file  Social History Narrative   Pt was adopted   Works office of [employer]   Lives alone with  2 pets   Caring for elderly mother at [facility] (ALF), dad passed 11/2014   Social Determinants of Health   Financial Resource Strain: Not on file  Food Insecurity: Not on file  Transportation Needs: Not on file  Physical Activity: Not on file  Stress: Not on file  Social Connections: Not on file    Family History  Adopted: Yes  Family history unknown: Yes    Review of Systems  Constitutional:  Negative for chills and fever.  Respiratory:  Negative for cough, shortness of breath and wheezing.   Cardiovascular:  Positive for leg swelling. Negative for chest pain and palpitations.  Gastrointestinal:  Negative for abdominal pain.  Musculoskeletal:  Positive for back pain and myalgias.  Neurological:  Positive for weakness (b/l legs). Negative for light-headedness and headaches.      Objective:   Vitals:   06/04/21 1552  BP: 116/80  Pulse: 85  Temp: 98.1 F (36.7 C)  SpO2: 97%   BP Readings from Last 3 Encounters:  06/04/21 116/80  04/12/21 119/84  11/19/19 (!) 158/92   Wt Readings from Last 3 Encounters:  11/19/19 253 lb 12.8 oz (115.1 kg)  07/12/18 247 lb (112 kg)  07/05/17 242 lb (109.8 kg)   Body mass index is 43.56 kg/m.   Physical Exam    Constitutional: Appears well-developed and well-nourished. No distress.  Head: Normocephalic and atraumatic.  Neck: Neck supple. No tracheal deviation present. No thyromegaly present.  No cervical lymphadenopathy Cardiovascular: Normal rate, regular rhythm and normal heart sounds.  No murmur heard. No carotid bruit .  2 + b/l LE edema Pulmonary/Chest: Effort normal and breath sounds normal. No respiratory distress. No has no wheezes. No rales.  Skin: Skin is warm and dry. Not diaphoretic.        Assessment & Plan:    She needs blood work done, but our lab has closed for the day and she was brought by EMS .  Will need to get blood work done at her next visit.   See Problem List for Assessment and Plan of  chronic medical problems.

## 2021-06-03 NOTE — Telephone Encounter (Signed)
Virl Son states she is a friend of the patient  Natalia Leatherwood states the patient has an appt 06-04-2021 and she needs to make the provider aware of problems w/ the patient  Natalia Leatherwood is not listed on dpr, she is requesting a call back  No patient information was released to caller  Phone [phone]

## 2021-06-03 NOTE — Telephone Encounter (Signed)
No information released to friend calling in for patient. She knew that she had an appointment coming up and wanted to express some concerns that she and other friends have witnessed. They are extremely concerned and feel like she is not herself and something is going on. There are times where she will not bath or change her clothes anywhere from 3 weeks to 2 weeks. She uses the bathroom all over the house and does not tell anyone or clean it up. She has been living with friends because she can not live alone. She is not eating and barely drinks. Balance is poor. She is not able to move around and just sits in a wheelchair all day. They feel like she needs to be placed in rehab for help. Patient use to be very vocal and quirky but not she barley talks. They wanted you to be aware of the different changes because she states patient will not be forth telling about what is really going on. Friend that she is staying with now is ready to put her out because she has stressed them out so much it is affecting her health.

## 2021-06-04 ENCOUNTER — Encounter: Payer: Self-pay | Admitting: Internal Medicine

## 2021-06-04 ENCOUNTER — Ambulatory Visit (INDEPENDENT_AMBULATORY_CARE_PROVIDER_SITE_OTHER): Payer: 59 | Admitting: Internal Medicine

## 2021-06-04 ENCOUNTER — Other Ambulatory Visit: Payer: Self-pay

## 2021-06-04 VITALS — BP 116/80 | HR 85 | Temp 98.1°F | Ht 64.0 in

## 2021-06-04 DIAGNOSIS — E1169 Type 2 diabetes mellitus with other specified complication: Secondary | ICD-10-CM

## 2021-06-04 DIAGNOSIS — M5440 Lumbago with sciatica, unspecified side: Secondary | ICD-10-CM | POA: Diagnosis not present

## 2021-06-04 DIAGNOSIS — E890 Postprocedural hypothyroidism: Secondary | ICD-10-CM

## 2021-06-04 DIAGNOSIS — Z741 Need for assistance with personal care: Secondary | ICD-10-CM | POA: Diagnosis not present

## 2021-06-04 DIAGNOSIS — G8929 Other chronic pain: Secondary | ICD-10-CM

## 2021-06-04 DIAGNOSIS — I10 Essential (primary) hypertension: Secondary | ICD-10-CM | POA: Diagnosis not present

## 2021-06-04 DIAGNOSIS — Z794 Long term (current) use of insulin: Secondary | ICD-10-CM

## 2021-06-04 NOTE — Patient Instructions (Addendum)
    Flu immunization administered today.     Medications changes include :   none    A referral was ordered for Child psychotherapist.     We will work on getting you into rehab or friends home as soon as possible.

## 2021-06-06 DIAGNOSIS — Z741 Need for assistance with personal care: Secondary | ICD-10-CM | POA: Insufficient documentation

## 2021-06-06 NOTE — Progress Notes (Signed)
Subjective:    Patient ID: Debra Watkins, female    DOB: 1956-02-26, 65 y.o.   MRN: [id number]  This visit occurred during the SARS-CoV-2 public health emergency.  Safety protocols were in place, including screening questions prior to the visit, additional usage of staff PPE, and extensive cleaning of exam room while observing appropriate contact time as indicated for disinfecting solutions.    HPI The patient is here for an acute visit.  Low back pain - going to [facility].  - severe spinal stenosis.  No walking at all for a coupl of monts  [facility] [place].     No diapers.  Occ does not make it.    Can do own meds     Medications and allergies reviewed with patient and updated if appropriate.  Patient Active Problem List   Diagnosis Date Noted   Requires assistance with activities of daily living (ADL) 06/06/2021   Diabetes (HCC) 07/02/2016   Essential hypertension, benign 06/29/2016   Chronic lower back pain 06/29/2016   Mild reactive airways disease 01/16/2015   DJD (degenerative joint disease) 05/07/2012   Osteopenia 05/07/2012   Vitamin D deficiency 02/21/2009   Hypothyroidism following radioiodine therapy 03/10/2008   Morbid obesity (HCC) 12/13/2007   Anxiety state 12/13/2007    Current Outpatient Medications on File Prior to Visit  Medication Sig Dispense Refill   albuterol (VENTOLIN HFA) 108 (90 Base) MCG/ACT inhaler INHALE 2 PUFFS INTO THE LUNGS EVERY 6 HOURS AS NEEDED 20.1 g 5   Cholecalciferol 100 MCG (4000 UT) CAPS Take 1 capsule by mouth daily.     cyclobenzaprine (FLEXERIL) 10 MG tablet TAKE 1 TABLET(10 MG) BY MOUTH THREE TIMES DAILY AS NEEDED FOR MUSCLE SPASMS 90 tablet 2   gabapentin (NEURONTIN) 300 MG capsule Take by mouth.     levothyroxine (SYNTHROID) 175 MCG tablet TAKE 1 TABLET(175 MCG) BY MOUTH DAILY BEFORE BREAKFAST 90 tablet 0   losartan (COZAAR) 25 MG tablet TAKE 1 TABLET(25 MG) BY MOUTH DAILY 90 tablet 0   meloxicam (MOBIC)  15 MG tablet Take 15 mg by mouth 3 (three) times daily.     methocarbamol (ROBAXIN) 500 MG tablet methocarbamol 500 mg tablet  TAKE 1 TABLET BY MOUTH FOUR TIMES DAILY FOR 5 DAYS     metoprolol tartrate (LOPRESSOR) 25 MG tablet TAKE 1 TABLET(25 MG) BY MOUTH TWICE DAILY 60 tablet 0   oxyCODONE-acetaminophen (PERCOCET/ROXICET) 5-325 MG tablet Take 1 tablet by mouth 3 (three) times daily as needed.     No current facility-administered medications on file prior to visit.    Past Medical History:  Diagnosis Date   Anxiety    Headache(784.0)    Hypercholesteremia    Hyperthyroidism    s/p I-131 ablation 10/2007   Hypothyroidism    post radiation   Lumbar back pain    Overweight(278.02)    Palpitations    related to hyperthyroidism   Pulmonary nodule    95mm LLL on CXR 09/19/08, not seen on CXR f/u x 5 since   Unspecified deficiency anemia    Vitamin D deficiency     Past Surgical History:  Procedure Laterality Date   CHOLECYSTECTOMY, LAPAROSCOPIC  2010   therapy for hyperthyroidism  10/2007   tonsillectomy  1963    Social History   Socioeconomic History   Marital status: Divorced    Spouse name: Not on file   Number of children: 0   Years of education: Not on file   Highest  education level: Not on file  Occupational History   Not on file  Tobacco Use   Smoking status: Never   Smokeless tobacco: Never  Substance and Sexual Activity   Alcohol use: Yes    Comment: social use   Drug use: No   Sexual activity: Not on file  Other Topics Concern   Not on file  Social History Narrative   Pt was adopted   Works office of [employer]   Lives alone with 2 pets   Caring for elderly mother at [facility] (ALF), dad passed 11/2014   Social Determinants of Health   Financial Resource Strain: Not on file  Food Insecurity: Not on file  Transportation Needs: Not on file  Physical Activity: Not on file  Stress: Not on file  Social Connections: Not on file    Family  History  Adopted: Yes  Family history unknown: Yes    Review of Systems  Constitutional:  Negative for chills and fever.  Respiratory:  Negative for cough, shortness of breath and wheezing.   Cardiovascular:  Positive for leg swelling. Negative for chest pain and palpitations.  Gastrointestinal:  Negative for abdominal pain, constipation, diarrhea and nausea.  Genitourinary:  Negative for dysuria.  Musculoskeletal:  Positive for back pain. Negative for arthralgias.  Neurological:  Positive for weakness, numbness (RLE > LLE) and headaches.  Psychiatric/Behavioral:  Positive for dysphoric mood.       Objective:   Vitals:   06/04/21 1552  BP: 116/80  Pulse: 85  Temp: 98.1 F (36.7 C)  SpO2: 97%   BP Readings from Last 3 Encounters:  06/04/21 116/80  04/12/21 119/84  11/19/19 (!) 158/92   Wt Readings from Last 3 Encounters:  11/19/19 253 lb 12.8 oz (115.1 kg)  07/12/18 247 lb (112 kg)  07/05/17 242 lb (109.8 kg)   Body mass index is 43.56 kg/m.   Physical Exam         Assessment & Plan:    See Problem List for Assessment and Plan of chronic medical problems.

## 2021-06-06 NOTE — Assessment & Plan Note (Addendum)
Chronic BP well controlled Continue losartan 25 mg daily, metoprolol 25 mg bid

## 2021-06-06 NOTE — Assessment & Plan Note (Signed)
Chronic Diet controlled Need to check a1c when we can - can not be done today

## 2021-06-06 NOTE — Assessment & Plan Note (Signed)
Currently living with friends - can not live on her own -- needs rehab vs assisted living facility - it is hard to know what level of care she needs

## 2021-06-06 NOTE — Assessment & Plan Note (Signed)
Chronic Severe Following with [facility] Needs PT but not able to get there on her own - really needs rehab or assisted living facility asap - currently living with friends Poor historian Needs assistance with ADLs at this time

## 2021-06-06 NOTE — Assessment & Plan Note (Signed)
Chronic Continue levothyroxine 175 mcg daily

## 2021-06-07 ENCOUNTER — Telehealth: Payer: Self-pay | Admitting: Internal Medicine

## 2021-06-07 ENCOUNTER — Telehealth: Payer: Self-pay | Admitting: *Deleted

## 2021-06-07 ENCOUNTER — Other Ambulatory Visit: Payer: Self-pay | Admitting: Internal Medicine

## 2021-06-07 DIAGNOSIS — Z794 Long term (current) use of insulin: Secondary | ICD-10-CM

## 2021-06-07 DIAGNOSIS — Z741 Need for assistance with personal care: Secondary | ICD-10-CM

## 2021-06-07 DIAGNOSIS — E1169 Type 2 diabetes mellitus with other specified complication: Secondary | ICD-10-CM

## 2021-06-07 DIAGNOSIS — G8929 Other chronic pain: Secondary | ICD-10-CM

## 2021-06-07 NOTE — Telephone Encounter (Signed)
Patient called to check on the status of the referral for rehab or assisted living facility. Stated the friend that she is currently living with is ready for her to move out and she needs a place to stay. Relayed I did not see a referral currently. Requested a callback to be updated.   Best contact #: [phone]

## 2021-06-07 NOTE — Telephone Encounter (Signed)
Please call her and let her know I have ordered a Child psychotherapist, nurse and physical therapy/Occupational Therapy to come to her to help evaluate her so that we can get her placed someplace.  Hopefully they will be calling her to schedule something-we will need this assessment to help [place] determine where she can go.  We are trying to do this as quickly as possible.

## 2021-06-07 NOTE — Telephone Encounter (Signed)
See other phone note

## 2021-06-07 NOTE — Telephone Encounter (Signed)
Spoke with patient today and explained to patient multiple times about process and social worker coming out for evaluation.

## 2021-06-07 NOTE — Chronic Care Management (AMB) (Signed)
  Chronic Care Management   Outreach Note  06/07/2021 Name: KEISHANA KLINGER MRN: [id number] DOB: 05-01-1956  CRYSTALYNN MCINERNEY is a 65 y.o. year old female who is a primary care patient of Burns, Bobette Mo, MD. I reached out to Araceli Bouche by phone today in response to a referral sent by Ms. Nathaniel Man primary care provider.  An unsuccessful telephone outreach was attempted today. The patient was referred to the case management team for assistance with care management and care coordination.   Follow Up Plan: The care management team will reach out to the patient again over the next 2 days. If patient returns call to provider office, please advise to call Embedded Care Management Care Guide Misty Stanley at [phone].  Gwenevere Ghazi  Care Guide, Embedded Care Coordination [facility] Management  Direct Dial: [phone]

## 2021-06-08 NOTE — Chronic Care Management (AMB) (Signed)
  Care Management   Note  06/08/2021 Name: Debra Watkins MRN: [id number] DOB: [birth year]  Debra Watkins is a 65 y.o. year old female who is a primary care patient of Burns, Bobette Mo, MD. I reached out to Araceli Bouche by phone today in response to a referral sent by Debra Watkins primary care provider.   Debra Watkins was given information about care management services today including:  Care management services include personalized support from designated clinical staff supervised by her physician, including individualized plan of care and coordination with other care providers 24/7 contact phone numbers for assistance for urgent and routine care needs. The patient may stop care management services at any time by phone call to the office staff.  Patient agreed to services and verbal consent obtained.   Follow up plan: Telephone appointment with care management team member scheduled for:06/09/21  [facility] Guide, Embedded Care Coordination [facility]  Care Management  Direct Dial: [phone]

## 2021-06-08 NOTE — Chronic Care Management (AMB) (Signed)
  Chronic Care Management   Outreach Note  06/08/2021 Name: KEMBA HOPPES MRN: [id number] DOB: 03-Dec-1955  DIANIA CO is a 65 y.o. year old female who is a primary care patient of Burns, Bobette Mo, MD. I reached out to Araceli Bouche by phone today in response to a referral sent by Ms. Nathaniel Man primary care provider.  A telephone outreach was attempted today. Offered to schedule patient with Social Worker on Friday 06/11/21 patient wants sooner appointment. Routed message to Reece Levy Licensed clinical Child psychotherapist. The patient was referred to the case management team for assistance with care management and care coordination.   Follow Up Plan: The care management team will reach out to the patient again over the next 1 days.  If patient returns call to provider office, please advise to call Embedded Care Management Care Guide Misty Stanley* at [phone].Gwenevere Ghazi  Care Guide, Embedded Care Coordination [facility] Management  Direct Dial: [phone]

## 2021-06-08 NOTE — Telephone Encounter (Signed)
Patient called back requesting update from Dr. Lawerance Bach. Pt. Was told that Albin Felling will call her back to follow up.

## 2021-06-09 ENCOUNTER — Ambulatory Visit: Payer: Self-pay | Admitting: *Deleted

## 2021-06-09 ENCOUNTER — Ambulatory Visit: Payer: 59

## 2021-06-09 DIAGNOSIS — I10 Essential (primary) hypertension: Secondary | ICD-10-CM

## 2021-06-09 DIAGNOSIS — Z741 Need for assistance with personal care: Secondary | ICD-10-CM

## 2021-06-09 DIAGNOSIS — Z794 Long term (current) use of insulin: Secondary | ICD-10-CM

## 2021-06-09 DIAGNOSIS — M153 Secondary multiple arthritis: Secondary | ICD-10-CM

## 2021-06-09 DIAGNOSIS — M8580 Other specified disorders of bone density and structure, unspecified site: Secondary | ICD-10-CM

## 2021-06-09 NOTE — Chronic Care Management (AMB) (Signed)
Chronic Care Management    Social Work Note  06/09/2021 Name: Debra Watkins MRN: [id number] DOB: 13-Nov-1955  Debra Watkins is a 65 y.o. year old female who is a primary care patient of Burns, Claudina Lick, MD. The CCM team was consulted to assist the patient with chronic disease management and/or care coordination needs related to:  HTN, Hypothyroidism, DM, Chronic Lower Back Pain .   Engaged with patient by telephone for initial visit in response to provider referral for social work chronic care management and care coordination services.   Consent to Services:  The patient was given the following information about Chronic Care Management services today, agreed to services, and gave verbal consent: 1. CCM service includes personalized support from designated clinical staff supervised by the primary care provider, including individualized plan of care and coordination with other care providers 2. 24/7 contact phone numbers for assistance for urgent and routine care needs. 3. Service will only be billed when office clinical staff spend 20 minutes or more in a month to coordinate care. 4. Only one practitioner may furnish and bill the service in a calendar month. 5.The patient may stop CCM services at any time (effective at the end of the month) by phone call to the office staff. 6. The patient will be responsible for cost sharing (co-pay) of up to 20% of the service fee (after annual deductible is met). Patient agreed to services and consent obtained.  Patient agreed to services and consent obtained.   Assessment: Review of patient past medical history, allergies, medications, and health status, including review of relevant consultants reports was performed today as part of a comprehensive evaluation and provision of chronic care management and care coordination services.     SDOH (Social Determinants of Health) assessments and interventions performed:    Advanced Directives Status: Not  addressed in this encounter.  CCM Care Plan  Allergies  Allergen Reactions   Penicillins     REACTION: rash   Tetanus Toxoid     REACTION: hives    Outpatient Encounter Medications as of 06/09/2021  Medication Sig   albuterol (VENTOLIN HFA) 108 (90 Base) MCG/ACT inhaler INHALE 2 PUFFS INTO THE LUNGS EVERY 6 HOURS AS NEEDED   Cholecalciferol 100 MCG (4000 UT) CAPS Take 1 capsule by mouth daily.   cyclobenzaprine (FLEXERIL) 10 MG tablet TAKE 1 TABLET(10 MG) BY MOUTH THREE TIMES DAILY AS NEEDED FOR MUSCLE SPASMS   gabapentin (NEURONTIN) 300 MG capsule Take by mouth.   levothyroxine (SYNTHROID) 175 MCG tablet TAKE 1 TABLET(175 MCG) BY MOUTH DAILY BEFORE BREAKFAST   losartan (COZAAR) 25 MG tablet TAKE 1 TABLET(25 MG) BY MOUTH DAILY   meloxicam (MOBIC) 15 MG tablet Take 15 mg by mouth 3 (three) times daily.   methocarbamol (ROBAXIN) 500 MG tablet methocarbamol 500 mg tablet  TAKE 1 TABLET BY MOUTH FOUR TIMES DAILY FOR 5 DAYS   metoprolol tartrate (LOPRESSOR) 25 MG tablet TAKE 1 TABLET(25 MG) BY MOUTH TWICE DAILY   oxyCODONE-acetaminophen (PERCOCET/ROXICET) 5-325 MG tablet Take 1 tablet by mouth 3 (three) times daily as needed.   No facility-administered encounter medications on file as of 06/09/2021.    Patient Active Problem List   Diagnosis Date Noted   Requires assistance with activities of daily living (ADL) 06/06/2021   Diabetes ([place]) 07/02/2016   Essential hypertension, benign 06/29/2016   Chronic lower back pain 06/29/2016   Mild reactive airways disease 01/16/2015   DJD (degenerative joint disease) 05/07/2012   Osteopenia 05/07/2012  Vitamin D deficiency 02/21/2009   Hypothyroidism following radioiodine therapy 03/10/2008   Morbid obesity ([place]) 12/13/2007   Anxiety state 12/13/2007    Conditions to be addressed/monitored:  HTN, Hypothyroidism, DM, Chronic Lower Back Pain ; Level of care concerns and Limited access to caregiver  Care Plan : Social Work Care Plan   Updates made by Daneen Schick since 06/09/2021 12:00 AM     Problem: Mobility and Independence      Goal: Mobility and Independence Optimized   Start Date: 06/09/2021  Priority: High  Note:   Current Barriers:  Chronic disease management support and education needs related to  HTN, DM, Chronic Lower Back Pain, Hypothyroidism   Level of care concerns Unable to stay in friends home after Friday - limited support system  Social Worker Clinical Goal(s):  patient will work with SW to identify and address any acute and/or chronic care coordination needs related to the self health management of  HTN, Hypothyroidism, DM, Chronic Lower Back Pain   patient will work with SW to address concerns related to placement  SW Interventions:  Inter-disciplinary care team collaboration (see longitudinal plan of care) Collaboration with Quay Burow, Claudina Lick, MD regarding development and update of comprehensive plan of care as evidenced by provider attestation and co-signature Patient requesting placement into rehab - patient reports she is currently living with a friend who wants the patient to leave by Friday. Patient has lived with this friend for over two months due to being in a wheelchair and unable to care for self Provided education to the patient regarding process for placement including the need for therapy evaluation and authorization for placement Patient became angry with SW and ended the call Performed chart review to note provider placed a referral to home health for PT evaluation to determine if patient is appropriate for rehab Collaboration with referral coordinator to determine a home health agency has yet to pick up patients referral Placed outbound call to the patient to advise her home health referral has yet to be picked up Provided education on the process for placement including therapy evaluation to determine if rehab is appropriate and process for health plan approval Advised the patient  that at this time a home health agency has yet to accept her referral Offered other opportunities to the patient including placement into long term care or option to return to her home with in home caregivers if the patient is agreeable to hire care givers  Patient declines options and continues to request placement into rehab Discussed with the patient that if she feels unsafe or that her needs are not being met SW could outreach county services for assistance Patient discontinued call and hung up Discussed case with clinical supervisor Janalyn Shy RN Care Manager to review interventions to assist patient Collaboration with Dr. Quay Burow to advise of barriers with patient desire for rehab at this time SW will attempt to follow up with the patient over the next 24 hours to review available options to the patient  Patient Goals/Self-Care Activities patient will:   -  Engage with home health to complete PT eval once order accepted  Follow Up Plan:  SW will follow up with the patient over the next 24 hours       Follow Up Plan: SW will follow up with patient by phone over the next 24 hours      Daneen Schick, BSW, CDP Social Worker, Certified Dementia Practitioner [facility] [phone]

## 2021-06-09 NOTE — Chronic Care Management (AMB) (Signed)
Chronic Care Management   CCM RN Visit Note  06/09/2021 Name: Debra Watkins MRN: [id number] DOB: [birth year]  Subjective: Debra Watkins is a 65 y.o. year old female who is a primary care patient of Burns, Claudina Lick, MD. The care management team was consulted for assistance with disease management and care coordination needs.    Collaboration with BSW  for initial visit in response to provider referral for case management and/or care coordination services.   Consent to Services:  The patient was given the following information about Chronic Care Management services today, agreed to services, and gave verbal consent: 1. CCM service includes personalized support from designated clinical staff supervised by the primary care provider, including individualized plan of care and coordination with other care providers 2. 24/7 contact phone numbers for assistance for urgent and routine care needs. 3. Service will only be billed when office clinical staff spend 20 minutes or more in a month to coordinate care. 4. Only one practitioner may furnish and bill the service in a calendar month. 5.The patient may stop CCM services at any time (effective at the end of the month) by phone call to the office staff. 6. The patient will be responsible for cost sharing (co-pay) of up to 20% of the service fee (after annual deductible is met). Patient agreed to services and consent obtained.  Patient agreed to services and verbal consent obtained.   Assessment: Review of patient past medical history, allergies, medications, health status, including review of consultants reports, laboratory and other test data, was performed as part of comprehensive evaluation and provision of chronic care management services.   SDOH (Social Determinants of Health) assessments and interventions performed:    CCM Care Plan  Allergies  Allergen Reactions   Penicillins     REACTION: rash   Tetanus Toxoid     REACTION: hives     Outpatient Encounter Medications as of 06/09/2021  Medication Sig   albuterol (VENTOLIN HFA) 108 (90 Base) MCG/ACT inhaler INHALE 2 PUFFS INTO THE LUNGS EVERY 6 HOURS AS NEEDED   Cholecalciferol 100 MCG (4000 UT) CAPS Take 1 capsule by mouth daily.   cyclobenzaprine (FLEXERIL) 10 MG tablet TAKE 1 TABLET(10 MG) BY MOUTH THREE TIMES DAILY AS NEEDED FOR MUSCLE SPASMS   gabapentin (NEURONTIN) 300 MG capsule Take by mouth.   levothyroxine (SYNTHROID) 175 MCG tablet TAKE 1 TABLET(175 MCG) BY MOUTH DAILY BEFORE BREAKFAST   losartan (COZAAR) 25 MG tablet TAKE 1 TABLET(25 MG) BY MOUTH DAILY   meloxicam (MOBIC) 15 MG tablet Take 15 mg by mouth 3 (three) times daily.   methocarbamol (ROBAXIN) 500 MG tablet methocarbamol 500 mg tablet  TAKE 1 TABLET BY MOUTH FOUR TIMES DAILY FOR 5 DAYS   metoprolol tartrate (LOPRESSOR) 25 MG tablet TAKE 1 TABLET(25 MG) BY MOUTH TWICE DAILY   oxyCODONE-acetaminophen (PERCOCET/ROXICET) 5-325 MG tablet Take 1 tablet by mouth 3 (three) times daily as needed.   No facility-administered encounter medications on file as of 06/09/2021.    Patient Active Problem List   Diagnosis Date Noted   Requires assistance with activities of daily living (ADL) 06/06/2021   Diabetes ([place]) 07/02/2016   Essential hypertension, benign 06/29/2016   Chronic lower back pain 06/29/2016   Mild reactive airways disease 01/16/2015   DJD (degenerative joint disease) 05/07/2012   Osteopenia 05/07/2012   Vitamin D deficiency 02/21/2009   Hypothyroidism following radioiodine therapy 03/10/2008   Morbid obesity ([place]) 12/13/2007   Anxiety state 12/13/2007    Conditions  to be addressed/monitored:HTN, DMII, Osteopenia, and DJD  Care Plan : RN Care Manager Plan of Care  Updates made by Clerance Lav, RN since 06/09/2021 12:00 AM     Problem: Chronic Disease Management and Care Coordination Needs in a Patient with DJD, Osteopenia, HTN, and DMII   Priority: High     Long-Range Goal:  Development of Plan of Care for long term management of patient with mutliple co-morbid conditions and ADL/IADL barriers (DJD, Osteopenia, HTN, DMII)   Start Date: 06/09/2021  Expected End Date: 09/07/2021  Priority: High  Note:   Current Barriers:  Care Coordination needs related to Level of care concerns and ADL IADL limitations  Chronic Disease Management support and education needs related to HTN, DMII, and DJD, Osteopenia  RNCM Clinical Goal(s):  Patient will verbalize basic understanding of HTN, DMII, Osteopenia, and DJD disease process and self health management plan as evidenced by patient and caregiver verbal report of understanding of plan of care work with Education officer, museum to address Level of care concerns and ADL IADL limitations related to the management of HTN, DMII, Osteopenia, and DJD as evidenced by review of EMR and patient or Education officer, museum report     through collaboration with Consulting civil engineer, provider, and care team.   Interventions: 1:1 collaboration with primary care provider regarding development and update of comprehensive plan of care as evidenced by provider attestation and co-signature Inter-disciplinary care team collaboration (see longitudinal plan of care) Evaluation of current treatment plan related to  self management and patient's adherence to plan as established by provider   Interdisciplinary Collaboration:  (Status: New goal.) Long Term Goal  Collaborated with BSW to initiate plan of care to address needs related to Level of care concerns and ADL IADL limitations in patient with HTN, DM, and osteopenia and DJD Collaboration with Binnie Rail, MD regarding development and update of comprehensive plan of care as evidenced by provider attestation and co-signature Inter-disciplinary care team collaboration (see longitudinal plan of care)  Patient Goals/Self-Care Activities: Patient will attend all scheduled provider appointments as evidenced by clinician review of  documented attendance to scheduled appointments and patient/caregiver report Patient will call provider office for new concerns or questions as evidenced by review of documented incoming telephone call notes and patient report Patient will work with BSW to address care coordination needs and will continue to work with the clinical team to address health care and disease management related needs as evidenced by documented adherence to scheduled care management/care coordination appointments       Plan: patient/caregiver will work with BSW to address level of care needs; clinical team will follow up on patient progress and long term disease management and level of care needs  [place] Medicaid Direct Dial:  [phone]  Fax: [phone]

## 2021-06-09 NOTE — Patient Instructions (Signed)
Visit Information   PATIENT GOALS/PLAN OF CARE:  Care Plan : RN Care Manager Plan of Care  Updates made by Clerance Lav, RN since 06/09/2021 12:00 AM     Problem: Chronic Disease Management and Care Coordination Needs in a Patient with DJD, Osteopenia, HTN, and DMII   Priority: High     Long-Range Goal: Development of Plan of Care for long term management of patient with mutliple co-morbid conditions and ADL/IADL barriers (DJD, Osteopenia, HTN, DMII)   Start Date: 06/09/2021  Expected End Date: 09/07/2021  Priority: High  Note:   Current Barriers:  Care Coordination needs related to Level of care concerns and ADL IADL limitations  Chronic Disease Management support and education needs related to HTN, DMII, and DJD, Osteopenia  RNCM Clinical Goal(s):  Patient will verbalize basic understanding of HTN, DMII, Osteopenia, and DJD disease process and self health management plan as evidenced by patient and caregiver verbal report of understanding of plan of care work with Education officer, museum to address Level of care concerns and ADL IADL limitations related to the management of HTN, DMII, Osteopenia, and DJD as evidenced by review of EMR and patient or Education officer, museum report     through collaboration with Consulting civil engineer, provider, and care team.   Interventions: 1:1 collaboration with primary care provider regarding development and update of comprehensive plan of care as evidenced by provider attestation and co-signature Inter-disciplinary care team collaboration (see longitudinal plan of care) Evaluation of current treatment plan related to  self management and patient's adherence to plan as established by provider   Interdisciplinary Collaboration:  (Status: New goal.) Long Term Goal  Collaborated with BSW to initiate plan of care to address needs related to Level of care concerns and ADL IADL limitations in patient with HTN, DM, and osteopenia and DJD Collaboration with Binnie Rail, MD  regarding development and update of comprehensive plan of care as evidenced by provider attestation and co-signature Inter-disciplinary care team collaboration (see longitudinal plan of care)  Patient Goals/Self-Care Activities: Patient will attend all scheduled provider appointments as evidenced by clinician review of documented attendance to scheduled appointments and patient/caregiver report Patient will call provider office for new concerns or questions as evidenced by review of documented incoming telephone call notes and patient report Patient will work with BSW to address care coordination needs and will continue to work with the clinical team to address health care and disease management related needs as evidenced by documented adherence to scheduled care management/care coordination appointments       Consent to CCM Services: Ms. Better was given information about Chronic Care Management services including:  CCM service includes personalized support from designated clinical staff supervised by her physician, including individualized plan of care and coordination with other care providers 24/7 contact phone numbers for assistance for urgent and routine care needs. Service will only be billed when office clinical staff spend 20 minutes or more in a month to coordinate care. Only one practitioner may furnish and bill the service in a calendar month. The patient may stop CCM services at any time (effective at the end of the month) by phone call to the office staff. The patient will be responsible for cost sharing (co-pay) of up to 20% of the service fee (after annual deductible is met).  Patient agreed to services and verbal consent obtained.   Patient/caregiver will receive instructions as outlined in BSW note.  Follow up on cadence established by BSW based on patient needs  [place]  High Risk  Managed Medicaid Direct Dial:  [phone]  Fax: [phone]

## 2021-06-09 NOTE — Patient Instructions (Signed)
Social Worker Visit Information  Goals we discussed today:   Goals Addressed             This Visit's Progress    Mobility and Independence Optimized       Timeframe:  Short-Term Goal Priority:  High Start Date:  11.9.22                            Patient Goals/Self-Care Activities patient will:   - Engage with home health to complete PT eval once order accepted         Materials provided: Verbal education about placement process provided by phone  Debra Watkins was given information about Chronic Care Management services today including:  CCM service includes personalized support from designated clinical staff supervised by her physician, including individualized plan of care and coordination with other care providers 24/7 contact phone numbers for assistance for urgent and routine care needs. Service will only be billed when office clinical staff spend 20 minutes or more in a month to coordinate care. Only one practitioner may furnish and bill the service in a calendar month. The patient may stop CCM services at any time (effective at the end of the month) by phone call to the office staff. The patient will be responsible for cost sharing (co-pay) of up to 20% of the service fee (after annual deductible is met).  Patient agreed to services and verbal consent obtained.   The patient verbalized understanding of instructions, educational materials, and care plan provided today and declined offer to receive copy of patient instructions, educational materials, and care plan.   Follow up plan: SW will follow up with patient by phone over the next 24 hours   Daneen Schick, BSW, CDP Social Worker, Certified Dementia Practitioner [place] [facility] [phone]

## 2021-06-10 ENCOUNTER — Other Ambulatory Visit: Payer: Self-pay | Admitting: Internal Medicine

## 2021-06-10 ENCOUNTER — Emergency Department (HOSPITAL_COMMUNITY)
Admission: EM | Admit: 2021-06-10 | Discharge: 2021-06-15 | Disposition: A | Discharge status: Home / Self Care | Payer: 59 | Attending: Emergency Medicine | Admitting: Emergency Medicine

## 2021-06-10 ENCOUNTER — Encounter (HOSPITAL_COMMUNITY): Payer: Self-pay

## 2021-06-10 ENCOUNTER — Ambulatory Visit: Payer: Self-pay

## 2021-06-10 ENCOUNTER — Other Ambulatory Visit: Payer: Self-pay

## 2021-06-10 DIAGNOSIS — E039 Hypothyroidism, unspecified: Secondary | ICD-10-CM | POA: Diagnosis not present

## 2021-06-10 DIAGNOSIS — Z79899 Other long term (current) drug therapy: Secondary | ICD-10-CM | POA: Diagnosis not present

## 2021-06-10 DIAGNOSIS — M545 Low back pain, unspecified: Secondary | ICD-10-CM | POA: Diagnosis not present

## 2021-06-10 DIAGNOSIS — Z602 Problems related to living alone: Secondary | ICD-10-CM | POA: Diagnosis not present

## 2021-06-10 DIAGNOSIS — I10 Essential (primary) hypertension: Secondary | ICD-10-CM | POA: Insufficient documentation

## 2021-06-10 DIAGNOSIS — Z9189 Other specified personal risk factors, not elsewhere classified: Secondary | ICD-10-CM

## 2021-06-10 DIAGNOSIS — G8929 Other chronic pain: Secondary | ICD-10-CM

## 2021-06-10 DIAGNOSIS — J45909 Unspecified asthma, uncomplicated: Secondary | ICD-10-CM | POA: Insufficient documentation

## 2021-06-10 DIAGNOSIS — Z794 Long term (current) use of insulin: Secondary | ICD-10-CM

## 2021-06-10 DIAGNOSIS — E119 Type 2 diabetes mellitus without complications: Secondary | ICD-10-CM | POA: Insufficient documentation

## 2021-06-10 DIAGNOSIS — Z20822 Contact with and (suspected) exposure to covid-19: Secondary | ICD-10-CM | POA: Diagnosis not present

## 2021-06-10 DIAGNOSIS — Z741 Need for assistance with personal care: Secondary | ICD-10-CM

## 2021-06-10 LAB — CBC WITH DIFFERENTIAL/PLATELET
Abs Immature Granulocytes: 0.04 10*3/uL (ref 0.00–0.07)
Basophils Absolute: 0.1 10*3/uL (ref 0.0–0.1)
Basophils Relative: 1 %
Eosinophils Absolute: 0.1 10*3/uL (ref 0.0–0.5)
Eosinophils Relative: 2 %
HCT: 37 % (ref 36.0–46.0)
Hemoglobin: 12.6 g/dL (ref 12.0–15.0)
Immature Granulocytes: 1 %
Lymphocytes Relative: 17 %
Lymphs Abs: 1.2 10*3/uL (ref 0.7–4.0)
MCH: 32.4 pg (ref 26.0–34.0)
MCHC: 34.1 g/dL (ref 30.0–36.0)
MCV: 95.1 fL (ref 80.0–100.0)
Monocytes Absolute: 0.3 10*3/uL (ref 0.1–1.0)
Monocytes Relative: 5 %
Neutro Abs: 5.3 10*3/uL (ref 1.7–7.7)
Neutrophils Relative %: 74 %
Platelets: 183 10*3/uL (ref 150–400)
RBC: 3.89 MIL/uL (ref 3.87–5.11)
RDW: 13.6 % (ref 11.5–15.5)
WBC: 7 10*3/uL (ref 4.0–10.5)
nRBC: 0 % (ref 0.0–0.2)

## 2021-06-10 LAB — BASIC METABOLIC PANEL
Anion gap: 13 (ref 5–15)
BUN: 14 mg/dL (ref 8–23)
CO2: 21 mmol/L — ABNORMAL LOW (ref 22–32)
Calcium: 9.3 mg/dL (ref 8.9–10.3)
Chloride: 98 mmol/L (ref 98–111)
Creatinine, Ser: 0.86 mg/dL (ref 0.44–1.00)
GFR, Estimated: >60 mL/min (ref >60)
Glucose, Bld: 107 mg/dL — ABNORMAL HIGH (ref 70–99)
Potassium: 3.4 mmol/L — ABNORMAL LOW (ref 3.5–5.1)
Sodium: 132 mmol/L — ABNORMAL LOW (ref 135–145)

## 2021-06-10 LAB — RESP PANEL BY RT-PCR (FLU A&B, COVID) ARPGX2
Influenza A by PCR: NEGATIVE
Influenza B by PCR: NEGATIVE
SARS Coronavirus 2 by RT PCR: NEGATIVE

## 2021-06-10 MED ORDER — METHOCARBAMOL 500 MG PO TABS: 1000.0000 mg | ORAL_TABLET | Freq: Two times a day (BID) | ORAL | 0 refills | Status: DC

## 2021-06-10 MED ORDER — IBUPROFEN 600 MG PO TABS: 600.0000 mg | ORAL_TABLET | Freq: Four times a day (QID) | ORAL | 0 refills | Status: DC | PRN

## 2021-06-10 MED ORDER — HYDROCODONE-ACETAMINOPHEN 5-325 MG PO TABS: 1.0000 | ORAL_TABLET | ORAL | 0 refills | Status: DC | PRN

## 2021-06-10 MED ORDER — HYDROCODONE-ACETAMINOPHEN 5-325 MG PO TABS
1.0000 | ORAL_TABLET | Freq: Once | ORAL | Status: AC
  Administered 2021-06-10: 1 via ORAL
  Filled 2021-06-10: qty 1

## 2021-06-10 MED ORDER — LIDOCAINE 5 % EX PTCH: 1.0000 | MEDICATED_PATCH | Freq: Every day | CUTANEOUS | 0 refills | Status: DC | PRN

## 2021-06-10 MED ORDER — LIDOCAINE 5 % EX PTCH
1.0000 | MEDICATED_PATCH | CUTANEOUS | Status: DC
  Administered 2021-06-10 – 2021-06-15 (×6): 1 via TRANSDERMAL
  Filled 2021-06-10 (×7): qty 1

## 2021-06-10 MED ORDER — METHOCARBAMOL 500 MG PO TABS
500.0000 mg | ORAL_TABLET | Freq: Once | ORAL | Status: AC
  Administered 2021-06-10: 500 mg via ORAL
  Filled 2021-06-10: qty 1

## 2021-06-10 MED ORDER — IBUPROFEN 800 MG PO TABS
800.0000 mg | ORAL_TABLET | Freq: Once | ORAL | Status: AC
  Administered 2021-06-10: 800 mg via ORAL
  Filled 2021-06-10: qty 1

## 2021-06-10 NOTE — Discharge Instructions (Addendum)
You are seen in the ER for back pain, safety issues at your home.  You have been medically cleared, and our social work team has been trying to assist you in placement.  Continue taking your home medications.  Please follow-up with your Primary Care Physician within the next week. Please take your medications as instructed and discuss any changes to your medications with your primary care physician.    Please return to the Emergency Department if you have any leg numbness, leg weakness, difficulty walking, fevers, worsening of pain, lightheadedness, lose consciousness, severe abdominal pain, severe headache, difficulty urinating, or difficulty having a bowel movement.   Please return to the emergency department immediately for any new or concerning symptoms, or if you get worse.

## 2021-06-10 NOTE — Chronic Care Management (AMB) (Signed)
Social Work Note  06/10/2021 Name: Debra Watkins MRN: [id number] DOB: Mar 04, 1956  Debra Watkins is a 65 y.o. year old female who is a primary care patient of Burns, Bobette Mo, MD.  The Care Management team was consulted for assistance with chronic disease management and care coordination needs.  Debra Watkins was given information about Care Management services today including:  Care Management services include personalized support from designated clinical staff supervised by her physician, including individualized plan of care and coordination with other care providers 24/7 contact phone numbers for assistance for urgent and routine care needs. The patient may stop care management services at any time (effective at the end of the month) by phone call to the office staff.  Patient agreed to services and consent obtained.   Engaged with patient by telephone for follow up visit in response to provider referral for social work chronic care management and care coordination services.  Assessment: Review of patient past medical history, allergies, medications, and health status, including review of pertinent consultant reports was performed as part of comprehensive evaluation and provision of care management/care coordination services.   SDOH (Social Determinants of Health) assessments and interventions performed:  No    Advanced Directives Status: Not addressed in this encounter.  Care Plan  Allergies  Allergen Reactions   Penicillins     REACTION: rash   Tetanus Toxoid     REACTION: hives    Outpatient Encounter Medications as of 06/10/2021  Medication Sig   albuterol (VENTOLIN HFA) 108 (90 Base) MCG/ACT inhaler INHALE 2 PUFFS INTO THE LUNGS EVERY 6 HOURS AS NEEDED   Cholecalciferol 100 MCG (4000 UT) CAPS Take 1 capsule by mouth daily.   cyclobenzaprine (FLEXERIL) 10 MG tablet TAKE 1 TABLET(10 MG) BY MOUTH THREE TIMES DAILY AS NEEDED FOR MUSCLE SPASMS   gabapentin (NEURONTIN)  300 MG capsule Take by mouth.   levothyroxine (SYNTHROID) 175 MCG tablet TAKE 1 TABLET(175 MCG) BY MOUTH DAILY BEFORE BREAKFAST   losartan (COZAAR) 25 MG tablet TAKE 1 TABLET(25 MG) BY MOUTH DAILY   meloxicam (MOBIC) 15 MG tablet Take 15 mg by mouth 3 (three) times daily.   methocarbamol (ROBAXIN) 500 MG tablet methocarbamol 500 mg tablet  TAKE 1 TABLET BY MOUTH FOUR TIMES DAILY FOR 5 DAYS   metoprolol tartrate (LOPRESSOR) 25 MG tablet TAKE 1 TABLET(25 MG) BY MOUTH TWICE DAILY   oxyCODONE-acetaminophen (PERCOCET/ROXICET) 5-325 MG tablet Take 1 tablet by mouth 3 (three) times daily as needed.   No facility-administered encounter medications on file as of 06/10/2021.    Patient Active Problem List   Diagnosis Date Noted   Requires assistance with activities of daily living (ADL) 06/06/2021   Diabetes (HCC) 07/02/2016   Essential hypertension, benign 06/29/2016   Chronic lower back pain 06/29/2016   Mild reactive airways disease 01/16/2015   DJD (degenerative joint disease) 05/07/2012   Osteopenia 05/07/2012   Vitamin D deficiency 02/21/2009   Hypothyroidism following radioiodine therapy 03/10/2008   Morbid obesity (HCC) 12/13/2007   Anxiety state 12/13/2007    Conditions to be addressed/monitored: HTN, DMII, and Chronic lower back pain ; Level of care concerns and ADL IADL limitations  Care Plan : Social Work Care Plan  Updates made by Bevelyn Ngo since 06/10/2021 12:00 AM     Problem: Mobility and Independence      Goal: Mobility and Independence Optimized   Start Date: 06/09/2021  Priority: High  Note:   Current Barriers:  Chronic disease management support and  education needs related to  HTN, DM, Chronic Lower Back Pain, Hypothyroidism   Level of care concerns Unable to stay in friends home after Friday - limited support system  Social Worker Clinical Goal(s):  patient will work with SW to identify and address any acute and/or chronic care coordination needs related  to the self health management of  HTN, Hypothyroidism, DM, Chronic Lower Back Pain   patient will work with SW to address concerns related to placement  SW Interventions:  Inter-disciplinary care team collaboration (see longitudinal plan of care) Collaboration with Lawerance Bach, Bobette Mo, MD regarding development and update of comprehensive plan of care as evidenced by provider attestation and co-signature Outbound call placed to the patient to assess decision on resources previously offered by SW Determined patient plans to return to her home but will need assistance obtaining a shower chair, raised toilet seat, and wheelchair  Advised the patient SW will collaborate with Dr. Lawerance Bach to request orders for the patient Encouraged the patient to bring the wheelchair she is currently borrowing from a friend home if possible as it may take some time for insurance to approve orders Discussed opportunity for patient to hire a caregiver to come to her home to assist with ADL needs Patient requests a list of agencies be mailed to her home address - patient declined verbal education or e-mailed education of resources at this time Patient continues to request placement into SNF for rehab Reviewed SW unable to place patient into SNF at this time due to lack of documentation indicating it is needed at this time Discussed the patient is followed by Dr. Shon Baton with [facility] and recently referred patient to aquatic therapy - patient reports she has yet to initiate this therapy Discussed SW will contact Dr. Shon Baton office to request feedback regarding patient LOC needs and determine if he feels inpatient rehab is appropriate Outbound call placed to [facility], voice message left on provider line requesting feedback Collaboration with primary care provider Dr. Lawerance Bach to advise of patient plan to return home - requested orders for shower chair, 3 in 1 commode, and wheelchair. SW also requested orders for home health PT, OT,  aid, and RN  Collaboration with scheduling care guide to request patient be scheduled for follow up with embedded RN Care Manager to address ongoing care management needs  Patient Goals/Self-Care Activities patient will:   -  Engage with home health to complete PT eval once order accepted  Follow Up Plan:  SW will follow up with the patient over the next 7 days       Follow Up Plan: SW will follow up with patient by phone over the next weeks      Bevelyn Ngo, BSW, CDP Social Worker, Certified Dementia Practitioner [facility] Care Management [phone]
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
## 2021-06-10 NOTE — ED Notes (Signed)
Patient is
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
## 2021-06-10 NOTE — Progress Notes (Signed)
TOC CSW received the following email from [facility] Rodriguez/Insurance Copy, [employer] [phone].  Which is for informational purposes to pts lived experience.  CSW is asking for [facility] CSW/Christina to contact Heather for further information.  Herbert Seta will be reaching out to [employer] DSS for prospective guardianship.  Pt lives alone.  Pt was previously living with friends,unfortunately they can no longer care for pt therefore living her to live independently. ____________________________________  From: Virl Son [email or web address]> Sent: Thursday, June 10, 2021 8:56 PM To: Tarpley-Carter, Brightyn [email or web address]> Subject: Confidential: Debra Watkins   *Caution - External email - see footer for warnings*  Hi Ms. Gennie Alma,  Please see attached pictures of the current condition of Debra Watkins's home. This apartment is located at [address] [place] [address]. Her brother has a key to the apartment. The cats that were left there for months have been removed. It's my understanding that 3 went to the humane society and one of the cats (Debra Watkins favorite) was taken by a family friend. Debra Watkins has no knowledge of the cats. It would upset her greatly at this current time.  I have reached out to the on-call social worker with [facility] to see if they can go ahead and send over to [facility] and have notified the on-call social worker with [facility] that Debra Watkins is currently in the ED.  Debra Watkins mother is 64yo and currently living in assisted living and her brother has stage 4 colon cancer and is currently in treatment. Both are unable to assist with her care. Debra Watkins has no children or no other siblings or family members that can live in home to assist her with ADL's or any family members or friends who will be willing to take on that responsibility.  Debra Watkins was employed  and her employer was paying her premiums for her health plan. She was supposed to submit FMLA/STD paperwork back in August but failed to do so and stopped returning her employers phone calls. At this time, It is unknown if she is still covered under her employer sponsored plan. It's my understanding after speaking with someone from her employer today, that she is no longer employed. I asked for documentation. I have not received that as of this time. I do not know if Debra Watkins is aware of this at all either.  Debra Watkins was staying with her friend Debra Watkins and her husband at [address] [place], [address]. Her phone number is [phone]. My understanding is that Debra Watkins arrived to the home of Debra Watkins back in August, and asked to stay a few days because she was having issues with her "spinal stenosis." A few days turned into months and according to Mr. and Mrs. Debra Watkins, Debra Watkins health and mobility deteriorated  significantly. She went from walking, to dragging her foot, needing an assisted device to move around, couldn't sit to stand without assistance, needed help getting on and off the toilet, soiling herself and had repeated falls. They asked her numerous times to leave because they could no longer assist her and she refused to leave their home. They called EMS numerous times and she refused transport for treatment. They gave her a deadline to leave their home of 06/11/21. You can speak with them directly in regards to their experience.  A friend of Ms. Debra Watkins out of concern went to visit her and confirmed the decline since her past visit with Ms. Debra Watkins. You can contact her as well  to get more information. Her name is Debra Watkins. Her phone number is [phone].  Please let me know if you have any questions. Thank you so much for your help.  [employer] Son [employer] Copy, [employer]  Office: [phone]  Cell: [phone]   [email or web address]  [email or web address] ________________________________________ Jearld Fenton, MSW, LCSW-A Pronouns:  She/Her/Hers [facility] Clinical Social Worker Direct Number:  [phone] [email or web address]

## 2021-06-10 NOTE — ED Triage Notes (Signed)
Patient BIB [facility] for consistent back pain for 2 months. EMS reports that patient has been to Spine specialist MD with report of spinal stenosis. Patient states she has not been able to walk in 2 months. Patient stated she is tired of hurting and wants something to help.

## 2021-06-10 NOTE — Patient Instructions (Signed)
Visit Information   Goals Addressed             This Visit's Progress    Mobility and Independence Optimized       Timeframe:  Short-Term Goal Priority:  High Start Date:  11.9.22                            Patient Goals/Self-Care Activities: patient will:   - Engage with home health to complete PT eval once order accepted        The patient verbalized understanding of instructions, educational materials, and care plan provided today and declined offer to receive copy of patient instructions, educational materials, and care plan.   The care management team will reach out to the patient again over the next 7 days.   Bevelyn Ngo, BSW, CDP Social Worker, Certified Dementia Practitioner [facility] Care Management [phone]

## 2021-06-11 ENCOUNTER — Ambulatory Visit: Payer: Self-pay

## 2021-06-11 ENCOUNTER — Telehealth: Payer: Self-pay | Admitting: Internal Medicine

## 2021-06-11 DIAGNOSIS — E1169 Type 2 diabetes mellitus with other specified complication: Secondary | ICD-10-CM

## 2021-06-11 DIAGNOSIS — G8929 Other chronic pain: Secondary | ICD-10-CM

## 2021-06-11 DIAGNOSIS — I10 Essential (primary) hypertension: Secondary | ICD-10-CM

## 2021-06-11 DIAGNOSIS — Z794 Long term (current) use of insulin: Secondary | ICD-10-CM

## 2021-06-11 MED ORDER — ALBUTEROL SULFATE HFA 108 (90 BASE) MCG/ACT IN AERS: 2.0000 | INHALATION_SPRAY | Freq: Four times a day (QID) | RESPIRATORY_TRACT | Status: DC | PRN

## 2021-06-11 MED ORDER — GABAPENTIN 300 MG PO CAPS
300.0000 mg | ORAL_CAPSULE | Freq: Two times a day (BID) | ORAL | Status: DC
  Administered 2021-06-11 – 2021-06-15 (×11): 300 mg via ORAL
  Filled 2021-06-11 (×11): qty 1

## 2021-06-11 MED ORDER — METOPROLOL TARTRATE 25 MG PO TABS: 25.0000 mg | ORAL_TABLET | Freq: Two times a day (BID) | ORAL | Status: DC

## 2021-06-11 MED ORDER — METOPROLOL TARTRATE 25 MG PO TABS
25.0000 mg | ORAL_TABLET | Freq: Two times a day (BID) | ORAL | Status: DC
  Administered 2021-06-11 – 2021-06-15 (×10): 25 mg via ORAL
  Filled 2021-06-11 (×11): qty 1

## 2021-06-11 MED ORDER — LEVOTHYROXINE SODIUM 50 MCG PO TABS
175.0000 ug | ORAL_TABLET | Freq: Every day | ORAL | Status: DC
  Administered 2021-06-11 – 2021-06-15 (×5): 175 ug via ORAL
  Filled 2021-06-11 (×5): qty 1

## 2021-06-11 MED ORDER — HYDROCODONE-ACETAMINOPHEN 5-325 MG PO TABS: 1.0000 | ORAL_TABLET | Freq: Once | ORAL | Status: DC | PRN

## 2021-06-11 MED ORDER — POTASSIUM CHLORIDE CRYS ER 20 MEQ PO TBCR
40.0000 meq | EXTENDED_RELEASE_TABLET | Freq: Once | ORAL | Status: AC
  Administered 2021-06-11: 40 meq via ORAL
  Filled 2021-06-11: qty 2

## 2021-06-11 MED ORDER — LEVOTHYROXINE SODIUM 50 MCG PO TABS: 175.0000 ug | ORAL_TABLET | Freq: Every day | ORAL | Status: DC

## 2021-06-11 NOTE — Telephone Encounter (Addendum)
Patient calling regarding Disability paperwork that should have been faxed from [employer] ° °If we haven't received it, please call [phone] (main work line) or ask for Corlis in HR AT [phone] ° °Please follow-up with the patient  °

## 2021-06-11 NOTE — Chronic Care Management (AMB) (Signed)
Social Work Note  06/11/2021 Name: Debra Watkins MRN: [id number] DOB: Dec 11, 1955  Debra Watkins is a 65 y.o. year old female who is a primary care patient of Burns, Bobette Mo, MD.  The Care Management team was consulted for assistance with chronic disease management and care coordination needs.  Debra Watkins was given information about Care Management services today including:  Care Management services include personalized support from designated clinical staff supervised by her physician, including individualized plan of care and coordination with other care providers 24/7 contact phone numbers for assistance for urgent and routine care needs. The patient may stop care management services at any time (effective at the end of the month) by phone call to the office staff.  Patient agreed to services and consent obtained.   Collaboration with Dr. Lawerance Bach  for  case discussion  in response to provider referral for social work chronic care management and care coordination services.  Assessment: Review of patient past medical history, allergies, medications, and health status, including review of pertinent consultant reports was performed as part of comprehensive evaluation and provision of care management/care coordination services.   SDOH (Social Determinants of Health) assessments and interventions performed:  No    Advanced Directives Status: Not addressed in this encounter.  Care Plan  Allergies  Allergen Reactions   Penicillins     Did it involve swelling of the face/tongue/throat, SOB, or low BP? No Did it involve sudden or severe rash/hives, skin peeling, or any reaction on the inside of your mouth or nose? Yes Did you need to seek medical attention at a hospital or doctor's office? No When did it last happen?    Several Years Ago   If all above answers are "NO", may proceed with cephalosporin use.     Tetanus Toxoid     REACTION: hives    Facility-Administered  Encounter Medications as of 06/11/2021  Medication   albuterol (VENTOLIN HFA) 108 (90 Base) MCG/ACT inhaler 2 puff   gabapentin (NEURONTIN) capsule 300 mg   HYDROcodone-acetaminophen (NORCO/VICODIN) 5-325 MG per tablet 1 tablet   levothyroxine (SYNTHROID) tablet 175 mcg   lidocaine (LIDODERM) 5 % 1 patch   metoprolol tartrate (LOPRESSOR) tablet 25 mg   Outpatient Encounter Medications as of 06/11/2021  Medication Sig Note   albuterol (VENTOLIN HFA) 108 (90 Base) MCG/ACT inhaler INHALE 2 PUFFS INTO THE LUNGS EVERY 6 HOURS AS NEEDED    gabapentin (NEURONTIN) 300 MG capsule Take 300 mg by mouth 2 (two) times daily. 06/10/2021: Need Refills   HYDROcodone-acetaminophen (NORCO/VICODIN) 5-325 MG tablet Take 1 tablet by mouth every 4 (four) hours as needed.    ibuprofen (ADVIL) 600 MG tablet Take 1 tablet (600 mg total) by mouth every 6 (six) hours as needed.    levothyroxine (SYNTHROID) 175 MCG tablet TAKE 1 TABLET(175 MCG) BY MOUTH DAILY BEFORE BREAKFAST    lidocaine (LIDODERM) 5 % Place 1 patch onto the skin daily as needed. Remove & Discard patch within 12 hours or as directed by MD    losartan (COZAAR) 25 MG tablet TAKE 1 TABLET(25 MG) BY MOUTH DAILY (Patient not taking: No sig reported)    methocarbamol (ROBAXIN) 500 MG tablet Take 2 tablets (1,000 mg total) by mouth 2 (two) times daily for 5 days.    metoprolol tartrate (LOPRESSOR) 25 MG tablet TAKE 1 TABLET(25 MG) BY MOUTH TWICE DAILY    oxyCODONE-acetaminophen (PERCOCET/ROXICET) 5-325 MG tablet Take 1 tablet by mouth 3 (three) times daily as needed for  moderate pain.     Patient Active Problem List   Diagnosis Date Noted   Requires assistance with activities of daily living (ADL) 06/06/2021   Diabetes (HCC) 07/02/2016   Essential hypertension, benign 06/29/2016   Chronic lower back pain 06/29/2016   Mild reactive airways disease 01/16/2015   DJD (degenerative joint disease) 05/07/2012   Osteopenia 05/07/2012   Vitamin D deficiency  02/21/2009   Hypothyroidism following radioiodine therapy 03/10/2008   Morbid obesity (HCC) 12/13/2007   Anxiety state 12/13/2007    Conditions to be addressed/monitored:  HTN, Hypothyroidism, DM, Chronic Lower Back Pain ; Level of care concerns and Limited access to caregiver  Care Plan : Social Work Care Plan  Updates made by Bevelyn Ngo since 06/11/2021 12:00 AM     Problem: Mobility and Independence      Goal: Mobility and Independence Optimized   Start Date: 06/09/2021  Priority: High  Note:   Current Barriers:  Chronic disease management support and education needs related to  HTN, DM, Chronic Lower Back Pain, Hypothyroidism   Level of care concerns Unable to stay in friends home after Friday - limited support system  Social Worker Clinical Goal(s):  patient will work with SW to identify and address any acute and/or chronic care coordination needs related to the self health management of  HTN, Hypothyroidism, DM, Chronic Lower Back Pain   patient will work with SW to address concerns related to placement  SW Interventions:  Inter-disciplinary care team collaboration (see longitudinal plan of care) Collaboration with Lawerance Bach, Bobette Mo, MD regarding development and update of comprehensive plan of care as evidenced by provider attestation and co-signature Communication received from Dr. Lawerance Bach indicating she would order desired equipments as well as home health PT, OT, RN, and aid for patients anticipated return home due to patients report she can no longer stay with her friends  Performed chart review to note patient actively in the ED Collaboration with ED CSW to advise patient is active with this SW in the community - requested ED CSW update SW as needed on discharge planning Collaboration with Dr. Lawerance Bach to advise patient is actively in the ED and it is noted patient has an active APS referral open which this SW was unaware of Patient Goals/Self-Care Activities patient will:    -  Engage with home health to complete PT eval once order accepted  Follow Up Plan:  SW will continue to follow       Follow Up Plan:  SW will continue to follow      Bevelyn Ngo, BSW, CDP Social Worker, Certified Dementia Practitioner [facility] [place] Care Management [phone]

## 2021-06-11 NOTE — ED Notes (Signed)
Pt cleaned and purewick and depend applied.

## 2021-06-11 NOTE — ED Notes (Signed)
Patient reports no complaints at this time.  Offered hygiene care and patient states she is "ok" at this time.  Encourage to let staff know if she needs anything.  Understanding voiced

## 2021-06-11 NOTE — Progress Notes (Addendum)
CSW spoke with APS after-hours SW [facility] [phone]) who ignited case. She inquired about recommendations from MD. CSW informed APS worker pt is cleared for d/c and is currently boarding. CSW informed SW of pt barriers to placement, pt does not have any income or LTC payer source.    APS case will be assigned on Monday to [facility] [phone]), her supervisor is Paulita Fujita [phone]).   CSW spoke with pt and provided an update, pt will board in the ED over the weekend due to no safe d/c at this time. TOC continues to follow.   Valentina Shaggy.Hien Perreira, MSW, LCSWA [facility]  Transitions of Care Clinical Social Worker I Direct Dial: [phone]  Fax: [phone] [email or web address]

## 2021-06-11 NOTE — Progress Notes (Signed)
CSW attempted to contact [facility] APS in regards to open APS investigation, however social services is closed due to it being a holiday. CSW continue to follow.   Valentina Shaggy.Keylin Ferryman, MSW, LCSWA [facility]  Transitions of Care Clinical Social Worker I Direct Dial: [phone]  Fax: [phone] [email or web address]

## 2021-06-11 NOTE — ED Notes (Signed)
Changed chucks, purewick, gown

## 2021-06-11 NOTE — Telephone Encounter (Incomplete Revision)
Patient calling regarding Disability paperwork that should have been faxed from [employer]  If we haven't received it, please call [phone] (main work line) or ask for Corlis in HR AT [phone]  Please follow-up with the patient

## 2021-06-12 LAB — URINALYSIS, ROUTINE W REFLEX MICROSCOPIC
Bilirubin Urine: NEGATIVE
Glucose, UA: NEGATIVE mg/dL
Ketones, ur: NEGATIVE mg/dL
Nitrite: NEGATIVE
Protein, ur: NEGATIVE mg/dL
Specific Gravity, Urine: 1.011 (ref 1.005–1.030)
pH: 5 (ref 5.0–8.0)

## 2021-06-12 NOTE — ED Notes (Signed)
Lunch provided.

## 2021-06-12 NOTE — ED Provider Notes (Signed)
Emergency Medicine Observation Re-evaluation Note  Debra Watkins is a 65 y.o. female, seen on rounds today.  Pt initially presented to the ED for complaints of Back Pain Currently, the patient is sitting in bed eating lunch with no specific complaints.  Physical Exam  BP (!) 114/96   Pulse 60   Temp 97.8 F (36.6 C) (Oral)   Resp 16   Ht 5\' 4"  (1.626 m)   Wt 104.3 kg   SpO2 96%   BMI 39.48 kg/m  Physical Exam General: No acute distress Cardiac: Regular rate Lungs: Clear to auscultation Does not seem to be having any pain currently.  However she is sitting in bed.  ED Course / MDM  EKG:EKG Interpretation  Date/Time:  Thursday June 10 2021 21:04:06 EST Ventricular Rate:  76 PR Interval:  168 QRS Duration: 109 QT Interval:  398 QTC Calculation: 448 R Axis:   21 Text Interpretation: Sinus rhythm Anterior infarct, old No old tracing to compare Confirmed by 11-17-1989 (Dione Booze) on 06/11/2021 2:25:24 AM  I have reviewed the labs performed to date as well as medications administered while in observation.  Recent changes in the last 24 hours include none.  Plan  Current plan is for rehab placement.  Patient will be in the department over the weekend as social work is attempting placement.  She lives alone and due to her spinal stenosis has not been able to care for herself independently.  13/06/2021 is not under involuntary commitment.     Araceli Bouche, MD 06/12/21 1415

## 2021-06-12 NOTE — ED Notes (Signed)
Patient repositioned in bed.  New external catheter placed.  Peri care preformed.  Patient has no additional complaints at this time

## 2021-06-12 NOTE — Evaluation (Signed)
Physical Therapy Evaluation Patient Details Name: Debra Watkins MRN: [id number] DOB: [birth year] Today's Date: 06/12/2021  History of Present Illness  65 y.o. female with history of spinal stenosis and anxiety who presents to the ED with complaint of chronic back pain.  Clinical Impression  Pt seen in ED.  Pt currently with functional limitations due to the deficits listed below (see PT Problem List). Pt will benefit from skilled PT to increase their independence and safety with mobility to allow discharge to the venue listed below.  Pt reports she has declined in mobility and strength over the past 2 months.  Pt states she started to require assist for transfers and was using w/c while staying with friends however they are unable to assist her any longer.  Pt reports her home apt is full of items due to also attempting to transition her mother from ILF to ALF (prior to her onset of back pain requiring her to stay with friends) and she doesn't believe assistive device will fit in her home.  Pt also lives alone. Pt requiring at least mod assist for attempting to stand today and presents as high fall risk.  Pt would benefit from d/c to SNF for rehab.         Recommendations for follow up therapy are one component of a multi-disciplinary discharge planning process, led by the attending physician.  Recommendations may be updated based on patient status, additional functional criteria and insurance authorization.  Follow Up Recommendations Skilled nursing-short term rehab (<3 hours/day)    Assistance Recommended at Discharge Intermittent Supervision/Assistance  Functional Status Assessment Patient has had a recent decline in their functional status and demonstrates the ability to make significant improvements in function in a reasonable and predictable amount of time.  Equipment Recommendations  None recommended by PT    Recommendations for Other Services       Precautions / Restrictions  Precautions Precautions: Fall      Mobility  Bed Mobility Overal bed mobility: Needs Assistance Bed Mobility: Supine to Sit;Sit to Supine     Supine to sit: Supervision;HOB elevated Sit to supine: Min assist;HOB elevated   General bed mobility comments: assist for LEs    Transfers Overall transfer level: Needs assistance Equipment used: Rolling walker (2 wheels) Transfers: Sit to/from Stand Sit to Stand: Mod assist;Max assist;From elevated surface           General transfer comment: pt attempted x4 however unable to stand completely erect; cues for hand placement and weight shifting; required blocking pts feet due to slipping forward as well (despite change from her home slippers to grip socks)    Ambulation/Gait               General Gait Details: deferred for safety  Stairs            Wheelchair Mobility    Modified Rankin (Stroke Patients Only)       Balance Overall balance assessment: Needs assistance;History of Falls         Standing balance support: Bilateral upper extremity supported;Reliant on assistive device for balance Standing balance-Leahy Scale: Poor                               Pertinent Vitals/Pain Pain Assessment: Faces Faces Pain Scale: Hurts little more Pain Location: back and Rt leg Pain Descriptors / Indicators: Shooting Pain Intervention(s): Monitored during session    Home Living Family/patient expects to  be discharged to:: Private residence Living Arrangements: Alone   Type of Home: Apartment Home Access: Stairs to enter   [employer] of Steps: 1 "curb"   Home Layout: One level Home Equipment: Rollator (4 wheels)      Prior Function Prior Level of Function : Needs assist       Physical Assist : Mobility (physical) Mobility (physical): Transfers;Gait   Mobility Comments: pt reports she has been staying with friends the past couple months and has been w/c bound due to back pain  from spinal stenosis which has worsened; she has been requiring assist for transfers as well; previous to 2 months ago pt reports being ambulatory; pt can no longer stay with her friends and her apt is cluttered with her own things plus her mother's (was transitioning her from ILF to ALF prior to onset of increased pain and decreased mobility)       Hand Dominance        Extremity/Trunk Assessment        Lower Extremity Assessment Lower Extremity Assessment: Generalized weakness;RLE deficits/detail RLE Deficits / Details: appears functionally weaker then left, pt reports rt sided symptoms from hx of spinal stenosis, decreased DF       Communication   Communication: No difficulties  Cognition Arousal/Alertness: Awake/alert Behavior During Therapy: WFL for tasks assessed/performed;Flat affect Overall Cognitive Status: Within Functional Limits for tasks assessed                                          General Comments      Exercises     Assessment/Plan    PT Assessment Patient needs continued PT services  PT Problem List Decreased strength;Decreased mobility;Decreased activity tolerance;Decreased balance;Decreased knowledge of use of DME;Pain       PT Treatment Interventions DME instruction;Gait training;Therapeutic exercise;Therapeutic activities;Functional mobility training;Balance training;Patient/family education;Wheelchair mobility training    PT Goals (Current goals can be found in the Care Plan section)  Acute Rehab PT Goals PT Goal Formulation: With patient Time For Goal Achievement: 06/26/21 Potential to Achieve Goals: Good    Frequency Min 2X/week   Barriers to discharge        Co-evaluation               AM-PAC PT "6 Clicks" Mobility  Outcome Measure Help needed turning from your back to your side while in a flat bed without using bedrails?: A Little Help needed moving from lying on your back to sitting on the side of a flat  bed without using bedrails?: A Little Help needed moving to and from a bed to a chair (including a wheelchair)?: A Lot Help needed standing up from a chair using your arms (e.g., wheelchair or bedside chair)?: A Lot Help needed to walk in hospital room?: Total Help needed climbing 3-5 steps with a railing? : Total 6 Click Score: 12    End of Session Equipment Utilized During Treatment: Gait belt Activity Tolerance: Patient limited by pain;Patient limited by fatigue Patient left: with call bell/phone within reach;in bed   PT Visit Diagnosis: Other abnormalities of gait and mobility (R26.89);Repeated falls (R29.6)    Time: 1430-1455 PT Time Calculation (min) (ACUTE ONLY): 25 min   Charges:   PT Evaluation $PT Eval Low Complexity: 1 Low     Kati PT, DPT Acute Rehabilitation Services Pager: [phone] Office: [phone]   Janan Halter [place] 06/12/2021,  3:47 PM

## 2021-06-12 NOTE — ED Notes (Signed)
Pt transferred from ED stretcher to hospital bed. Bed linens changed, brief checked, and pt repositioned. Pt given her dinner meal tray. All questions/concerns addressed at this time.

## 2021-06-12 NOTE — ED Notes (Signed)
Breakfast tray provided. 

## 2021-06-13 NOTE — ED Notes (Signed)
Breakfast provided.

## 2021-06-13 NOTE — ED Provider Notes (Signed)
Emergency Medicine Observation Re-evaluation Note  Debra Watkins is a 65 y.o. female, seen on rounds today.  Pt initially presented to the ED for complaints of Back Pain Currently, the patient is sleeping.  Physical Exam  BP (!) 113/59   Pulse 64   Temp 97.8 F (36.6 C) (Oral)   Resp 15   Ht 5\' 4"  (1.626 m)   Wt 104.3 kg   SpO2 91%   BMI 39.48 kg/m  Physical Exam General: sleeping Cardiac: regular rate Lungs: clear Lower back pain  ED Course / MDM  EKG:EKG Interpretation  Date/Time:  Thursday June 10 2021 21:04:06 EST Ventricular Rate:  76 PR Interval:  168 QRS Duration: 109 QT Interval:  398 QTC Calculation: 448 R Axis:   21 Text Interpretation: Sinus rhythm Anterior infarct, old No old tracing to compare Confirmed by 11-17-1989 (Dione Booze) on 06/11/2021 2:25:24 AM  I have reviewed the labs performed to date as well as medications administered while in observation.  Recent changes in the last 24 hours include none.  Plan  Current plan is for rehab placement.  Pt eval ordered      13/06/2021, MD 06/13/21 [phone]

## 2021-06-14 NOTE — NC FL2 (Signed)
[place] MEDICAID FL2 LEVEL OF CARE SCREENING TOOL     IDENTIFICATION  Patient Name: Debra Watkins Birthdate: [birth year] Sex: female Admission Date (Current Location): 06/10/2021  [facility] and [place] Number:  Producer, television/film/video and Address:  [facility],  [address] [place], [place] [address]      Provider Number: [id number]  Attending Physician Name and Address:  Default, Provider, MD  Relative Name and Phone Number:       Current Level of Care: Hospital Recommended Level of Care: Skilled Nursing Facility Prior Approval Number:    Date Approved/Denied:   PASRR Number: [id number]  Discharge Plan: SNF    Current Diagnoses: Patient Active Problem List   Diagnosis Date Noted   Requires assistance with activities of daily living (ADL) 06/06/2021   Diabetes (HCC) 07/02/2016   Essential hypertension, benign 06/29/2016   Chronic lower back pain 06/29/2016   Mild reactive airways disease 01/16/2015   DJD (degenerative joint disease) 05/07/2012   Osteopenia 05/07/2012   Vitamin D deficiency 02/21/2009   Hypothyroidism following radioiodine therapy 03/10/2008   Morbid obesity (HCC) 12/13/2007   Anxiety state 12/13/2007    Orientation RESPIRATION BLADDER Height & Weight     Self, Time, Situation, Place  Normal Continent, External catheter Weight: 230 lb (104.3 kg) Height:  5\' 4"  (162.6 cm)  BEHAVIORAL SYMPTOMS/MOOD NEUROLOGICAL BOWEL NUTRITION STATUS      Continent Diet (regular)  AMBULATORY STATUS COMMUNICATION OF NEEDS Skin   Limited Assist Verbally Normal                       Personal Care Assistance Level of Assistance  Bathing, Feeding, Dressing Bathing Assistance: Limited assistance Feeding assistance: Independent Dressing Assistance: Independent     Functional Limitations Info  Sight, Hearing, Speech Sight Info: Adequate Hearing Info: Adequate Speech Info: Adequate    SPECIAL CARE FACTORS FREQUENCY  PT (By licensed PT), OT  (By licensed OT)     PT Frequency: 5x a week OT Frequency: 5x a week            Contractures Contractures Info: Not present    Additional Factors Info  Code Status, Allergies Code Status Info: full Allergies Info: Penicillins,Tetanus Toxoid           Current Medications (06/14/2021):  This is the current hospital active medication list Current Facility-Administered Medications  Medication Dose Route Frequency Provider Last Rate Last Admin   albuterol (VENTOLIN HFA) 108 (90 Base) MCG/ACT inhaler 2 puff  2 puff Inhalation Q6H PRN 06/16/2021 A, DO       gabapentin (NEURONTIN) capsule 300 mg  300 mg Oral BID Tanda Rockers A, DO   300 mg at 06/14/21 06/16/21   HYDROcodone-acetaminophen (NORCO/VICODIN) 5-325 MG per tablet 1 tablet  1 tablet Oral Once PRN 5277 A, DO       levothyroxine (SYNTHROID) tablet 175 mcg  175 mcg Oral Q0600 Tanda Rockers A, DO   175 mcg at 06/14/21 0634   lidocaine (LIDODERM) 5 % 1 patch  1 patch Transdermal Q24H 06/16/21 A, DO   1 patch at 06/13/21 2058   metoprolol tartrate (LOPRESSOR) tablet 25 mg  25 mg Oral BID 2059 A, DO   25 mg at 06/14/21 0908   Current Outpatient Medications  Medication Sig Dispense Refill   albuterol (VENTOLIN HFA) 108 (90 Base) MCG/ACT inhaler INHALE 2 PUFFS INTO THE LUNGS EVERY 6 HOURS AS NEEDED 20.1 g 5   gabapentin (  NEURONTIN) 300 MG capsule Take 300 mg by mouth 2 (two) times daily.     HYDROcodone-acetaminophen (NORCO/VICODIN) 5-325 MG tablet Take 1 tablet by mouth every 4 (four) hours as needed. 10 tablet 0   ibuprofen (ADVIL) 600 MG tablet Take 1 tablet (600 mg total) by mouth every 6 (six) hours as needed. 30 tablet 0   lidocaine (LIDODERM) 5 % Place 1 patch onto the skin daily as needed. Remove & Discard patch within 12 hours or as directed by MD 15 patch 0   methocarbamol (ROBAXIN) 500 MG tablet Take 2 tablets (1,000 mg total) by mouth 2 (two) times daily for 5 days. 20 tablet 0   metoprolol tartrate  (LOPRESSOR) 25 MG tablet TAKE 1 TABLET(25 MG) BY MOUTH TWICE DAILY 60 tablet 0   oxyCODONE-acetaminophen (PERCOCET/ROXICET) 5-325 MG tablet Take 1 tablet by mouth 3 (three) times daily as needed for moderate pain.     levothyroxine (SYNTHROID) 175 MCG tablet TAKE 1 TABLET(175 MCG) BY MOUTH DAILY BEFORE BREAKFAST 90 tablet 0   losartan (COZAAR) 25 MG tablet TAKE 1 TABLET(25 MG) BY MOUTH DAILY (Patient not taking: No sig reported) 90 tablet 0     Discharge Medications: Please see discharge summary for a list of discharge medications.  Relevant Imaging Results:  Relevant Lab Results:   Additional Information SSN [id number]  COVID X 3  Valentina Shaggy Wilder Kurowski, LCSW

## 2021-06-14 NOTE — Progress Notes (Signed)
TOC CSW spoke with pt's APS worker Elmarie Mainland, she inquired about pt's mental capacity, and if a psych eval was completed on pt. She stated she will be speaking with pt today to gather more information.   CSW spoke with registration to verify pt's insurance. According to notes in pt chart she is uninsured, registration verified pt's insurance is active.   CSW spoke with pt about recommendation for rehab, pt agreed. CSW to work pt up for SNF.    12:52pm  CSW spoke with pt's APS worker Elmarie Mainland , who stated pt reported being employed , but on short term disability. APS worker stated pt's reported she is wanting to go to a nice clean facility. CSW informed APS worker if pt is unable to get in facility she will be d/c home. APS worker stated pt is to get in contact with insurance company and pt's mother to discuss funding. CSW continue to follow.  Valentina Shaggy.Dazia Lippold, MSW, LCSWA [facility]  Transitions of Care Clinical Social Worker I Direct Dial: [phone]  Fax: [phone] [email or web address]

## 2021-06-15 LAB — RESP PANEL BY RT-PCR (FLU A&B, COVID) ARPGX2
Influenza A by PCR: NEGATIVE
Influenza B by PCR: NEGATIVE
SARS Coronavirus 2 by RT PCR: NEGATIVE

## 2021-06-15 NOTE — ED Notes (Signed)
Pt care taken, pt is resting, no complaints at this time. 

## 2021-06-15 NOTE — Progress Notes (Signed)
CSW received message from [facility], pt Debra Watkins was approved. She is requesting COVID test to be completed. CSW has made MD and RN aware.   Valentina Shaggy.Shloma Roggenkamp, MSW, LCSWA [facility]  Transitions of Care Clinical Social Worker I Direct Dial: [phone]  Fax: [phone] [email or web address]

## 2021-06-15 NOTE — Progress Notes (Signed)
CSW provided updated to pt's APS Camillie Smith.  Valentina Shaggy.Emerita Berkemeier, MSW, LCSWA [facility]  Transitions of Care Clinical Social Worker I Direct Dial: [phone]  Fax: [phone] [email or web address]

## 2021-06-15 NOTE — ED Notes (Signed)
PTAR called  

## 2021-06-15 NOTE — ED Notes (Signed)
Report called to [facility]. 

## 2021-06-15 NOTE — ED Provider Notes (Signed)
Emergency Medicine Observation Re-evaluation Note  Debra Watkins is a 65 y.o. female, seen on rounds today.  Pt initially presented to the ED for complaints of Back Pain Currently, the patient is eating breakfast and resting comfortably.  Physical Exam  BP 130/71 (BP Location: Left Arm)   Pulse 66   Temp 98.2 F (36.8 C) (Oral)   Resp 14   Ht 5\' 4"  (1.626 m)   Wt 104.3 kg   SpO2 96%   BMI 39.48 kg/m  Physical Exam General: No acute distress Cardiac: Regular rate Lungs: Lungs are clear and no respiratory distress Psych: Calm, cooperative  ED Course / MDM  EKG:EKG Interpretation  Date/Time:  Thursday June 10 2021 21:04:06 EST Ventricular Rate:  76 PR Interval:  168 QRS Duration: 109 QT Interval:  398 QTC Calculation: 448 R Axis:   21 Text Interpretation: Sinus rhythm Anterior infarct, old No old tracing to compare Confirmed by 11-17-1989 (Dione Booze) on 06/11/2021 2:25:24 AM  I have reviewed the labs performed to date as well as medications administered while in observation.  Recent changes in the last 24 hours include : No new changes.  Plan  Current plan is for patient to be assessed by social work team.  Pending more recommendation.  13/06/2021 is not under involuntary commitment.     Araceli Bouche, MD 06/15/21 1010

## 2021-06-15 NOTE — Progress Notes (Signed)
TOC CSW spoke with pt about bed offers, pt chose [facility]. CSW spoke with Olegario Messier who stated she will be starting Auth .  Valentina Shaggy.Aishi Courts, MSW, LCSWA [facility]  Transitions of Care Clinical Social Worker I Direct Dial: [phone]  Fax: [phone] [email or web address]

## 2021-06-15 NOTE — Progress Notes (Signed)
CSW spoke with Kell from [facility] pt's room # 101 call reported [phone] .  Valentina Shaggy.Zaide Kardell, MSW, LCSWA [facility]  Transitions of Care Clinical Social Worker I Direct Dial: [phone]  Fax: [phone] [email or web address]

## 2021-06-15 NOTE — Progress Notes (Signed)
Physical Therapy Treatment Patient Details Name: Debra Watkins MRN: [id number] DOB: Oct 26, 1955 Today's Date: 06/15/2021   History of Present Illness 65 y.o. female with history of spinal stenosis and anxiety who presents to the ED with complaint of chronic back pain.    PT Comments    Pt seen in ED room 23.  Pt in bed, in dark, awake watching TV.  General Comments: AxO x 3 very pleasant lady slightly slow to speak and respond.  Says she workf for [employer] up until 2 months ago "been staying with friends" and "using a wheelchair mostly".  She says she has a CONDO and was driving to work prior to 2 months ago.  Pt has 2 cats that her brotyher is looking after but he has Stage 4 Colon CA and getting ready to have surgery.  Pt has no children. Assisted OOB to attempt amb was difficult.  General bed mobility comments: required increased time x 2 and use of rail.  Very slow moving.  "I haven't been out of this bed in 4 days".  Also present with back pain with movement. General transfer comment: placed a straight back chair next to bed and instructed pt to get self OOB to chair.  Pt required increased time x 2 and use of hands to steady self.  Pt also using forward momentum and only partially stood upright but was able to complete 1/4 turn.  Then assisted pt with sit to stand onto EVA walker.  Again, very slow to move and slow to transition hands.  Remained forward flex throughout.General Gait Details: used B Platform EVA walker due to decreased mobility ability and pt stating she has been using a wheelchair for 2 months.  Pt partially stood with excessive lean on EVA walker.  Pt was able to take a few steps forward approx 2 feet and a few steps backward with straight back chair closely following behind as pt reports her R knee "gives way". Assisted back to bed.  Pt will need ST Rehab at SNF prior to safely returning home.   Recommendations for follow up therapy are one component of a  multi-disciplinary discharge planning process, led by the attending physician.  Recommendations may be updated based on patient status, additional functional criteria and insurance authorization.  Follow Up Recommendations  Skilled nursing-short term rehab (<3 hours/day)     Assistance Recommended at Discharge    Equipment Recommendations  None recommended by PT    Recommendations for Other Services       Precautions / Restrictions Precautions Precautions: Fall Precaution Comments: Spinal Steosis/chronic back pain     Mobility  Bed Mobility Overal bed mobility: Needs Assistance Bed Mobility: Supine to Sit;Sit to Supine     Supine to sit: Min guard;Min assist Sit to supine: Min assist;Mod assist   General bed mobility comments: required increased time x 2 and use of rail.  Very slow moving.  "I haven't been out of this bed in 4 days".  Also present with back pain with movement.    Transfers Overall transfer level: Needs assistance Equipment used: None Transfers: Bed to chair/wheelchair/BSC Sit to Stand: Min assist           General transfer comment: placed a straight back chair next to bed and instructed pt to get self OOB to chair.  Pt required increased time x 2 and use of hands to steady self.  Pt also using forward momentum and only partially stood upright but was  able to complete 1/4 turn.  Then assisted pt with sit to stand onto EVA walker.  Again, very slow to move and slow to transition hands.  Remained forward flex throughout.    Ambulation/Gait Ambulation/Gait assistance: Mod assist Gait Distance (Feet): 2 Feet Assistive device: Bilateral platform walker Gait Pattern/deviations: Step-to pattern;Decreased stance time - right;Decreased step length - right;Decreased step length - left;Trunk flexed Gait velocity: decreased     General Gait Details: used B Platform EVA walker due to decreased mobility ability and pt stating she has been using a wheelchair for 2  months.  Pt partially stood with excessive lean on EVA walker.  Pt was able to take a few steps forward approx 2 feet and a few steps backward with straight back chair closely following behind as pt reports her R knee "gives way".   Stairs             Wheelchair Mobility    Modified Rankin (Stroke Patients Only)       Balance                                            Cognition Arousal/Alertness: Awake/alert Behavior During Therapy: WFL for tasks assessed/performed;Flat affect Overall Cognitive Status: Within Functional Limits for tasks assessed                                 General Comments: AxO x 3 very pleasant lady slightly slow to speak and respond.  Says she workf for [employer] up until 2 months ago "been staying with friends" and "using a wheelchair mostly".  She says she has a CONDO and was driving to work prior to 2 months ago.  Pt has 2 cats that her brotyher is looking after but he has Stage 4 Colon CA and getting ready to have surgery.  Pt has no children.        Exercises      General Comments        Pertinent Vitals/Pain Pain Assessment: Faces Faces Pain Scale: Hurts even more Pain Location: back and R hip Pain Descriptors / Indicators: Grimacing;Shooting Pain Intervention(s): Monitored during session;Repositioned    Home Living                          Prior Function            PT Goals (current goals can now be found in the care plan section)      Frequency    Min 2X/week      PT Plan Current plan remains appropriate    Co-evaluation              AM-PAC PT "6 Clicks" Mobility   Outcome Measure  Help needed turning from your back to your side while in a flat bed without using bedrails?: A Little Help needed moving from lying on your back to sitting on the side of a flat bed without using bedrails?: A Little Help needed moving to and from a bed to a chair (including a  wheelchair)?: A Lot Help needed standing up from a chair using your arms (e.g., wheelchair or bedside chair)?: A Lot Help needed to walk in hospital room?: A Lot Help needed climbing 3-5 steps with a railing? :  Total 6 Click Score: 13    End of Session Equipment Utilized During Treatment: Gait belt Activity Tolerance: Patient limited by pain;Patient limited by fatigue Patient left: with call bell/phone within reach;in bed;with bed alarm set   PT Visit Diagnosis: Other abnormalities of gait and mobility (R26.89);Repeated falls (R29.6)     Time: 3329-5188 PT Time Calculation (min) (ACUTE ONLY): 28 min  Charges:  $Gait Training: 8-22 mins $Therapeutic Activity: 8-22 mins                     Felecia Shelling  PTA Acute  Rehabilitation Services Pager      [phone] Office      [phone]

## 2021-06-15 NOTE — ED Notes (Signed)
Pt attached to cardiac monitor x3 by this  nurse. VSS. Pt A&O x4. Denies any complaints or concerns at this time.

## 2021-06-17 NOTE — Telephone Encounter (Signed)
This message is from [place] representative:   "SW note indicates that they have been in communication with APS and may be pursuing guardianship of Ms. Boullion due to her current living situation. From the SW note it sounds like she no longer has an appropriate /safe place to live.  Given her current circumstance I don't believe we could provide HH to her at this time knowing that her living environment is not safe, if this changes please let me know and I am happy to re-visit.  Thanks so much for reaching out,  Evangeline Nation [facility] "

## 2021-06-17 NOTE — Telephone Encounter (Signed)
Message left today for Ms. Corlis Harris to fax FMLA paperwork to my attention.

## 2021-06-18 ENCOUNTER — Telehealth: Payer: Self-pay

## 2021-06-18 NOTE — Telephone Encounter (Signed)
  Care Management   Follow Up Note   06/18/2021 Name: Debra Watkins MRN: [id number] DOB: March 01, 1956   Referred by: Pincus Sanes, MD Reason for referral : Care Coordination   SW placed an unsuccessful call to [name] with [facility] to discuss patient plan of care. SW left a HIPAA compliant voice message requesting a return call.  Follow Up Plan: SW will continue to follow.  Bevelyn Ngo, BSW, CDP Social Worker, Certified Dementia Practitioner [facility] Care Management [phone]

## 2021-06-22 ENCOUNTER — Ambulatory Visit: Payer: Self-pay

## 2021-06-22 ENCOUNTER — Telehealth: Payer: Self-pay

## 2021-06-22 DIAGNOSIS — I10 Essential (primary) hypertension: Secondary | ICD-10-CM

## 2021-06-22 DIAGNOSIS — E1169 Type 2 diabetes mellitus with other specified complication: Secondary | ICD-10-CM

## 2021-06-22 DIAGNOSIS — Z794 Long term (current) use of insulin: Secondary | ICD-10-CM

## 2021-06-22 NOTE — Telephone Encounter (Signed)
  Care Management   Follow Up Note   06/22/2021 Name: Debra Watkins MRN: [id number] DOB: January 29, 1956   Referred by: Pincus Sanes, MD Reason for referral : Chronic Care Management (Unsuccessful call)   SW placed a second unsuccessful call to [name] with [facility] to assess patients progress in rehab and discuss plans for discharge. SW left a voice message requesting a return call.  Follow Up Plan:  SW will follow up with [facility] over the next 10 days if a response is not received.  Bevelyn Ngo, BSW, CDP Social Worker, Certified Dementia Practitioner [facility] Care Management [phone]

## 2021-06-22 NOTE — Chronic Care Management (AMB) (Signed)
Social Work Note  06/22/2021 Name: Debra Watkins MRN: [id number] DOB: 1956-04-25  Debra Watkins is a 65 y.o. year old female who is a primary care patient of Burns, Bobette Mo, MD.  The Care Management team was consulted for assistance with chronic disease management and care coordination needs.  Ms. Perko was given information about Care Management services today including:  Care Management services include personalized support from designated clinical staff supervised by her physician, including individualized plan of care and coordination with other care providers 24/7 contact phone numbers for assistance for urgent and routine care needs. The patient may stop care management services at any time (effective at the end of the month) by phone call to the office staff.  Patient agreed to services and consent obtained.   Collaboration with [facility]  for  Case Discussion  in response to provider referral for social work chronic care management and care coordination services.  Assessment: Review of patient past medical history, allergies, medications, and health status, including review of pertinent consultant reports was performed as part of comprehensive evaluation and provision of care management/care coordination services.   SDOH (Social Determinants of Health) assessments and interventions performed:  No    Advanced Directives Status: Not addressed in this encounter.  Care Plan  Allergies  Allergen Reactions   Penicillins     Did it involve swelling of the face/tongue/throat, SOB, or low BP? No Did it involve sudden or severe rash/hives, skin peeling, or any reaction on the inside of your mouth or nose? Yes Did you need to seek medical attention at a hospital or doctor's office? No When did it last happen?    Several Years Ago   If all above answers are "NO", may proceed with cephalosporin use.     Tetanus Toxoid     REACTION: hives    Outpatient  Encounter Medications as of 06/22/2021  Medication Sig Note   albuterol (VENTOLIN HFA) 108 (90 Base) MCG/ACT inhaler INHALE 2 PUFFS INTO THE LUNGS EVERY 6 HOURS AS NEEDED    gabapentin (NEURONTIN) 300 MG capsule Take 300 mg by mouth 2 (two) times daily. 06/10/2021: Need Refills   HYDROcodone-acetaminophen (NORCO/VICODIN) 5-325 MG tablet Take 1 tablet by mouth every 4 (four) hours as needed.    ibuprofen (ADVIL) 600 MG tablet Take 1 tablet (600 mg total) by mouth every 6 (six) hours as needed.    levothyroxine (SYNTHROID) 175 MCG tablet TAKE 1 TABLET(175 MCG) BY MOUTH DAILY BEFORE BREAKFAST    lidocaine (LIDODERM) 5 % Place 1 patch onto the skin daily as needed. Remove & Discard patch within 12 hours or as directed by MD    losartan (COZAAR) 25 MG tablet TAKE 1 TABLET(25 MG) BY MOUTH DAILY (Patient not taking: No sig reported)    metoprolol tartrate (LOPRESSOR) 25 MG tablet TAKE 1 TABLET(25 MG) BY MOUTH TWICE DAILY    oxyCODONE-acetaminophen (PERCOCET/ROXICET) 5-325 MG tablet Take 1 tablet by mouth 3 (three) times daily as needed for moderate pain.    No facility-administered encounter medications on file as of 06/22/2021.    Patient Active Problem List   Diagnosis Date Noted   Requires assistance with activities of daily living (ADL) 06/06/2021   Diabetes (HCC) 07/02/2016   Essential hypertension, benign 06/29/2016   Chronic lower back pain 06/29/2016   Mild reactive airways disease 01/16/2015   DJD (degenerative joint disease) 05/07/2012   Osteopenia 05/07/2012   Vitamin D deficiency 02/21/2009   Hypothyroidism following radioiodine  therapy 03/10/2008   Morbid obesity (HCC) 12/13/2007   Anxiety state 12/13/2007    Conditions to be addressed/monitored: HTN, DMII, and Hypothyroidism ; Level of care concerns and Limited access to caregiver  Care Plan : Social Work Care Plan  Updates made by Bevelyn Ngo since 06/22/2021 12:00 AM     Problem: Mobility and Independence      Goal:  Mobility and Independence Optimized   Start Date: 06/09/2021  Priority: High  Note:   Current Barriers:  Chronic disease management support and education needs related to  HTN, DM, Chronic Lower Back Pain, Hypothyroidism   Level of care concerns Unable to stay in friends home after Friday - limited support system  Social Worker Clinical Goal(s):  patient will work with SW to identify and address any acute and/or chronic care coordination needs related to the self health management of  HTN, Hypothyroidism, DM, Chronic Lower Back Pain   patient will work with SW to address concerns related to placement  SW Interventions:  Inter-disciplinary care team collaboration (see longitudinal plan of care) Collaboration with Lawerance Bach, Bobette Mo, MD regarding development and update of comprehensive plan of care as evidenced by provider attestation and co-signature Collaboration with [name] at [facility] who reports patient is doing well and participating in therapy Discussed patient plans to discharge to live with friends Reviewed patient previously lived with friends prior to hospitalization but this was not a long-term option Provided Christy with contact number to SW requesting this SW be contacted when discharge planning begins so SW can follow up with patient care management needs once she returns home  Patient Goals/Self-Care Activities patient will:   -  Participate with therapy   Follow Up Plan:  SW will continue to follow       Follow Up Plan:  SW will follow up on patient care management needs post discharge from SNF.      Bevelyn Ngo, BSW, CDP Social Worker, Certified Dementia Practitioner [facility] Care Management [phone]

## 2021-07-01 ENCOUNTER — Telehealth: Payer: Self-pay

## 2021-07-01 NOTE — Telephone Encounter (Signed)
  Care Management   Follow Up Note   07/01/2021 Name: Debra Watkins MRN: [id number] DOB: 1956-05-22   Referred by: Pincus Sanes, MD Reason for referral : Chronic Care Management (Unsuccessful call)   SW placed an unsuccessful outreach to [name] with [facility] in response to a voice message received. SW left a HIPAA compliant voice message requesting a return call.  Follow Up Plan:  SW will follow up over the next week.  Bevelyn Ngo, BSW, CDP Social Worker, Certified Dementia Practitioner [facility] Care Management [phone]

## 2021-07-05 ENCOUNTER — Ambulatory Visit: Payer: Self-pay

## 2021-07-05 DIAGNOSIS — I10 Essential (primary) hypertension: Secondary | ICD-10-CM

## 2021-07-05 DIAGNOSIS — E1169 Type 2 diabetes mellitus with other specified complication: Secondary | ICD-10-CM

## 2021-07-05 DIAGNOSIS — G8929 Other chronic pain: Secondary | ICD-10-CM

## 2021-07-05 DIAGNOSIS — Z794 Long term (current) use of insulin: Secondary | ICD-10-CM

## 2021-07-05 NOTE — Chronic Care Management (AMB) (Addendum)
Social Work Note  07/05/2021 Name: Debra Watkins MRN: [id number] DOB: 11/06/1955  Debra Watkins is a 65 y.o. year old female who is a primary care patient of Burns, Bobette Mo, MD.  The Care Management team was consulted for assistance with chronic disease management and care coordination needs.  Ms. Rainwater was given information about Care Management services today including:  Care Management services include personalized support from designated clinical staff supervised by her physician, including individualized plan of care and coordination with other care providers 24/7 contact phone numbers for assistance for urgent and routine care needs. The patient may stop care management services at any time (effective at the end of the month) by phone call to the office staff.  Patient agreed to services and consent obtained.   Engaged with patient by telephone for follow up visit in response to provider referral for social work chronic care management and care coordination services.  Assessment: Review of patient past medical history, allergies, medications, and health status, including review of pertinent consultant reports was performed as part of comprehensive evaluation and provision of care management/care coordination services.   SDOH (Social Determinants of Health) assessments and interventions performed:  No    Advanced Directives Status: Not addressed in this encounter.  Care Plan  Allergies  Allergen Reactions   Penicillins     Did it involve swelling of the face/tongue/throat, SOB, or low BP? No Did it involve sudden or severe rash/hives, skin peeling, or any reaction on the inside of your mouth or nose? Yes Did you need to seek medical attention at a hospital or doctor's office? No When did it last happen?    Several Years Ago   If all above answers are "NO", may proceed with cephalosporin use.     Tetanus Toxoid     REACTION: hives    Outpatient Encounter  Medications as of 07/05/2021  Medication Sig Note   albuterol (VENTOLIN HFA) 108 (90 Base) MCG/ACT inhaler INHALE 2 PUFFS INTO THE LUNGS EVERY 6 HOURS AS NEEDED    gabapentin (NEURONTIN) 300 MG capsule Take 300 mg by mouth 2 (two) times daily. 06/10/2021: Need Refills   HYDROcodone-acetaminophen (NORCO/VICODIN) 5-325 MG tablet Take 1 tablet by mouth every 4 (four) hours as needed.    ibuprofen (ADVIL) 600 MG tablet Take 1 tablet (600 mg total) by mouth every 6 (six) hours as needed.    levothyroxine (SYNTHROID) 175 MCG tablet TAKE 1 TABLET(175 MCG) BY MOUTH DAILY BEFORE BREAKFAST    lidocaine (LIDODERM) 5 % Place 1 patch onto the skin daily as needed. Remove & Discard patch within 12 hours or as directed by MD    losartan (COZAAR) 25 MG tablet TAKE 1 TABLET(25 MG) BY MOUTH DAILY (Patient not taking: No sig reported)    metoprolol tartrate (LOPRESSOR) 25 MG tablet TAKE 1 TABLET(25 MG) BY MOUTH TWICE DAILY    oxyCODONE-acetaminophen (PERCOCET/ROXICET) 5-325 MG tablet Take 1 tablet by mouth 3 (three) times daily as needed for moderate pain.    No facility-administered encounter medications on file as of 07/05/2021.    Patient Active Problem List   Diagnosis Date Noted   Requires assistance with activities of daily living (ADL) 06/06/2021   Diabetes (HCC) 07/02/2016   Essential hypertension, benign 06/29/2016   Chronic lower back pain 06/29/2016   Mild reactive airways disease 01/16/2015   DJD (degenerative joint disease) 05/07/2012   Osteopenia 05/07/2012   Vitamin D deficiency 02/21/2009   Hypothyroidism following radioiodine therapy 03/10/2008  Morbid obesity (HCC) 12/13/2007   Anxiety state 12/13/2007    Conditions to be addressed/monitored: HTN, DMII, and Chronic lower back pain ; Level of care concerns  Patient Care Plan: RN Care Manager Plan of Care  Completed 07/05/2021   Problem Identified: Chronic Disease Management and Care Coordination Needs in a Patient with DJD, Osteopenia,  HTN, and DMII Resolved 07/05/2021  Priority: High     Long-Range Goal: Development of Plan of Care for long term management of patient with mutliple co-morbid conditions and ADL/IADL barriers (DJD, Osteopenia, HTN, DMII) Completed 07/05/2021  Start Date: 06/09/2021  Expected End Date: 09/07/2021  Priority: High  Note:   Current Barriers:  Care Coordination needs related to Level of care concerns and ADL IADL limitations  Chronic Disease Management support and education needs related to HTN, DMII, and DJD, Osteopenia  RNCM Clinical Goal(s):  Patient will verbalize basic understanding of HTN, DMII, Osteopenia, and DJD disease process and self health management plan as evidenced by patient and caregiver verbal report of understanding of plan of care work with Child psychotherapist to address Level of care concerns and ADL IADL limitations related to the management of HTN, DMII, Osteopenia, and DJD as evidenced by review of EMR and patient or Child psychotherapist report     through collaboration with Medical illustrator, provider, and care team.   Interventions: 1:1 collaboration with primary care provider regarding development and update of comprehensive plan of care as evidenced by provider attestation and co-signature Inter-disciplinary care team collaboration (see longitudinal plan of care) Evaluation of current treatment plan related to  self management and patient's adherence to plan as established by provider   Interdisciplinary Collaboration:  (Status: New goal.) Long Term Goal  Collaborated with BSW to initiate plan of care to address needs related to Level of care concerns and ADL IADL limitations in patient with HTN, DM, and osteopenia and DJD Collaboration with Pincus Sanes, MD regarding development and update of comprehensive plan of care as evidenced by provider attestation and co-signature Inter-disciplinary care team collaboration (see longitudinal plan of care)  Patient Goals/Self-Care  Activities: Patient will attend all scheduled provider appointments as evidenced by clinician review of documented attendance to scheduled appointments and patient/caregiver report Patient will call provider office for new concerns or questions as evidenced by review of documented incoming telephone call notes and patient report Patient will work with BSW to address care coordination needs and will continue to work with the clinical team to address health care and disease management related needs as evidenced by documented adherence to scheduled care management/care coordination appointments      Patient Care Plan: Social Work Care Plan  Completed 07/05/2021   Problem Identified: Mobility and Independence Resolved 07/05/2021     Goal: Mobility and Independence Optimized Completed 07/05/2021  Start Date: 06/09/2021  Priority: High  Note:   Current Barriers:  Chronic disease management support and education needs related to  HTN, DM, Chronic Lower Back Pain, Hypothyroidism   Level of care concerns Unable to stay in friends home after Friday - limited support system  Social Worker Clinical Goal(s):  patient will work with SW to identify and address any acute and/or chronic care coordination needs related to the self health management of  HTN, Hypothyroidism, DM, Chronic Lower Back Pain   patient will work with SW to address concerns related to placement  SW Interventions:  Inter-disciplinary care team collaboration (see longitudinal plan of care) Collaboration with Lawerance Bach, Bobette Mo, MD regarding development  and update of comprehensive plan of care as evidenced by provider attestation and co-signature Inbound call received from the patient requesting SW assist with obtain HIPAA verification forms to allow her employer to send short term disability paperwork to her in rehab Advised the patient SW would reach out to a colleague to inquire what forms may be needed as this SW does not usually assist  with such request Determined the patients health care provider does not possess short term disability HIPAA forms and the patient would need to contact her employer to request this form be sent to her for approval Unsuccessful outbound call placed to the patient - unable to leave a voice mail Outbound call placed to [name] with [facility] - voice message left requesting she advise the patient to have her employer send needed document to her via fax for signature Return call received from [name] advising she will advise the patient to have her employer fax the needed paperwork Discussed the patient will be discharging this week to an assisted living facility in either [place] or [place] - Mrs. Berdie Ogren stated she believed in was [facility] but her system was currently down and she could not confirm name of facility Discussed an in depth care plan meeting was held with patient and her family participating to determine the patient could not safely return home or live with family members. Patient is agreeable to placement in assisted living at this time with possibility of returning home if she is able to in the future Collaboration with patients primary care provider to advise of plan for placement SW to perform a case closure with option to re-open case if patient returns home in the future  Patient Goals/Self-Care Activities patient will:   - Follow recommendations for placement into ALF          Follow Up Plan: No care management follow up planned at this time.      Bevelyn Ngo, BSW, CDP Social Worker, Certified Dementia Practitioner  [facility] Care Management [phone]

## 2021-07-08 NOTE — Telephone Encounter (Signed)
Message left for Ms. Corlis again today to return call regarding patient and FMLA paperwork.

## 2022-01-08 DIAGNOSIS — G8929 Other chronic pain: Secondary | ICD-10-CM | POA: Diagnosis not present

## 2022-01-08 DIAGNOSIS — M5416 Radiculopathy, lumbar region: Secondary | ICD-10-CM | POA: Diagnosis not present

## 2022-01-08 DIAGNOSIS — G4700 Insomnia, unspecified: Secondary | ICD-10-CM | POA: Diagnosis not present

## 2022-01-08 DIAGNOSIS — Z0000 Encounter for general adult medical examination without abnormal findings: Secondary | ICD-10-CM | POA: Diagnosis not present

## 2022-01-08 DIAGNOSIS — M4800 Spinal stenosis, site unspecified: Secondary | ICD-10-CM | POA: Diagnosis not present

## 2022-01-08 DIAGNOSIS — G629 Polyneuropathy, unspecified: Secondary | ICD-10-CM | POA: Diagnosis not present

## 2022-01-08 DIAGNOSIS — E785 Hyperlipidemia, unspecified: Secondary | ICD-10-CM | POA: Diagnosis not present

## 2022-01-08 DIAGNOSIS — I10 Essential (primary) hypertension: Secondary | ICD-10-CM | POA: Diagnosis not present

## 2022-01-10 DIAGNOSIS — F4321 Adjustment disorder with depressed mood: Secondary | ICD-10-CM | POA: Diagnosis not present

## 2022-01-10 DIAGNOSIS — M5416 Radiculopathy, lumbar region: Secondary | ICD-10-CM | POA: Diagnosis not present

## 2022-01-10 DIAGNOSIS — E119 Type 2 diabetes mellitus without complications: Secondary | ICD-10-CM | POA: Diagnosis not present

## 2022-01-10 DIAGNOSIS — M4800 Spinal stenosis, site unspecified: Secondary | ICD-10-CM | POA: Diagnosis not present

## 2022-01-10 DIAGNOSIS — J45909 Unspecified asthma, uncomplicated: Secondary | ICD-10-CM | POA: Diagnosis not present

## 2022-01-10 DIAGNOSIS — E559 Vitamin D deficiency, unspecified: Secondary | ICD-10-CM | POA: Diagnosis not present

## 2022-01-10 DIAGNOSIS — G4700 Insomnia, unspecified: Secondary | ICD-10-CM | POA: Diagnosis not present

## 2022-01-10 DIAGNOSIS — E785 Hyperlipidemia, unspecified: Secondary | ICD-10-CM | POA: Diagnosis not present

## 2022-01-10 DIAGNOSIS — G629 Polyneuropathy, unspecified: Secondary | ICD-10-CM | POA: Diagnosis not present

## 2022-01-11 DIAGNOSIS — E559 Vitamin D deficiency, unspecified: Secondary | ICD-10-CM | POA: Diagnosis not present

## 2022-01-11 DIAGNOSIS — D518 Other vitamin B12 deficiency anemias: Secondary | ICD-10-CM | POA: Diagnosis not present

## 2022-01-11 DIAGNOSIS — E785 Hyperlipidemia, unspecified: Secondary | ICD-10-CM | POA: Diagnosis not present

## 2022-01-11 DIAGNOSIS — E039 Hypothyroidism, unspecified: Secondary | ICD-10-CM | POA: Diagnosis not present

## 2022-01-11 DIAGNOSIS — I10 Essential (primary) hypertension: Secondary | ICD-10-CM | POA: Diagnosis not present

## 2022-01-11 DIAGNOSIS — E038 Other specified hypothyroidism: Secondary | ICD-10-CM | POA: Diagnosis not present

## 2022-01-11 DIAGNOSIS — E119 Type 2 diabetes mellitus without complications: Secondary | ICD-10-CM | POA: Diagnosis not present

## 2022-01-11 DIAGNOSIS — F4321 Adjustment disorder with depressed mood: Secondary | ICD-10-CM | POA: Diagnosis not present

## 2022-01-11 DIAGNOSIS — J45909 Unspecified asthma, uncomplicated: Secondary | ICD-10-CM | POA: Diagnosis not present

## 2022-01-13 DIAGNOSIS — E038 Other specified hypothyroidism: Secondary | ICD-10-CM | POA: Diagnosis not present

## 2022-01-13 DIAGNOSIS — E039 Hypothyroidism, unspecified: Secondary | ICD-10-CM | POA: Diagnosis not present

## 2022-01-13 DIAGNOSIS — Z79899 Other long term (current) drug therapy: Secondary | ICD-10-CM | POA: Diagnosis not present

## 2022-01-25 DIAGNOSIS — I10 Essential (primary) hypertension: Secondary | ICD-10-CM | POA: Diagnosis not present

## 2022-01-27 DIAGNOSIS — R011 Cardiac murmur, unspecified: Secondary | ICD-10-CM | POA: Diagnosis not present

## 2022-02-07 DIAGNOSIS — E039 Hypothyroidism, unspecified: Secondary | ICD-10-CM | POA: Diagnosis not present

## 2022-02-07 DIAGNOSIS — G8929 Other chronic pain: Secondary | ICD-10-CM | POA: Diagnosis not present

## 2022-02-07 DIAGNOSIS — I10 Essential (primary) hypertension: Secondary | ICD-10-CM | POA: Diagnosis not present

## 2022-02-07 DIAGNOSIS — M5416 Radiculopathy, lumbar region: Secondary | ICD-10-CM | POA: Diagnosis not present

## 2022-02-07 DIAGNOSIS — E119 Type 2 diabetes mellitus without complications: Secondary | ICD-10-CM | POA: Diagnosis not present

## 2022-02-07 DIAGNOSIS — E785 Hyperlipidemia, unspecified: Secondary | ICD-10-CM | POA: Diagnosis not present

## 2022-02-07 DIAGNOSIS — M4800 Spinal stenosis, site unspecified: Secondary | ICD-10-CM | POA: Diagnosis not present

## 2022-02-07 DIAGNOSIS — F32A Depression, unspecified: Secondary | ICD-10-CM | POA: Diagnosis not present

## 2022-02-07 DIAGNOSIS — J45909 Unspecified asthma, uncomplicated: Secondary | ICD-10-CM | POA: Diagnosis not present

## 2022-02-09 DIAGNOSIS — E119 Type 2 diabetes mellitus without complications: Secondary | ICD-10-CM | POA: Diagnosis not present

## 2022-02-09 DIAGNOSIS — I10 Essential (primary) hypertension: Secondary | ICD-10-CM | POA: Diagnosis not present

## 2022-02-09 DIAGNOSIS — E785 Hyperlipidemia, unspecified: Secondary | ICD-10-CM | POA: Diagnosis not present

## 2022-02-09 DIAGNOSIS — E038 Other specified hypothyroidism: Secondary | ICD-10-CM | POA: Diagnosis not present

## 2022-02-09 DIAGNOSIS — J45909 Unspecified asthma, uncomplicated: Secondary | ICD-10-CM | POA: Diagnosis not present

## 2022-02-09 DIAGNOSIS — F4321 Adjustment disorder with depressed mood: Secondary | ICD-10-CM | POA: Diagnosis not present

## 2022-02-09 DIAGNOSIS — E039 Hypothyroidism, unspecified: Secondary | ICD-10-CM | POA: Diagnosis not present

## 2022-02-09 DIAGNOSIS — D518 Other vitamin B12 deficiency anemias: Secondary | ICD-10-CM | POA: Diagnosis not present

## 2022-02-09 DIAGNOSIS — E559 Vitamin D deficiency, unspecified: Secondary | ICD-10-CM | POA: Diagnosis not present

## 2022-02-16 DIAGNOSIS — I6523 Occlusion and stenosis of bilateral carotid arteries: Secondary | ICD-10-CM | POA: Diagnosis not present

## 2022-02-16 DIAGNOSIS — R0989 Other specified symptoms and signs involving the circulatory and respiratory systems: Secondary | ICD-10-CM | POA: Diagnosis not present

## 2022-02-21 DIAGNOSIS — E039 Hypothyroidism, unspecified: Secondary | ICD-10-CM | POA: Diagnosis not present

## 2022-02-21 DIAGNOSIS — Z79899 Other long term (current) drug therapy: Secondary | ICD-10-CM | POA: Diagnosis not present

## 2022-02-23 DIAGNOSIS — I77811 Abdominal aortic ectasia: Secondary | ICD-10-CM | POA: Diagnosis not present

## 2022-02-23 DIAGNOSIS — F4324 Adjustment disorder with disturbance of conduct: Secondary | ICD-10-CM | POA: Diagnosis not present

## 2022-02-23 DIAGNOSIS — G8929 Other chronic pain: Secondary | ICD-10-CM | POA: Diagnosis not present

## 2022-02-24 DIAGNOSIS — I10 Essential (primary) hypertension: Secondary | ICD-10-CM | POA: Diagnosis not present

## 2022-03-02 DIAGNOSIS — R221 Localized swelling, mass and lump, neck: Secondary | ICD-10-CM | POA: Diagnosis not present

## 2022-03-02 DIAGNOSIS — E042 Nontoxic multinodular goiter: Secondary | ICD-10-CM | POA: Diagnosis not present

## 2022-03-11 DIAGNOSIS — I10 Essential (primary) hypertension: Secondary | ICD-10-CM | POA: Diagnosis not present

## 2022-03-11 DIAGNOSIS — F4321 Adjustment disorder with depressed mood: Secondary | ICD-10-CM | POA: Diagnosis not present

## 2022-03-11 DIAGNOSIS — J45909 Unspecified asthma, uncomplicated: Secondary | ICD-10-CM | POA: Diagnosis not present

## 2022-03-11 DIAGNOSIS — D518 Other vitamin B12 deficiency anemias: Secondary | ICD-10-CM | POA: Diagnosis not present

## 2022-03-11 DIAGNOSIS — E785 Hyperlipidemia, unspecified: Secondary | ICD-10-CM | POA: Diagnosis not present

## 2022-03-11 DIAGNOSIS — E559 Vitamin D deficiency, unspecified: Secondary | ICD-10-CM | POA: Diagnosis not present

## 2022-03-11 DIAGNOSIS — E039 Hypothyroidism, unspecified: Secondary | ICD-10-CM | POA: Diagnosis not present

## 2022-03-11 DIAGNOSIS — E119 Type 2 diabetes mellitus without complications: Secondary | ICD-10-CM | POA: Diagnosis not present

## 2022-03-11 DIAGNOSIS — R2232 Localized swelling, mass and lump, left upper limb: Secondary | ICD-10-CM | POA: Diagnosis not present

## 2022-03-11 DIAGNOSIS — E038 Other specified hypothyroidism: Secondary | ICD-10-CM | POA: Diagnosis not present

## 2022-03-14 DIAGNOSIS — E119 Type 2 diabetes mellitus without complications: Secondary | ICD-10-CM | POA: Diagnosis not present

## 2022-03-14 DIAGNOSIS — E559 Vitamin D deficiency, unspecified: Secondary | ICD-10-CM | POA: Diagnosis not present

## 2022-03-14 DIAGNOSIS — E039 Hypothyroidism, unspecified: Secondary | ICD-10-CM | POA: Diagnosis not present

## 2022-03-14 DIAGNOSIS — F411 Generalized anxiety disorder: Secondary | ICD-10-CM | POA: Diagnosis not present

## 2022-03-14 DIAGNOSIS — G629 Polyneuropathy, unspecified: Secondary | ICD-10-CM | POA: Diagnosis not present

## 2022-03-14 DIAGNOSIS — I70223 Atherosclerosis of native arteries of extremities with rest pain, bilateral legs: Secondary | ICD-10-CM | POA: Diagnosis not present

## 2022-03-14 DIAGNOSIS — J45909 Unspecified asthma, uncomplicated: Secondary | ICD-10-CM | POA: Diagnosis not present

## 2022-03-14 DIAGNOSIS — E785 Hyperlipidemia, unspecified: Secondary | ICD-10-CM | POA: Diagnosis not present

## 2022-03-14 DIAGNOSIS — M4800 Spinal stenosis, site unspecified: Secondary | ICD-10-CM | POA: Diagnosis not present

## 2022-03-14 DIAGNOSIS — M5416 Radiculopathy, lumbar region: Secondary | ICD-10-CM | POA: Diagnosis not present

## 2022-03-17 DIAGNOSIS — E782 Mixed hyperlipidemia: Secondary | ICD-10-CM | POA: Diagnosis not present

## 2022-03-17 DIAGNOSIS — E038 Other specified hypothyroidism: Secondary | ICD-10-CM | POA: Diagnosis not present

## 2022-03-17 DIAGNOSIS — E559 Vitamin D deficiency, unspecified: Secondary | ICD-10-CM | POA: Diagnosis not present

## 2022-03-17 DIAGNOSIS — E119 Type 2 diabetes mellitus without complications: Secondary | ICD-10-CM | POA: Diagnosis not present

## 2022-03-17 DIAGNOSIS — D518 Other vitamin B12 deficiency anemias: Secondary | ICD-10-CM | POA: Diagnosis not present

## 2022-03-17 DIAGNOSIS — Z79899 Other long term (current) drug therapy: Secondary | ICD-10-CM | POA: Diagnosis not present

## 2022-03-18 DIAGNOSIS — R590 Localized enlarged lymph nodes: Secondary | ICD-10-CM | POA: Diagnosis not present

## 2022-03-24 DIAGNOSIS — Z20828 Contact with and (suspected) exposure to other viral communicable diseases: Secondary | ICD-10-CM | POA: Diagnosis not present

## 2022-03-24 DIAGNOSIS — U071 COVID-19: Secondary | ICD-10-CM | POA: Diagnosis not present

## 2022-03-27 DIAGNOSIS — I10 Essential (primary) hypertension: Secondary | ICD-10-CM | POA: Diagnosis not present

## 2022-03-31 DIAGNOSIS — E039 Hypothyroidism, unspecified: Secondary | ICD-10-CM | POA: Diagnosis not present

## 2022-03-31 DIAGNOSIS — Z79899 Other long term (current) drug therapy: Secondary | ICD-10-CM | POA: Diagnosis not present

## 2022-03-31 DIAGNOSIS — U071 COVID-19: Secondary | ICD-10-CM | POA: Diagnosis not present

## 2022-03-31 DIAGNOSIS — Z20828 Contact with and (suspected) exposure to other viral communicable diseases: Secondary | ICD-10-CM | POA: Diagnosis not present

## 2022-04-07 DIAGNOSIS — Z20828 Contact with and (suspected) exposure to other viral communicable diseases: Secondary | ICD-10-CM | POA: Diagnosis not present

## 2022-04-07 DIAGNOSIS — U071 COVID-19: Secondary | ICD-10-CM | POA: Diagnosis not present

## 2022-04-08 DIAGNOSIS — Z79899 Other long term (current) drug therapy: Secondary | ICD-10-CM | POA: Diagnosis not present

## 2022-04-08 DIAGNOSIS — F4324 Adjustment disorder with disturbance of conduct: Secondary | ICD-10-CM | POA: Diagnosis not present

## 2022-04-08 DIAGNOSIS — G8929 Other chronic pain: Secondary | ICD-10-CM | POA: Diagnosis not present

## 2022-04-09 DIAGNOSIS — E038 Other specified hypothyroidism: Secondary | ICD-10-CM | POA: Diagnosis not present

## 2022-04-09 DIAGNOSIS — D518 Other vitamin B12 deficiency anemias: Secondary | ICD-10-CM | POA: Diagnosis not present

## 2022-04-09 DIAGNOSIS — J45909 Unspecified asthma, uncomplicated: Secondary | ICD-10-CM | POA: Diagnosis not present

## 2022-04-09 DIAGNOSIS — E039 Hypothyroidism, unspecified: Secondary | ICD-10-CM | POA: Diagnosis not present

## 2022-04-09 DIAGNOSIS — E785 Hyperlipidemia, unspecified: Secondary | ICD-10-CM | POA: Diagnosis not present

## 2022-04-09 DIAGNOSIS — E119 Type 2 diabetes mellitus without complications: Secondary | ICD-10-CM | POA: Diagnosis not present

## 2022-04-09 DIAGNOSIS — I10 Essential (primary) hypertension: Secondary | ICD-10-CM | POA: Diagnosis not present

## 2022-04-09 DIAGNOSIS — E559 Vitamin D deficiency, unspecified: Secondary | ICD-10-CM | POA: Diagnosis not present

## 2022-04-09 DIAGNOSIS — F4321 Adjustment disorder with depressed mood: Secondary | ICD-10-CM | POA: Diagnosis not present

## 2022-04-28 DIAGNOSIS — E119 Type 2 diabetes mellitus without complications: Secondary | ICD-10-CM | POA: Diagnosis not present

## 2022-04-28 DIAGNOSIS — F32A Depression, unspecified: Secondary | ICD-10-CM | POA: Diagnosis not present

## 2022-04-28 DIAGNOSIS — E039 Hypothyroidism, unspecified: Secondary | ICD-10-CM | POA: Diagnosis not present

## 2022-04-28 DIAGNOSIS — I10 Essential (primary) hypertension: Secondary | ICD-10-CM | POA: Diagnosis not present

## 2022-04-28 DIAGNOSIS — M4800 Spinal stenosis, site unspecified: Secondary | ICD-10-CM | POA: Diagnosis not present

## 2022-04-28 DIAGNOSIS — J45909 Unspecified asthma, uncomplicated: Secondary | ICD-10-CM | POA: Diagnosis not present

## 2022-04-28 DIAGNOSIS — M5416 Radiculopathy, lumbar region: Secondary | ICD-10-CM | POA: Diagnosis not present

## 2022-04-28 DIAGNOSIS — G629 Polyneuropathy, unspecified: Secondary | ICD-10-CM | POA: Diagnosis not present

## 2022-04-28 DIAGNOSIS — E785 Hyperlipidemia, unspecified: Secondary | ICD-10-CM | POA: Diagnosis not present

## 2022-05-10 DIAGNOSIS — I10 Essential (primary) hypertension: Secondary | ICD-10-CM | POA: Diagnosis not present

## 2022-05-10 DIAGNOSIS — D518 Other vitamin B12 deficiency anemias: Secondary | ICD-10-CM | POA: Diagnosis not present

## 2022-05-10 DIAGNOSIS — E559 Vitamin D deficiency, unspecified: Secondary | ICD-10-CM | POA: Diagnosis not present

## 2022-05-10 DIAGNOSIS — J45909 Unspecified asthma, uncomplicated: Secondary | ICD-10-CM | POA: Diagnosis not present

## 2022-05-10 DIAGNOSIS — E038 Other specified hypothyroidism: Secondary | ICD-10-CM | POA: Diagnosis not present

## 2022-05-10 DIAGNOSIS — E785 Hyperlipidemia, unspecified: Secondary | ICD-10-CM | POA: Diagnosis not present

## 2022-05-10 DIAGNOSIS — E119 Type 2 diabetes mellitus without complications: Secondary | ICD-10-CM | POA: Diagnosis not present

## 2022-05-10 DIAGNOSIS — E039 Hypothyroidism, unspecified: Secondary | ICD-10-CM | POA: Diagnosis not present

## 2022-05-10 DIAGNOSIS — F4321 Adjustment disorder with depressed mood: Secondary | ICD-10-CM | POA: Diagnosis not present

## 2022-05-12 DIAGNOSIS — Z79899 Other long term (current) drug therapy: Secondary | ICD-10-CM | POA: Diagnosis not present

## 2022-05-12 DIAGNOSIS — E038 Other specified hypothyroidism: Secondary | ICD-10-CM | POA: Diagnosis not present

## 2022-05-30 ENCOUNTER — Encounter: Payer: Self-pay | Admitting: Internal Medicine

## 2022-05-30 NOTE — Progress Notes (Unsigned)
Subjective:    Patient ID: Debra Watkins, female    DOB: 05-Jun-1956, 66 y.o.   MRN: [id number]     HPI Debra Watkins is here for follow up of her chronic medical problems and follow-up from rehab.  She was in [facility] from last October to April.  She was moved to [employer] in [place].  She went home last Sunday - 5 days ago.  She is now living alone at her condo.  Chronic medical conditions - DM, htn, hypothyroidism, carotid artery disease  She was last here about one year ago with severe back pain d/t spinal stenosis.  Her pain now is better and controlled.  Doing some walking.    Feet and ankle swelling.  She does not elevate legs when sitting.  She noticed the swelling a few months ago.  She has been sleeping sitting up.  She is waiting for her new bedroom set to come to her house-family member is helping with that and she is not sure what will happen.  Chronic back pain - taking hydrocodone twice daily and taking the gabapentin twice daily.  Uses lidocaine patches.  She does have anxiety.  In the past she was on alprazolam and used only as needed and not often.  She would like to have that again.  Medications and allergies reviewed with patient and updated if appropriate.  Current Outpatient Medications on File Prior to Visit  Medication Sig Dispense Refill   levothyroxine (SYNTHROID) 112 MCG tablet Take 112 mcg by mouth daily.     lidocaine (LIDODERM) 5 % Place 1 patch onto the skin daily as needed. Remove & Discard patch within 12 hours or as directed by MD 15 patch 0   No current facility-administered medications on file prior to visit.     Review of Systems  Constitutional:  Negative for fever.  Respiratory:  Negative for cough, shortness of breath and wheezing.   Cardiovascular:  Positive for leg swelling. Negative for chest pain and palpitations.  Gastrointestinal:  Positive for diarrhea (improved). Negative for abdominal pain and  constipation.  Musculoskeletal:  Positive for back pain.  Neurological:  Negative for light-headedness, numbness and headaches.  Psychiatric/Behavioral:  Negative for dysphoric mood. The patient is nervous/anxious.        Objective:   Vitals:   05/31/22 0759  BP: (!) 146/82  Pulse: 75  Temp: 98 F (36.7 C)  SpO2: 98%   BP Readings from Last 3 Encounters:  05/31/22 (!) 146/82  06/15/21 117/73  06/04/21 116/80   Wt Readings from Last 3 Encounters:  05/31/22 228 lb 3.2 oz (103.5 kg)  06/10/21 230 lb (104.3 kg)  11/19/19 253 lb 12.8 oz (115.1 kg)   Body mass index is 39.17 kg/m.    Physical Exam Constitutional:      General: She is not in acute distress.    Appearance: Normal appearance.  HENT:     Head: Normocephalic and atraumatic.  Eyes:     Conjunctiva/sclera: Conjunctivae normal.  Cardiovascular:     Rate and Rhythm: Normal rate and regular rhythm.     Heart sounds: Normal heart sounds. No murmur heard. Pulmonary:     Effort: Pulmonary effort is normal. No respiratory distress.     Breath sounds: Normal breath sounds. No wheezing.  Abdominal:     General: There is no distension.     Palpations: Abdomen is soft.     Tenderness: There is no abdominal  tenderness.  Musculoskeletal:     Cervical back: Neck supple.     Right lower leg: Edema (2 + nonpitting) present.     Left lower leg: Edema (2 + nonpitting) present.  Lymphadenopathy:     Cervical: No cervical adenopathy.  Skin:    General: Skin is warm and dry.     Findings: No rash.  Neurological:     Mental Status: She is alert. Mental status is at baseline.  Psychiatric:        Mood and Affect: Mood normal.        Behavior: Behavior normal.       Diabetic Foot Exam - Simple   Simple Foot Form Diabetic Foot exam was performed with the following findings: Yes   Visual Inspection No deformities, no ulcerations, no other skin breakdown bilaterally: Yes Sensation Testing Intact to touch and  monofilament testing bilaterally: Yes Pulse Check Posterior Tibialis and Dorsalis pulse intact bilaterally: Yes Comments      Lab Results  Component Value Date   WBC 6.1 05/31/2022   HGB 12.8 05/31/2022   HCT 39.0 05/31/2022   PLT 224.0 05/31/2022   GLUCOSE 111 (H) 05/31/2022   CHOL 149 05/31/2022   TRIG 105.0 05/31/2022   HDL 51.10 05/31/2022   LDLDIRECT 121.2 05/07/2012   LDLCALC 77 05/31/2022   ALT 12 05/31/2022   AST 18 05/31/2022   NA 139 05/31/2022   K 3.5 05/31/2022   CL 99 05/31/2022   CREATININE 0.71 05/31/2022   BUN 12 05/31/2022   CO2 31 05/31/2022   TSH 3.02 05/31/2022   HGBA1C 6.4 05/31/2022   MICROALBUR 30.6 (H) 11/19/2019     Assessment & Plan:    See Problem List for Assessment and Plan of chronic medical problems.

## 2022-05-30 NOTE — Patient Instructions (Signed)
     Flu and pneumonia vaccine   Blood work was ordered.   The lab is on the first floor.    Medications changes include :  alprazolam as needed      Return in about 3 months (around 08/31/2022) for follow up.

## 2022-05-31 ENCOUNTER — Ambulatory Visit (INDEPENDENT_AMBULATORY_CARE_PROVIDER_SITE_OTHER): Payer: BC Managed Care – PPO | Admitting: Internal Medicine

## 2022-05-31 ENCOUNTER — Encounter: Payer: Self-pay | Admitting: Internal Medicine

## 2022-05-31 VITALS — BP 146/82 | HR 75 | Temp 98.0°F | Ht 64.0 in | Wt 228.2 lb

## 2022-05-31 DIAGNOSIS — Z23 Encounter for immunization: Secondary | ICD-10-CM | POA: Diagnosis not present

## 2022-05-31 DIAGNOSIS — R600 Localized edema: Secondary | ICD-10-CM

## 2022-05-31 DIAGNOSIS — R6889 Other general symptoms and signs: Secondary | ICD-10-CM | POA: Diagnosis not present

## 2022-05-31 DIAGNOSIS — Z794 Long term (current) use of insulin: Secondary | ICD-10-CM | POA: Diagnosis not present

## 2022-05-31 DIAGNOSIS — M48061 Spinal stenosis, lumbar region without neurogenic claudication: Secondary | ICD-10-CM

## 2022-05-31 DIAGNOSIS — F411 Generalized anxiety disorder: Secondary | ICD-10-CM | POA: Diagnosis not present

## 2022-05-31 DIAGNOSIS — I10 Essential (primary) hypertension: Secondary | ICD-10-CM

## 2022-05-31 DIAGNOSIS — E1169 Type 2 diabetes mellitus with other specified complication: Secondary | ICD-10-CM | POA: Diagnosis not present

## 2022-05-31 DIAGNOSIS — J4520 Mild intermittent asthma, uncomplicated: Secondary | ICD-10-CM | POA: Diagnosis not present

## 2022-05-31 DIAGNOSIS — E890 Postprocedural hypothyroidism: Secondary | ICD-10-CM

## 2022-05-31 DIAGNOSIS — M4806 Spinal stenosis, lumbar region: Secondary | ICD-10-CM | POA: Diagnosis not present

## 2022-05-31 LAB — COMPREHENSIVE METABOLIC PANEL
ALT: 12 U/L (ref 0–35)
AST: 18 U/L (ref 0–37)
Albumin: 3.9 g/dL (ref 3.5–5.2)
Alkaline Phosphatase: 78 U/L (ref 39–117)
BUN: 12 mg/dL (ref 6–23)
CO2: 31 mEq/L (ref 19–32)
Calcium: 9.4 mg/dL (ref 8.4–10.5)
Chloride: 99 mEq/L (ref 96–112)
Creatinine, Ser: 0.71 mg/dL (ref 0.40–1.20)
GFR: 89.15 mL/min (ref >60.00)
Glucose, Bld: 111 mg/dL — ABNORMAL HIGH (ref 70–99)
Potassium: 3.5 mEq/L (ref 3.5–5.1)
Sodium: 139 mEq/L (ref 135–145)
Total Bilirubin: 0.4 mg/dL (ref 0.2–1.2)
Total Protein: 7.5 g/dL (ref 6.0–8.3)

## 2022-05-31 LAB — LIPID PANEL
Cholesterol: 149 mg/dL (ref 0–200)
HDL: 51.1 mg/dL (ref >39.00)
LDL Cholesterol: 77 mg/dL (ref 0–99)
NonHDL: 98.02
Total CHOL/HDL Ratio: 3
Triglycerides: 105 mg/dL (ref 0.0–149.0)
VLDL: 21 mg/dL (ref 0.0–40.0)

## 2022-05-31 LAB — CBC WITH DIFFERENTIAL/PLATELET
Basophils Absolute: 0 10*3/uL (ref 0.0–0.1)
Basophils Relative: 0.7 % (ref 0.0–3.0)
Eosinophils Absolute: 0.4 10*3/uL (ref 0.0–0.7)
Eosinophils Relative: 6.2 % — ABNORMAL HIGH (ref 0.0–5.0)
HCT: 39 % (ref 36.0–46.0)
Hemoglobin: 12.8 g/dL (ref 12.0–15.0)
Lymphocytes Relative: 22.1 % (ref 12.0–46.0)
Lymphs Abs: 1.3 10*3/uL (ref 0.7–4.0)
MCHC: 32.9 g/dL (ref 30.0–36.0)
MCV: 87.5 fl (ref 78.0–100.0)
Monocytes Absolute: 0.3 10*3/uL (ref 0.1–1.0)
Monocytes Relative: 5.5 % (ref 3.0–12.0)
Neutro Abs: 4 10*3/uL (ref 1.4–7.7)
Neutrophils Relative %: 65.5 % (ref 43.0–77.0)
Platelets: 224 10*3/uL (ref 150.0–400.0)
RBC: 4.45 Mil/uL (ref 3.87–5.11)
RDW: 14.5 % (ref 11.5–15.5)
WBC: 6.1 10*3/uL (ref 4.0–10.5)

## 2022-05-31 LAB — TSH: TSH: 3.02 u[IU]/mL (ref 0.35–5.50)

## 2022-05-31 LAB — HEMOGLOBIN A1C: Hgb A1c MFr Bld: 6.4 % (ref 4.6–6.5)

## 2022-05-31 MED ORDER — METOPROLOL TARTRATE 25 MG PO TABS: 12.5000 mg | ORAL_TABLET | Freq: Two times a day (BID) | ORAL | 1 refills | Status: DC

## 2022-05-31 MED ORDER — CITALOPRAM HYDROBROMIDE 20 MG PO TABS: 20.0000 mg | ORAL_TABLET | Freq: Every day | ORAL | 1 refills | Status: DC

## 2022-05-31 MED ORDER — HYDROCODONE-ACETAMINOPHEN 5-325 MG PO TABS: 1.0000 | ORAL_TABLET | Freq: Three times a day (TID) | ORAL | 0 refills | Status: DC | PRN

## 2022-05-31 MED ORDER — ALPRAZOLAM 0.5 MG PO TABS: ORAL_TABLET | ORAL | 0 refills | Status: DC

## 2022-05-31 MED ORDER — GABAPENTIN 300 MG PO CAPS: 300.0000 mg | ORAL_CAPSULE | Freq: Two times a day (BID) | ORAL | 1 refills | Status: DC

## 2022-05-31 MED ORDER — ALBUTEROL SULFATE HFA 108 (90 BASE) MCG/ACT IN AERS: 2.0000 | INHALATION_SPRAY | Freq: Four times a day (QID) | RESPIRATORY_TRACT | 5 refills | Status: AC | PRN

## 2022-05-31 NOTE — Addendum Note (Signed)
Addended by: Marcina Millard on: 05/31/2022 12:24 PM   Modules accepted: Orders

## 2022-05-31 NOTE — Assessment & Plan Note (Signed)
Chronic  Clinically euthyroid Check tsh and will titrate med dose if needed Currently taking levothyroxine 112 mcg daily  

## 2022-05-31 NOTE — Assessment & Plan Note (Signed)
Chronic Mild, intermittent Uses albuterol inhaler as needed-refilled today

## 2022-05-31 NOTE — Assessment & Plan Note (Signed)
Chronic Somewhat controlled, but has anxiety at times Continue Celexa 20 mg daily Was on alprazolam in the past and would like to have this to use just on a rare occasion Alprazolam 0.25-0.5 mg as needed prescribed

## 2022-05-31 NOTE — Assessment & Plan Note (Signed)
New Started a few months ago Likely related to her sleeping sitting up with her legs down and not elevating her legs when she is sitting We discussed elevating her legs when she is sitting Getting new bed and will be able to start laying down hopefully soon which will make a big difference Low-sodium diet discussed Continue regular walking

## 2022-05-31 NOTE — Assessment & Plan Note (Signed)
Chronic Blood pressure well controlled typically-elevated here today CMP Currently taking metoprolol 12.5 mg once a day-advised increasing this to twice a day since this is a short acting medication We will monitor and see if we need to add back the losartan

## 2022-05-31 NOTE — Assessment & Plan Note (Signed)
Chronic Chronic pain fairly controlled right now Continue lidocaine patches, gabapentin 300 mg twice daily Hydrocodone-acetaminophen 5-325 mg - 1 pill every 8 hours as needed which she is typically taking twice a day At next visit we will do UDS and pain contract

## 2022-05-31 NOTE — Assessment & Plan Note (Signed)
Chronic   Lab Results  Component Value Date   HGBA1C 6.4 05/31/2022   Sugars controlled Check A1c, urine microalbumin today Continue lifestyle control Stressed regular exercise, diabetic diet  

## 2022-06-17 ENCOUNTER — Telehealth: Payer: Self-pay | Admitting: Internal Medicine

## 2022-06-17 ENCOUNTER — Other Ambulatory Visit: Payer: Self-pay

## 2022-06-17 MED ORDER — LEVOTHYROXINE SODIUM 112 MCG PO TABS: 112.0000 ug | ORAL_TABLET | Freq: Every day | ORAL | 0 refills | Status: DC

## 2022-06-17 NOTE — Telephone Encounter (Signed)
Caller & Relationship to patient:Debra Watkins   Call back number:[phone]   Date of last office visit:05/31/22   Date of next office visit:08/30/22   Medication(s) to be refilled:  levothyroxine (SYNTHROID) 112 MCG tablet          Preferred Pharmacy: [facility] at [address]

## 2022-06-17 NOTE — Telephone Encounter (Signed)
Refill sent in for patient.  °

## 2022-06-28 ENCOUNTER — Telehealth: Payer: Self-pay | Admitting: Internal Medicine

## 2022-06-28 NOTE — Telephone Encounter (Signed)
Pt reports swelling feet, ankles, legs. Requesting LASIX sent by FED EX.  Pt NEW phone [phone]  Additionally, asking for Hydrocodone. Pt says she is not out of it but knows it takes a long time to process and receive. Pt asking for FedEx.  Last visit 05/31/22  Next visit: 08/30/22

## 2022-06-28 NOTE — Telephone Encounter (Signed)
Attempted to reach patient but she does not have VM set up.  If she calls back please offer appointment regarding her swelling.  We can not Fed-ex scripts. They go directly to pharmacy of her choice but we are not allowed to send scripts outside of her pharmacy.  If she uses mail order we can fax it and she can call them to ask for rush shipment.

## 2022-07-06 ENCOUNTER — Encounter: Payer: Self-pay | Admitting: Internal Medicine

## 2022-07-06 NOTE — Progress Notes (Signed)
Subjective:    Patient ID: Debra Watkins, female    DOB: 11-Dec-1955, 66 y.o.   MRN: [id number]      HPI Debra Watkins is here for  Chief Complaint  Patient presents with   Leg Swelling    Swelling in ankles and feet    Swollen legs - was here 10/31 - b/l leg swelling - thought to be related to sleeping sitting up in chair - not laying down.  Was to get a new bed.    Her legs and feet are worse.  She does not have the bed set up yet and is still sleeping sitting up -- if she puts them up her knees hurt.  She does not have a recliner.  Once her brother is able he will get her mother's recliner and bed from storage and get that set up for her but she is not sure when that will happen.   Medications and allergies reviewed with patient and updated if appropriate.  Current Outpatient Medications on File Prior to Visit  Medication Sig Dispense Refill   albuterol (VENTOLIN HFA) 108 (90 Base) MCG/ACT inhaler Inhale 2 puffs into the lungs every 6 (six) hours as needed. 20.1 g 5   ALPRAZolam (XANAX) 0.5 MG tablet TAKE 1/2 TO 1 TABLET(0.25 TO 0.5 MG) BY MOUTH THREE TIMES DAILY AS NEEDED FOR ANXIETY 30 tablet 0   citalopram (CELEXA) 20 MG tablet Take 1 tablet (20 mg total) by mouth daily. 90 tablet 1   gabapentin (NEURONTIN) 300 MG capsule Take 1 capsule (300 mg total) by mouth 2 (two) times daily. 180 capsule 1   HYDROcodone-acetaminophen (NORCO/VICODIN) 5-325 MG tablet Take 1 tablet by mouth every 8 (eight) hours as needed. 90 tablet 0   levothyroxine (SYNTHROID) 112 MCG tablet Take 1 tablet (112 mcg total) by mouth daily. 90 tablet 0   lidocaine (LIDODERM) 5 % Place 1 patch onto the skin daily as needed. Remove & Discard patch within 12 hours or as directed by MD 15 patch 0   metoprolol tartrate (LOPRESSOR) 25 MG tablet Take 0.5 tablets (12.5 mg total) by mouth 2 (two) times daily. 90 tablet 1   No current facility-administered medications on file prior to visit.    Review of Systems   Constitutional:  Negative for fever.  Respiratory:  Negative for cough, shortness of breath and wheezing.   Cardiovascular:  Positive for leg swelling. Negative for chest pain and palpitations.  Neurological:  Negative for light-headedness and headaches.       Objective:   Vitals:   07/08/22 0927  BP: 120/78  Pulse: 66  Temp: 97.6 F (36.4 C)  SpO2: 97%   BP Readings from Last 3 Encounters:  07/08/22 120/78  05/31/22 (!) 146/82  06/15/21 117/73   Wt Readings from Last 3 Encounters:  07/08/22 249 lb (112.9 kg)  05/31/22 228 lb 3.2 oz (103.5 kg)  06/10/21 230 lb (104.3 kg)   Body mass index is 42.74 kg/m.    Physical Exam Constitutional:      General: She is not in acute distress.    Appearance: Normal appearance.  HENT:     Head: Normocephalic and atraumatic.  Eyes:     Conjunctiva/sclera: Conjunctivae normal.  Cardiovascular:     Rate and Rhythm: Normal rate and regular rhythm.     Heart sounds: Normal heart sounds. No murmur heard. Pulmonary:     Effort: Pulmonary effort is normal. No respiratory distress.     Breath sounds:  Normal breath sounds. No wheezing.  Musculoskeletal:     Right lower leg: Edema (1 + pitting lower legs and posterior upper legs) present.     Left lower leg: Edema (1 + pitting lower legs and posterior upper legs) present.  Skin:    General: Skin is warm and dry.     Findings: No rash.  Neurological:     Mental Status: She is alert. Mental status is at baseline.  Psychiatric:        Mood and Affect: Mood normal.        Behavior: Behavior normal.            Assessment & Plan:    See Problem List for Assessment and Plan of chronic medical problems.

## 2022-07-08 ENCOUNTER — Ambulatory Visit (INDEPENDENT_AMBULATORY_CARE_PROVIDER_SITE_OTHER): Payer: BC Managed Care – PPO | Admitting: Internal Medicine

## 2022-07-08 VITALS — BP 120/78 | HR 66 | Temp 97.6°F | Ht 64.0 in | Wt 249.0 lb

## 2022-07-08 DIAGNOSIS — R600 Localized edema: Secondary | ICD-10-CM

## 2022-07-08 DIAGNOSIS — R6889 Other general symptoms and signs: Secondary | ICD-10-CM | POA: Diagnosis not present

## 2022-07-08 MED ORDER — FUROSEMIDE 20 MG PO TABS: 20.0000 mg | ORAL_TABLET | Freq: Every day | ORAL | 1 refills | Status: DC

## 2022-07-08 MED ORDER — POTASSIUM CHLORIDE CRYS ER 20 MEQ PO TBCR: 20.0000 meq | EXTENDED_RELEASE_TABLET | Freq: Every day | ORAL | 1 refills | Status: DC

## 2022-07-08 MED ORDER — HYDROCODONE-ACETAMINOPHEN 5-325 MG PO TABS: 1.0000 | ORAL_TABLET | Freq: Three times a day (TID) | ORAL | 0 refills | Status: DC | PRN

## 2022-07-08 NOTE — Patient Instructions (Addendum)
       Try to elevate your legs as much as possible.     Medications changes include :   lasix 20 mg daily and potassium 20 meq daily     Return for follow up as scheduled.

## 2022-07-08 NOTE — Assessment & Plan Note (Signed)
Chronic Worse  than her last visit She is sedentary and sleeping sitting in a chair with her legs down Encouraged her to try to elevate even a little bit, but it causes knee discomfort She will be getting a bed at some point, but needs to wait until her brother can do that for her and will also get a recliner which will make a huge difference-stress we want to concentrate on this  since we want to avoid medications Start Lasix 20 mg daily and potassium 20 mill equivalents daily May need to increase Lasix further, but will reevaluate at her visit next month Low-sodium diet Encouraged more regular activity

## 2022-08-28 ENCOUNTER — Encounter: Payer: Self-pay | Admitting: Internal Medicine

## 2022-08-28 NOTE — Progress Notes (Unsigned)
Subjective:    Patient ID: Debra Watkins, female    DOB: 07-May-1956, 67 y.o.   MRN: [id number]     HPI Debra Watkins is here for follow up of her chronic medical problems, including htn, leg edema (from sleeping chair), DM, hypothyroid, carotid artery dz, lumbar spinal stenosis, anxiety, vit d def  Now using a lift chair - elevating legs more.  She is still sleeping in the chair, but at least now her legs are elevated  Getting dental work done.    Mom died in 01/22/2023, dad died 34 yrs ago - she is going to do grief counseling [facility].   Needs to walk more - she walks just a little now.  She is trying to watch what she eats    Medications and allergies reviewed with patient and updated if appropriate.  Current Outpatient Medications on File Prior to Visit  Medication Sig Dispense Refill   albuterol (VENTOLIN HFA) 108 (90 Base) MCG/ACT inhaler Inhale 2 puffs into the lungs every 6 (six) hours as needed. 20.1 g 5   ALPRAZolam (XANAX) 0.5 MG tablet TAKE 1/2 TO 1 TABLET(0.25 TO 0.5 MG) BY MOUTH THREE TIMES DAILY AS NEEDED FOR ANXIETY 30 tablet 0   levothyroxine (SYNTHROID) 112 MCG tablet Take 1 tablet (112 mcg total) by mouth daily. 90 tablet 0   lidocaine (LIDODERM) 5 % Place 1 patch onto the skin daily as needed. Remove & Discard patch within 12 hours or as directed by MD 15 patch 0   No current facility-administered medications on file prior to visit.     Review of Systems  Constitutional:  Negative for fever.  Respiratory:  Negative for cough, shortness of breath and wheezing.   Cardiovascular:  Positive for leg swelling. Negative for chest pain and palpitations.  Neurological:  Negative for light-headedness and headaches.       Objective:   Vitals:   08/30/22 0958  BP: 114/80  Pulse: 70  Temp: 97.9 F (36.6 C)  SpO2: 95%   BP Readings from Last 3 Encounters:  08/30/22 114/80  07/08/22 120/78  05/31/22 (!) 146/82   Wt Readings from Last 3 Encounters:   08/30/22 251 lb (113.9 kg)  07/08/22 249 lb (112.9 kg)  05/31/22 228 lb 3.2 oz (103.5 kg)   Body mass index is 43.08 kg/m.    Physical Exam Constitutional:      General: She is not in acute distress.    Appearance: Normal appearance.  HENT:     Head: Normocephalic and atraumatic.  Eyes:     Conjunctiva/sclera: Conjunctivae normal.  Cardiovascular:     Rate and Rhythm: Normal rate and regular rhythm.     Heart sounds: Normal heart sounds. No murmur heard. Pulmonary:     Effort: Pulmonary effort is normal. No respiratory distress.     Breath sounds: Normal breath sounds. No wheezing.  Musculoskeletal:     Cervical back: Neck supple.     Right lower leg: Edema (1+ edema with chronic skin changes lower anterior leg-no open wound) present.     Left lower leg: Edema (1+ edema with chronic skin changes lower anterior leg-no open wound) present.  Lymphadenopathy:     Cervical: No cervical adenopathy.  Skin:    General: Skin is warm and dry.     Findings: Erythema (Bilateral lower extremity legs secondary to swelling) present. No rash.  Neurological:     Mental Status: She is alert. Mental status is at baseline.  Psychiatric:        Mood and Affect: Mood normal.        Behavior: Behavior normal.        Lab Results  Component Value Date   WBC 6.1 05/31/2022   HGB 12.8 05/31/2022   HCT 39.0 05/31/2022   PLT 224.0 05/31/2022   GLUCOSE 127 (H) 08/30/2022   CHOL 182 08/30/2022   TRIG 104.0 08/30/2022   HDL 56.30 08/30/2022   LDLDIRECT 121.2 05/07/2012   LDLCALC 105 (H) 08/30/2022   ALT 12 08/30/2022   AST 16 08/30/2022   NA 138 08/30/2022   K 4.8 08/30/2022   CL 101 08/30/2022   CREATININE 0.77 08/30/2022   BUN 13 08/30/2022   CO2 30 08/30/2022   TSH 13.49 (H) 08/30/2022   HGBA1C 6.9 (H) 08/30/2022   MICROALBUR 30.6 (H) 11/19/2019     Assessment & Plan:    See Problem List for Assessment and Plan of chronic medical problems.

## 2022-08-28 NOTE — Patient Instructions (Addendum)
      Blood work was ordered.   The lab is on the first floor.    Medications changes include :   take an extra water pill 1-2 times a week as needed for swelling - double the potassium when you take an extra water pill.     Return in about 4 months (around 12/29/2022) for follow up.

## 2022-08-30 ENCOUNTER — Ambulatory Visit (INDEPENDENT_AMBULATORY_CARE_PROVIDER_SITE_OTHER): Payer: Medicare HMO | Admitting: Internal Medicine

## 2022-08-30 VITALS — BP 114/80 | HR 70 | Temp 97.9°F | Ht 64.0 in | Wt 251.0 lb

## 2022-08-30 DIAGNOSIS — I10 Essential (primary) hypertension: Secondary | ICD-10-CM | POA: Diagnosis not present

## 2022-08-30 DIAGNOSIS — M48061 Spinal stenosis, lumbar region without neurogenic claudication: Secondary | ICD-10-CM | POA: Diagnosis not present

## 2022-08-30 DIAGNOSIS — E559 Vitamin D deficiency, unspecified: Secondary | ICD-10-CM

## 2022-08-30 DIAGNOSIS — E1169 Type 2 diabetes mellitus with other specified complication: Secondary | ICD-10-CM

## 2022-08-30 DIAGNOSIS — F411 Generalized anxiety disorder: Secondary | ICD-10-CM | POA: Diagnosis not present

## 2022-08-30 DIAGNOSIS — Z794 Long term (current) use of insulin: Secondary | ICD-10-CM

## 2022-08-30 DIAGNOSIS — R600 Localized edema: Secondary | ICD-10-CM | POA: Diagnosis not present

## 2022-08-30 DIAGNOSIS — E890 Postprocedural hypothyroidism: Secondary | ICD-10-CM

## 2022-08-30 DIAGNOSIS — Z0289 Encounter for other administrative examinations: Secondary | ICD-10-CM | POA: Insufficient documentation

## 2022-08-30 LAB — COMPREHENSIVE METABOLIC PANEL
ALT: 12 U/L (ref 0–35)
AST: 16 U/L (ref 0–37)
Albumin: 4 g/dL (ref 3.5–5.2)
Alkaline Phosphatase: 95 U/L (ref 39–117)
BUN: 13 mg/dL (ref 6–23)
CO2: 30 mEq/L (ref 19–32)
Calcium: 9.3 mg/dL (ref 8.4–10.5)
Chloride: 101 mEq/L (ref 96–112)
Creatinine, Ser: 0.77 mg/dL (ref 0.40–1.20)
GFR: 80.74 mL/min (ref >60.00)
Glucose, Bld: 127 mg/dL — ABNORMAL HIGH (ref 70–99)
Potassium: 4.8 mEq/L (ref 3.5–5.1)
Sodium: 138 mEq/L (ref 135–145)
Total Bilirubin: 0.4 mg/dL (ref 0.2–1.2)
Total Protein: 7.9 g/dL (ref 6.0–8.3)

## 2022-08-30 LAB — LIPID PANEL
Cholesterol: 182 mg/dL (ref 0–200)
HDL: 56.3 mg/dL (ref >39.00)
LDL Cholesterol: 105 mg/dL — ABNORMAL HIGH (ref 0–99)
NonHDL: 125.91
Total CHOL/HDL Ratio: 3
Triglycerides: 104 mg/dL (ref 0.0–149.0)
VLDL: 20.8 mg/dL (ref 0.0–40.0)

## 2022-08-30 LAB — HEMOGLOBIN A1C: Hgb A1c MFr Bld: 6.9 % — ABNORMAL HIGH (ref 4.6–6.5)

## 2022-08-30 LAB — VITAMIN D 25 HYDROXY (VIT D DEFICIENCY, FRACTURES): VITD: 25.13 ng/mL — ABNORMAL LOW (ref 30.00–100.00)

## 2022-08-30 LAB — TSH: TSH: 13.49 u[IU]/mL — ABNORMAL HIGH (ref 0.35–5.50)

## 2022-08-30 MED ORDER — POTASSIUM CHLORIDE CRYS ER 20 MEQ PO TBCR: 20.0000 meq | EXTENDED_RELEASE_TABLET | Freq: Every day | ORAL | 1 refills | Status: DC

## 2022-08-30 MED ORDER — GABAPENTIN 300 MG PO CAPS: 300.0000 mg | ORAL_CAPSULE | Freq: Two times a day (BID) | ORAL | 1 refills | Status: DC

## 2022-08-30 MED ORDER — FUROSEMIDE 20 MG PO TABS: 20.0000 mg | ORAL_TABLET | Freq: Every day | ORAL | 1 refills | Status: DC

## 2022-08-30 MED ORDER — METOPROLOL TARTRATE 25 MG PO TABS: 12.5000 mg | ORAL_TABLET | Freq: Two times a day (BID) | ORAL | 1 refills | Status: DC

## 2022-08-30 MED ORDER — CITALOPRAM HYDROBROMIDE 20 MG PO TABS: 20.0000 mg | ORAL_TABLET | Freq: Every day | ORAL | 1 refills | Status: DC

## 2022-08-30 MED ORDER — HYDROCODONE-ACETAMINOPHEN 5-325 MG PO TABS: 1.0000 | ORAL_TABLET | Freq: Three times a day (TID) | ORAL | 0 refills | Status: DC | PRN

## 2022-08-30 NOTE — Assessment & Plan Note (Addendum)
Chronic Related to being sedentary and sleeping sitting up in a chair Improved since she is now sleeping reclined in her legs or not down all night Still has significant swelling Continue Lasix 20 mg daily and potassium 20 mill equivalents daily-advised once or twice a week to take an extra Lasix and an extra potassium at the same time-hopefully this will help bring down some of the fluid She will try to increase her activity-walking to see if that helps as well Stressed low-sodium diet

## 2022-08-30 NOTE — Assessment & Plan Note (Signed)
Chronic Taking vitamin D daily Check vitamin D level  

## 2022-08-30 NOTE — Assessment & Plan Note (Signed)
Chronic Controlled, Stable Continue citalopram 20 mg daily, alprazolam 0.25 mg - 0.5 mg 3 times daily as needed

## 2022-08-30 NOTE — Assessment & Plan Note (Signed)
Chronic Blood pressure well controlled CMP Continue metoprolol 12.5 mg twice daily 

## 2022-08-30 NOTE — Assessment & Plan Note (Signed)
Chronic   Lab Results  Component Value Date   HGBA1C 6.4 05/31/2022   Sugars controlled Check A1c, urine microalbumin today Continue lifestyle control Stressed regular exercise, diabetic diet

## 2022-08-30 NOTE — Assessment & Plan Note (Signed)
Chronic Very sedentary-she will work on increasing her activity She has been trying to watch what she eats and will continue to do that She knows the benefits of weight loss

## 2022-08-30 NOTE — Assessment & Plan Note (Signed)
Chronic  Clinically euthyroid Check tsh and will titrate med dose if needed Currently taking levothyroxine 112 mcg daily  

## 2022-08-30 NOTE — Assessment & Plan Note (Addendum)
Chronic Pain is intermittent Pain is controlled Continue hydrocodone-acetaminophen 5-325 mg 1 pill every 8 hours as needed-typically takes 1 pill twice a day Continue gabapentin 300mg  bid Pain contract and UDS today Encouraged more activity-will try to start walking more

## 2022-09-01 LAB — DRUG MONITOR, OPIATES,W/CONF, URINE
Codeine: NEGATIVE ng/mL (ref <50)
Hydrocodone: 542 ng/mL — ABNORMAL HIGH (ref <50)
Hydromorphone: 68 ng/mL — ABNORMAL HIGH (ref <50)
Morphine: NEGATIVE ng/mL (ref <50)
Norhydrocodone: 1715 ng/mL — ABNORMAL HIGH (ref <50)
Opiates: POSITIVE ng/mL — AB (ref <100)

## 2022-09-01 LAB — PRESCRIBED DRUGS,MEDMATCH(R)

## 2022-09-01 LAB — DM TEMPLATE

## 2022-09-01 LAB — DRUG MONITOR,BARBITURATE,W/CONF, URINE: Barbiturates: NEGATIVE ng/mL (ref <300)

## 2022-09-01 LAB — DRUG MONITOR, TRAMADOL,QN, URINE
Desmethyltramadol: NEGATIVE ng/mL (ref <100)
Tramadol: NEGATIVE ng/mL (ref <100)

## 2022-09-01 LAB — DRUG MONITOR, COCAINEMETAB, W/CONF, URINE: Cocaine Metabolite: NEGATIVE ng/mL (ref <150)

## 2022-09-01 LAB — DRUG MONITOR, OXYCODONE,W/CONF, URINE: Oxycodone: NEGATIVE ng/mL (ref <100)

## 2022-09-01 LAB — DRUG MONITOR, BENZO,W/CONF, URINE: Benzodiazepines: NEGATIVE ng/mL (ref <100)

## 2022-09-01 LAB — DRUG MONITOR,AMPHETAMINE,W/CONF, URINE: Amphetamines: NEGATIVE ng/mL (ref <500)

## 2022-09-02 ENCOUNTER — Other Ambulatory Visit: Payer: Self-pay | Admitting: Internal Medicine

## 2022-09-03 ENCOUNTER — Other Ambulatory Visit: Payer: Self-pay | Admitting: Internal Medicine

## 2022-09-03 MED ORDER — LEVOTHYROXINE SODIUM 112 MCG PO TABS: ORAL_TABLET | ORAL | 3 refills | Status: DC

## 2022-09-03 NOTE — Addendum Note (Signed)
Addended by: Binnie Rail on: 09/03/2022 05:39 PM   Modules accepted: Orders

## 2022-10-13 ENCOUNTER — Telehealth: Payer: Self-pay | Admitting: Internal Medicine

## 2022-10-13 MED ORDER — HYDROCODONE-ACETAMINOPHEN 5-325 MG PO TABS: 1.0000 | ORAL_TABLET | Freq: Three times a day (TID) | ORAL | 0 refills | Status: DC | PRN

## 2022-10-13 NOTE — Telephone Encounter (Signed)
Prescription Request  10/13/2022  LOV: 08/30/2022  What is the name of the medication or equipment? Hydrocodone  Have you contacted your pharmacy to request a refill? No   Which pharmacy would you like this sent to?  [facility], [place] AT [address] [place] [address] Phone: [phone] Fax: [phone]     Patient notified that their request is being sent to the clinical staff for review and that they should receive a response within 2 business days.   Please advise at Mobile [phone] (mobile)

## 2022-10-13 NOTE — Telephone Encounter (Signed)
sent 

## 2022-11-08 IMAGING — XA Imaging study
2 series · 2 of 2 positions shown · non-contrast
Comparison: none

CLINICAL DATA: Spinal stenosis of the lumbar region. Right-sided
radiculopathy.

[Series 1: ortho standard · 1 of 1 slices shown (1 of 2)]
[im 1/1]
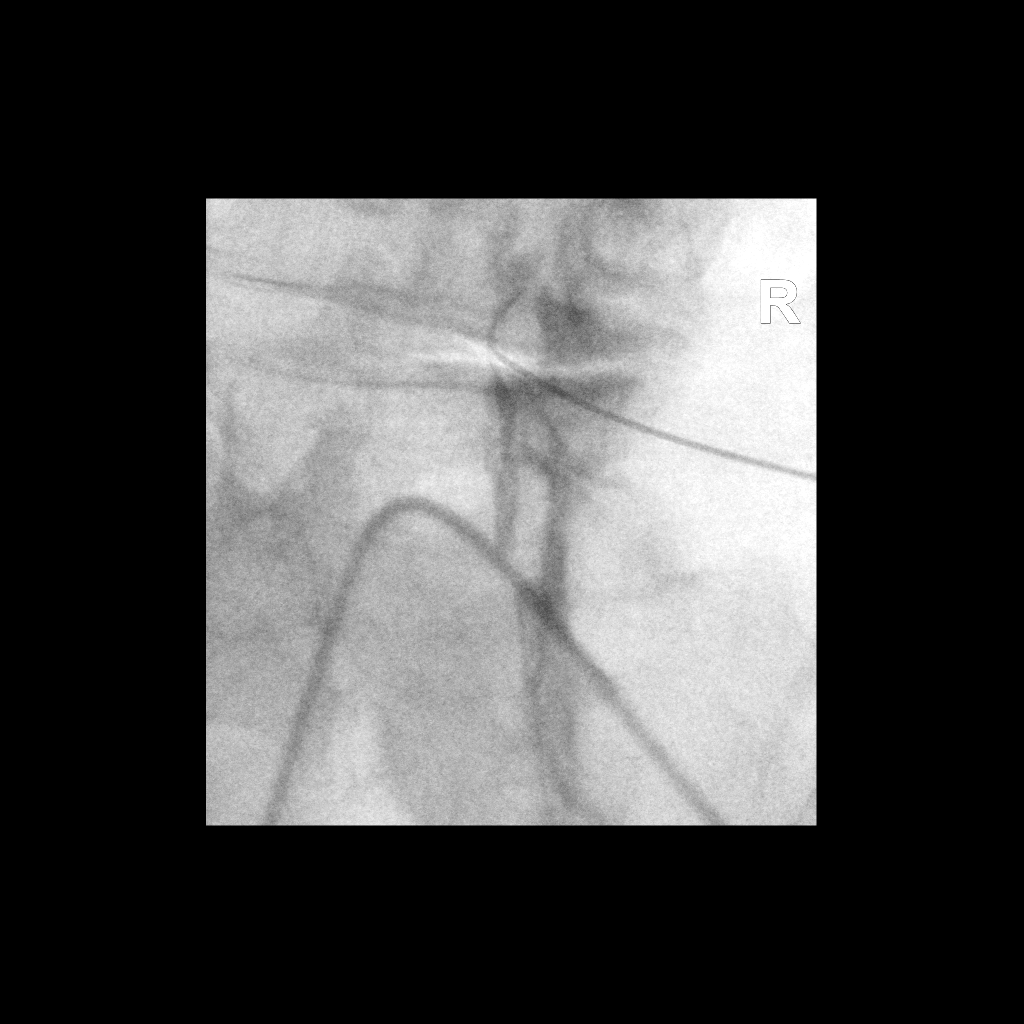

[Series 2: ortho standard · 1 of 1 slices shown (2 of 2)]
[im 1/1]
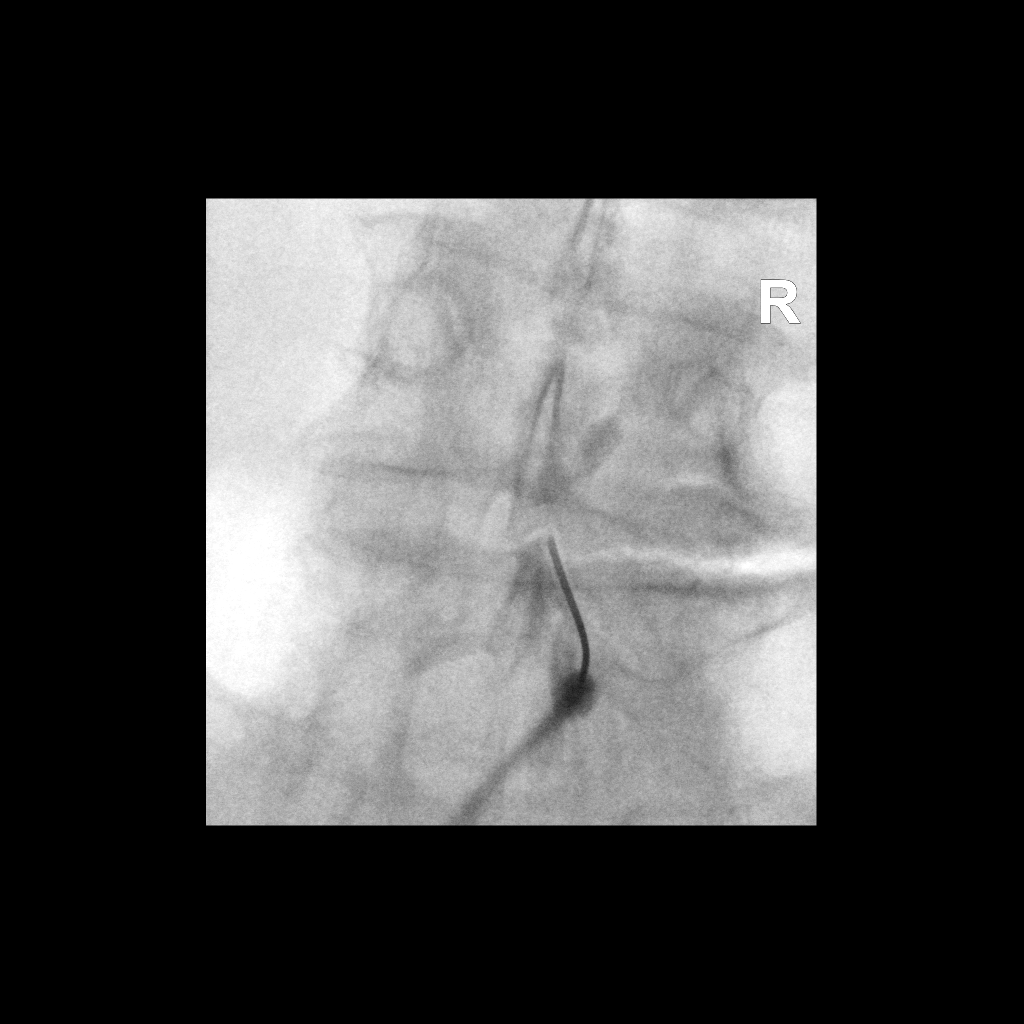

[2 of 2 positions shown; findings below may reference images not displayed]

FLUOROSCOPY TIME:  Radiation Exposure Index (as provided by the
fluoroscopic device): 85.80 uGy*m2

PROCEDURE:
The procedure, risks, benefits, and alternatives were explained to
the patient. Questions regarding the procedure were encouraged and
answered. The patient understands and consents to the procedure.

LUMBAR EPIDURAL INJECTION:

An interlaminar approach was performed on right at L4-5. The
overlying skin was cleansed and anesthetized. A 20 gauge epidural
needle was advanced using loss-of-resistance technique.

DIAGNOSTIC EPIDURAL INJECTION:

Injection of Isovue-M 200 shows a good epidural pattern with spread
above and below the level of needle placement, primarily on the
right no vascular opacification is seen.

THERAPEUTIC EPIDURAL INJECTION:

80 mg of Depo-Medrol mixed with 2 mL 1% lidocaine were instilled.
The procedure was well-tolerated, and the patient was discharged
thirty minutes following the injection in good condition.

COMPLICATIONS:
None
IMPRESSION: Technically successful epidural injection on the right L4-5 # 1

## 2022-11-11 ENCOUNTER — Telehealth: Payer: Self-pay | Admitting: Internal Medicine

## 2022-11-11 ENCOUNTER — Other Ambulatory Visit: Payer: Self-pay

## 2022-11-11 MED ORDER — GABAPENTIN 300 MG PO CAPS: 300.0000 mg | ORAL_CAPSULE | Freq: Two times a day (BID) | ORAL | 1 refills | Status: DC

## 2022-11-11 NOTE — Telephone Encounter (Signed)
Called Pt and was unable to leave voicemail if she calls back, please let her know that she has another refill at the pharmacy already. She just needs to call the pharmacy to get the prescription filled.

## 2022-11-11 NOTE — Telephone Encounter (Signed)
Prescription Request  11/11/2022  LOV: 08/30/2022  What is the name of the medication or equipment? gabapentin (NEURONTIN) 300 MG capsule   Have you contacted your pharmacy to request a refill? No   Which pharmacy would you like this sent to?  [facility] #[id number] [place], [place] - [address] AT [facility] OF [facility] [address] & [facility] [address] [place] [address] Phone: [phone] Fax: [phone]     Patient notified that their request is being sent to the clinical staff for review and that they should receive a response within 2 business days.   Please advise at Mobile [phone] (mobile)

## 2022-11-11 NOTE — Telephone Encounter (Signed)
Script sent in

## 2022-11-28 ENCOUNTER — Telehealth: Payer: Self-pay | Admitting: Internal Medicine

## 2022-11-28 MED ORDER — HYDROCODONE-ACETAMINOPHEN 5-325 MG PO TABS: 1.0000 | ORAL_TABLET | Freq: Three times a day (TID) | ORAL | 0 refills | Status: DC | PRN

## 2022-11-28 NOTE — Telephone Encounter (Signed)
Prescription Request  11/28/2022  LOV: 08/30/2022  What is the name of the medication or equipment? HYDROcodone-acetaminophen (NORCO/VICODIN) 5-325 MG tablet   Have you contacted your pharmacy to request a refill? Yes   Which pharmacy would you like this sent to?  [facility] #[id number] [place], [place] - [address] AT [facility] OF [facility] [address] & [facility] [address] [place] [address] Phone: [phone] Fax: [phone]    Patient notified that their request is being sent to the clinical staff for review and that they should receive a response within 2 business days.   Please advise at Mobile [phone] (mobile)   Next OV is 12/29/22

## 2022-12-23 DIAGNOSIS — R6889 Other general symptoms and signs: Secondary | ICD-10-CM | POA: Diagnosis not present

## 2022-12-28 ENCOUNTER — Encounter: Payer: Self-pay | Admitting: Internal Medicine

## 2022-12-28 NOTE — Progress Notes (Unsigned)
Subjective:    Patient ID: Debra Watkins, female    DOB: 1955-08-04, 67 y.o.   MRN: [id number]     HPI Nassim is here for follow up of her chronic medical problems.  Doing ok - having dental work done.  Medications and allergies reviewed with patient and updated if appropriate.  Current Outpatient Medications on File Prior to Visit  Medication Sig Dispense Refill   albuterol (VENTOLIN HFA) 108 (90 Base) MCG/ACT inhaler Inhale 2 puffs into the lungs every 6 (six) hours as needed. 20.1 g 5   ALPRAZolam (XANAX) 0.5 MG tablet TAKE 1/2 TO 1 TABLET(0.25 TO 0.5 MG) BY MOUTH THREE TIMES DAILY AS NEEDED FOR ANXIETY 30 tablet 0   citalopram (CELEXA) 20 MG tablet Take 1 tablet (20 mg total) by mouth daily. 90 tablet 1   clindamycin (CLEOCIN) 150 MG capsule Take 150 mg by mouth 4 (four) times daily.     furosemide (LASIX) 20 MG tablet Take 1 tablet (20 mg total) by mouth daily. Take an extra pill 1-2 times a week if needed for swelling 100 tablet 1   gabapentin (NEURONTIN) 300 MG capsule Take 1 capsule (300 mg total) by mouth 2 (two) times daily. 180 capsule 1   HYDROcodone-acetaminophen (NORCO/VICODIN) 5-325 MG tablet Take 1 tablet by mouth every 8 (eight) hours as needed. 90 tablet 0   ibuprofen (ADVIL) 800 MG tablet Take 800 mg by mouth 4 (four) times daily.     levothyroxine (SYNTHROID) 112 MCG tablet TAKE 1 TABLET(112 MCG) BY MOUTH DAILY 6 days a week and take 2 tablets p.o. 1 day a week 102 tablet 3   lidocaine (LIDODERM) 5 % Place 1 patch onto the skin daily as needed. Remove & Discard patch within 12 hours or as directed by MD 15 patch 0   metoprolol tartrate (LOPRESSOR) 25 MG tablet Take 0.5 tablets (12.5 mg total) by mouth 2 (two) times daily. 90 tablet 1   potassium chloride SA (KLOR-CON M) 20 MEQ tablet Take 1 tablet (20 mEq total) by mouth daily. Take an extra pill when you take an extra water pill 100 tablet 1   No current facility-administered medications on file prior  to visit.     Review of Systems  Constitutional:  Negative for fever.  Respiratory:  Negative for cough, shortness of breath and wheezing.   Cardiovascular:  Positive for leg swelling. Negative for chest pain and palpitations.  Neurological:  Negative for light-headedness and headaches.       Objective:   Vitals:   12/29/22 1001  BP: (!) 142/80  Pulse: 65  Temp: 97.9 F (36.6 C)  SpO2: 95%   BP Readings from Last 3 Encounters:  12/29/22 (!) 142/80  08/30/22 114/80  07/08/22 120/78   Wt Readings from Last 3 Encounters:  12/29/22 240 lb (108.9 kg)  08/30/22 251 lb (113.9 kg)  07/08/22 249 lb (112.9 kg)   Body mass index is 41.2 kg/m.    Physical Exam Constitutional:      General: She is not in acute distress.    Appearance: Normal appearance.  HENT:     Head: Normocephalic and atraumatic.  Eyes:     Conjunctiva/sclera: Conjunctivae normal.  Cardiovascular:     Rate and Rhythm: Normal rate and regular rhythm.     Heart sounds: Normal heart sounds.  Pulmonary:     Effort: Pulmonary effort is normal. No respiratory distress.     Breath sounds: Normal breath sounds.  No wheezing.  Musculoskeletal:     Cervical back: Neck supple.     Right lower leg: Edema (trace) present.     Left lower leg: Edema (trace) present.  Lymphadenopathy:     Cervical: No cervical adenopathy.  Skin:    General: Skin is warm and dry.     Findings: No rash.     Comments: Chronic erythema in b/l LE from edema  Neurological:     Mental Status: She is alert. Mental status is at baseline.  Psychiatric:        Mood and Affect: Mood normal.        Behavior: Behavior normal.        Lab Results  Component Value Date   WBC 6.1 05/31/2022   HGB 12.8 05/31/2022   HCT 39.0 05/31/2022   PLT 224.0 05/31/2022   GLUCOSE 127 (H) 08/30/2022   CHOL 182 08/30/2022   TRIG 104.0 08/30/2022   HDL 56.30 08/30/2022   LDLDIRECT 121.2 05/07/2012   LDLCALC 105 (H) 08/30/2022   ALT 12 08/30/2022    AST 16 08/30/2022   NA 138 08/30/2022   K 4.8 08/30/2022   CL 101 08/30/2022   CREATININE 0.77 08/30/2022   BUN 13 08/30/2022   CO2 30 08/30/2022   TSH 13.49 (H) 08/30/2022   HGBA1C 6.9 (H) 08/30/2022   MICROALBUR 30.6 (H) 11/19/2019     Assessment & Plan:    See Problem List for Assessment and Plan of chronic medical problems.

## 2022-12-28 NOTE — Patient Instructions (Addendum)
      Blood work was ordered.   The lab is on the first floor.    Medications changes include :   none        Return in about 4 months (around 05/01/2023) for Physical Exam.

## 2022-12-29 ENCOUNTER — Ambulatory Visit (INDEPENDENT_AMBULATORY_CARE_PROVIDER_SITE_OTHER): Payer: Medicare HMO | Admitting: Internal Medicine

## 2022-12-29 VITALS — BP 142/80 | HR 65 | Temp 97.9°F | Ht 64.0 in | Wt 240.0 lb

## 2022-12-29 DIAGNOSIS — M48061 Spinal stenosis, lumbar region without neurogenic claudication: Secondary | ICD-10-CM

## 2022-12-29 DIAGNOSIS — E890 Postprocedural hypothyroidism: Secondary | ICD-10-CM

## 2022-12-29 DIAGNOSIS — E1169 Type 2 diabetes mellitus with other specified complication: Secondary | ICD-10-CM | POA: Diagnosis not present

## 2022-12-29 DIAGNOSIS — R600 Localized edema: Secondary | ICD-10-CM | POA: Diagnosis not present

## 2022-12-29 DIAGNOSIS — I10 Essential (primary) hypertension: Secondary | ICD-10-CM

## 2022-12-29 DIAGNOSIS — Z794 Long term (current) use of insulin: Secondary | ICD-10-CM | POA: Diagnosis not present

## 2022-12-29 DIAGNOSIS — F411 Generalized anxiety disorder: Secondary | ICD-10-CM | POA: Diagnosis not present

## 2022-12-29 LAB — COMPREHENSIVE METABOLIC PANEL
ALT: 10 U/L (ref 0–35)
AST: 17 U/L (ref 0–37)
Albumin: 4.1 g/dL (ref 3.5–5.2)
Alkaline Phosphatase: 73 U/L (ref 39–117)
BUN: 14 mg/dL (ref 6–23)
CO2: 27 mEq/L (ref 19–32)
Calcium: 9.2 mg/dL (ref 8.4–10.5)
Chloride: 102 mEq/L (ref 96–112)
Creatinine, Ser: 1.06 mg/dL (ref 0.40–1.20)
GFR: 54.89 mL/min — ABNORMAL LOW (ref >60.00)
Glucose, Bld: 123 mg/dL — ABNORMAL HIGH (ref 70–99)
Potassium: 4.4 mEq/L (ref 3.5–5.1)
Sodium: 138 mEq/L (ref 135–145)
Total Bilirubin: 0.4 mg/dL (ref 0.2–1.2)
Total Protein: 7.9 g/dL (ref 6.0–8.3)

## 2022-12-29 LAB — LIPID PANEL
Cholesterol: 170 mg/dL (ref 0–200)
HDL: 45.5 mg/dL (ref >39.00)
LDL Cholesterol: 107 mg/dL — ABNORMAL HIGH (ref 0–99)
NonHDL: 124.72
Total CHOL/HDL Ratio: 4
Triglycerides: 89 mg/dL (ref 0.0–149.0)
VLDL: 17.8 mg/dL (ref 0.0–40.0)

## 2022-12-29 LAB — TSH: TSH: 4.04 u[IU]/mL (ref 0.35–5.50)

## 2022-12-29 LAB — HEMOGLOBIN A1C: Hgb A1c MFr Bld: 6.8 % — ABNORMAL HIGH (ref 4.6–6.5)

## 2022-12-29 LAB — MICROALBUMIN / CREATININE URINE RATIO
Creatinine,U: 141.6 mg/dL
Microalb Creat Ratio: 1.5 mg/g (ref 0.0–30.0)
Microalb, Ur: 2.1 mg/dL — ABNORMAL HIGH (ref 0.0–1.9)

## 2022-12-29 NOTE — Assessment & Plan Note (Addendum)
Chronic Pain is intermittent Pain is controlled Continue hydrocodone-acetaminophen 5-325 mg 1 pill every 8 hours as needed-typically takes 1 pill twice a day Continue gabapentin 300mg  bid-2025 Pain contract and UDS up-to-date-1/ Encouraged more activity

## 2022-12-29 NOTE — Assessment & Plan Note (Signed)
Chronic Controlled, Stable Continue citalopram 20 mg daily, alprazolam 0.25 mg - 0.5 mg 3 times daily as needed 

## 2022-12-29 NOTE — Assessment & Plan Note (Signed)
Chronic Very sedentary-she will work on increasing her activity She has been trying to watch what she eats and will continue to do that She knows the benefits of weight loss 

## 2022-12-29 NOTE — Assessment & Plan Note (Addendum)
Chronic Sleeping with legs elevated in recliner Still has mild swelling - better Continue Lasix 20 mg daily and potassium 20 meq daily She will try to increase her activity-walking more Stressed low-sodium diet

## 2022-12-29 NOTE — Assessment & Plan Note (Signed)
Chronic Blood pressure well controlled CMP Continue metoprolol 12.5 mg twice daily 

## 2022-12-29 NOTE — Assessment & Plan Note (Signed)
Chronic   Lab Results  Component Value Date   HGBA1C 6.9 (H) 08/30/2022   Sugars controlled Check A1c, urine microalbumin today Continue lifestyle control Stressed regular exercise, diabetic diet

## 2022-12-29 NOTE — Assessment & Plan Note (Signed)
Chronic  Clinically euthyroid Check tsh and will titrate med dose if needed-due for recheck Currently taking levothyroxine 112 mcg daily 6 days a week and 224 mcg 1 day a week

## 2023-01-10 ENCOUNTER — Other Ambulatory Visit: Payer: Self-pay

## 2023-01-10 ENCOUNTER — Other Ambulatory Visit: Payer: Self-pay | Admitting: Internal Medicine

## 2023-01-10 MED ORDER — HYDROCODONE-ACETAMINOPHEN 5-325 MG PO TABS: 1.0000 | ORAL_TABLET | Freq: Three times a day (TID) | ORAL | 0 refills | Status: DC | PRN

## 2023-01-10 MED ORDER — GABAPENTIN 300 MG PO CAPS: 300.0000 mg | ORAL_CAPSULE | Freq: Two times a day (BID) | ORAL | 1 refills | Status: DC

## 2023-01-10 MED ORDER — FUROSEMIDE 20 MG PO TABS: 20.0000 mg | ORAL_TABLET | Freq: Every day | ORAL | 1 refills | Status: DC

## 2023-01-10 NOTE — Telephone Encounter (Signed)
Prescription Request  01/10/2023  LOV: 12/29/2022  What is the name of the medication or equipment?  gabapentin (NEURONTIN) 300 MG capsule  furosemide (LASIX) 20 MG tablet  HYDROcodone-acetaminophen (NORCO/VICODIN) 5-325 MG tablet  Have you contacted your pharmacy to request a refill? No   Which pharmacy would you like this sent to?     Patient notified t [facility] #[id number] [place], [place] - [address] AT [facility] OF [facility] [address] & [facility] [address] [place] [address] [place] [address] Phone: [phone] Fax: [phone]  hat their request is being sent to the clinical staff for review and that they should receive a response within 2 business days.   Please advise at Mobile [phone] (mobile)

## 2023-02-21 ENCOUNTER — Telehealth: Payer: Self-pay | Admitting: Internal Medicine

## 2023-02-21 NOTE — Telephone Encounter (Signed)
MD is out of the office pls advise on refill../lmb 

## 2023-02-21 NOTE — Telephone Encounter (Signed)
Prescription Request  02/21/2023  LOV: 12/29/2022  What is the name of the medication or equipment? hydrocodone  Have you contacted your pharmacy to request a refill? Yes   Which pharmacy would you like this sent to?  [facility] #[id number] [place], [place] - [address] AT [facility] OF [facility] [address] & [facility] [address] [place] [address] Phone: [phone] Fax: [phone]     Patient notified that their request is being sent to the clinical staff for review and that they should receive a response within 2 business days.   Please advise at Mobile [phone] (mobile)

## 2023-02-22 MED ORDER — HYDROCODONE-ACETAMINOPHEN 5-325 MG PO TABS: 1.0000 | ORAL_TABLET | Freq: Three times a day (TID) | ORAL | 0 refills | Status: DC | PRN

## 2023-02-22 NOTE — Telephone Encounter (Signed)
Notified pt rx sent to [facility].../lmb 

## 2023-04-17 ENCOUNTER — Telehealth: Payer: Self-pay | Admitting: Internal Medicine

## 2023-04-17 MED ORDER — HYDROCODONE-ACETAMINOPHEN 5-325 MG PO TABS: 1.0000 | ORAL_TABLET | Freq: Three times a day (TID) | ORAL | 0 refills | Status: DC | PRN

## 2023-04-17 NOTE — Telephone Encounter (Signed)
Patient's next OV is 05/01/2023.   Prescription Request  04/17/2023  LOV: 12/29/2022  What is the name of the medication or equipment? HYDROcodone-acetaminophen (NORCO/VICODIN) 5-325 MG tablet   Have you contacted your pharmacy to request a refill? No   Which pharmacy would you like this sent to?  [facility] #[id number] [place],  - [address] AT [facility] OF [facility] [address] & [facility] [address] [place] [address] Phone: [phone] Fax: [phone]    Patient notified that their request is being sent to the clinical staff for review and that they should receive a response within 2 business days.   Please advise at Mobile [phone] (mobile)

## 2023-04-30 ENCOUNTER — Encounter: Payer: Self-pay | Admitting: Internal Medicine

## 2023-04-30 NOTE — Patient Instructions (Addendum)
     Flu immunization administered today.      Blood work was ordered.   The lab is on the first floor.    Medications changes include :   start crestor 5 mg three times a week     Return in about 4 months (around 08/31/2023) for Physical Exam.

## 2023-04-30 NOTE — Progress Notes (Unsigned)
Subjective:    Patient ID: Debra Watkins, female    DOB: 1956/07/28, 67 y.o.   MRN: [id number]     HPI Onesty is here for follow up of her chronic medical problems.  Doing well - no concerns.   Medications and allergies reviewed with patient and updated if appropriate.  Current Outpatient Medications on File Prior to Visit  Medication Sig Dispense Refill   albuterol (VENTOLIN HFA) 108 (90 Base) MCG/ACT inhaler Inhale 2 puffs into the lungs every 6 (six) hours as needed. 20.1 g 5   ALPRAZolam (XANAX) 0.5 MG tablet TAKE 1/2 TO 1 TABLET(0.25 TO 0.5 MG) BY MOUTH THREE TIMES DAILY AS NEEDED FOR ANXIETY 30 tablet 0   citalopram (CELEXA) 20 MG tablet Take 1 tablet (20 mg total) by mouth daily. 90 tablet 1   clindamycin (CLEOCIN) 150 MG capsule Take 150 mg by mouth 4 (four) times daily.     furosemide (LASIX) 20 MG tablet Take 1 tablet (20 mg total) by mouth daily. Take an extra pill 1-2 times a week if needed for swelling 100 tablet 1   gabapentin (NEURONTIN) 300 MG capsule Take 1 capsule (300 mg total) by mouth 2 (two) times daily. 180 capsule 1   HYDROcodone-acetaminophen (NORCO/VICODIN) 5-325 MG tablet Take 1 tablet by mouth every 8 (eight) hours as needed. 90 tablet 0   levothyroxine (SYNTHROID) 112 MCG tablet TAKE 1 TABLET(112 MCG) BY MOUTH DAILY 6 days a week and take 2 tablets p.o. 1 day a week 102 tablet 3   lidocaine (LIDODERM) 5 % Place 1 patch onto the skin daily as needed. Remove & Discard patch within 12 hours or as directed by MD 15 patch 0   metoprolol tartrate (LOPRESSOR) 25 MG tablet Take 0.5 tablets (12.5 mg total) by mouth 2 (two) times daily. 90 tablet 1   potassium chloride SA (KLOR-CON M) 20 MEQ tablet Take 1 tablet (20 mEq total) by mouth daily. Take an extra pill when you take an extra water pill 100 tablet 1   No current facility-administered medications on file prior to visit.     Review of Systems  Constitutional:  Negative for fever.   Respiratory:  Negative for cough, shortness of breath and wheezing.   Cardiovascular:  Positive for leg swelling. Negative for chest pain and palpitations.  Neurological:  Negative for light-headedness and headaches.       Objective:   Vitals:   05/01/23 1414  BP: 130/68  Pulse: 75  Temp: 98 F (36.7 C)  SpO2: 93%   BP Readings from Last 3 Encounters:  05/01/23 130/68  12/29/22 (!) 142/80  08/30/22 114/80   Wt Readings from Last 3 Encounters:  05/01/23 249 lb (112.9 kg)  12/29/22 240 lb (108.9 kg)  08/30/22 251 lb (113.9 kg)   Body mass index is 42.74 kg/m.    Physical Exam Constitutional:      General: She is not in acute distress.    Appearance: Normal appearance.  HENT:     Head: Normocephalic and atraumatic.  Eyes:     Conjunctiva/sclera: Conjunctivae normal.  Cardiovascular:     Rate and Rhythm: Normal rate and regular rhythm.     Heart sounds: Normal heart sounds.  Pulmonary:     Effort: Pulmonary effort is normal. No respiratory distress.     Breath sounds: Normal breath sounds. No wheezing.  Musculoskeletal:     Cervical back: Neck supple.     Right lower leg: Edema (  1+) present.     Left lower leg: Edema (1+) present.  Lymphadenopathy:     Cervical: No cervical adenopathy.  Skin:    General: Skin is warm and dry.     Findings: No rash.  Neurological:     Mental Status: She is alert. Mental status is at baseline.  Psychiatric:        Mood and Affect: Mood normal.        Behavior: Behavior normal.     Diabetic Foot Exam - Simple   Simple Foot Form Diabetic Foot exam was performed with the following findings: Yes 05/01/2023  2:54 PM  Visual Inspection No deformities, no ulcerations, no other skin breakdown bilaterally: Yes Sensation Testing Intact to touch and monofilament testing bilaterally: Yes Pulse Check Posterior Tibialis and Dorsalis pulse intact bilaterally: Yes Comments        Lab Results  Component Value Date   WBC 6.1  05/31/2022   HGB 12.8 05/31/2022   HCT 39.0 05/31/2022   PLT 224.0 05/31/2022   GLUCOSE 123 (H) 12/29/2022   CHOL 170 12/29/2022   TRIG 89.0 12/29/2022   HDL 45.50 12/29/2022   LDLDIRECT 121.2 05/07/2012   LDLCALC 107 (H) 12/29/2022   ALT 10 12/29/2022   AST 17 12/29/2022   NA 138 12/29/2022   K 4.4 12/29/2022   CL 102 12/29/2022   CREATININE 1.06 12/29/2022   BUN 14 12/29/2022   CO2 27 12/29/2022   TSH 4.04 12/29/2022   HGBA1C 6.8 (H) 12/29/2022   MICROALBUR 2.1 (H) 12/29/2022     Assessment & Plan:    See Problem List for Assessment and Plan of chronic medical problems.   Flu vaccine given

## 2023-05-01 ENCOUNTER — Ambulatory Visit (INDEPENDENT_AMBULATORY_CARE_PROVIDER_SITE_OTHER): Payer: Medicare HMO | Admitting: Internal Medicine

## 2023-05-01 VITALS — BP 130/68 | HR 75 | Temp 98.0°F | Ht 64.0 in | Wt 249.0 lb

## 2023-05-01 DIAGNOSIS — Z794 Long term (current) use of insulin: Secondary | ICD-10-CM

## 2023-05-01 DIAGNOSIS — E890 Postprocedural hypothyroidism: Secondary | ICD-10-CM

## 2023-05-01 DIAGNOSIS — E559 Vitamin D deficiency, unspecified: Secondary | ICD-10-CM

## 2023-05-01 DIAGNOSIS — E1169 Type 2 diabetes mellitus with other specified complication: Secondary | ICD-10-CM | POA: Diagnosis not present

## 2023-05-01 DIAGNOSIS — M48061 Spinal stenosis, lumbar region without neurogenic claudication: Secondary | ICD-10-CM

## 2023-05-01 DIAGNOSIS — R600 Localized edema: Secondary | ICD-10-CM

## 2023-05-01 DIAGNOSIS — I10 Essential (primary) hypertension: Secondary | ICD-10-CM | POA: Diagnosis not present

## 2023-05-01 DIAGNOSIS — Z23 Encounter for immunization: Secondary | ICD-10-CM | POA: Diagnosis not present

## 2023-05-01 DIAGNOSIS — F411 Generalized anxiety disorder: Secondary | ICD-10-CM | POA: Diagnosis not present

## 2023-05-01 LAB — CBC WITH DIFFERENTIAL/PLATELET
Basophils Absolute: 0.1 10*3/uL (ref 0.0–0.1)
Basophils Relative: 0.9 % (ref 0.0–3.0)
Eosinophils Absolute: 0.3 10*3/uL (ref 0.0–0.7)
Eosinophils Relative: 4.4 % (ref 0.0–5.0)
HCT: 40.4 % (ref 36.0–46.0)
Hemoglobin: 13.1 g/dL (ref 12.0–15.0)
Lymphocytes Relative: 29.2 % (ref 12.0–46.0)
Lymphs Abs: 1.7 10*3/uL (ref 0.7–4.0)
MCHC: 32.3 g/dL (ref 30.0–36.0)
MCV: 89.3 fL (ref 78.0–100.0)
Monocytes Absolute: 0.3 10*3/uL (ref 0.1–1.0)
Monocytes Relative: 5 % (ref 3.0–12.0)
Neutro Abs: 3.6 10*3/uL (ref 1.4–7.7)
Neutrophils Relative %: 60.5 % (ref 43.0–77.0)
Platelets: 195 10*3/uL (ref 150.0–400.0)
RBC: 4.52 Mil/uL (ref 3.87–5.11)
RDW: 14.1 % (ref 11.5–15.5)
WBC: 5.9 10*3/uL (ref 4.0–10.5)

## 2023-05-01 LAB — VITAMIN D 25 HYDROXY (VIT D DEFICIENCY, FRACTURES): VITD: 47.51 ng/mL (ref 30.00–100.00)

## 2023-05-01 LAB — TSH: TSH: 6.07 u[IU]/mL — ABNORMAL HIGH (ref 0.35–5.50)

## 2023-05-01 LAB — HEMOGLOBIN A1C: Hgb A1c MFr Bld: 7.2 % — ABNORMAL HIGH (ref 4.6–6.5)

## 2023-05-01 MED ORDER — ROSUVASTATIN CALCIUM 5 MG PO TABS: 5.0000 mg | ORAL_TABLET | ORAL | 3 refills | Status: DC

## 2023-05-01 MED ORDER — GABAPENTIN 300 MG PO CAPS: 300.0000 mg | ORAL_CAPSULE | Freq: Two times a day (BID) | ORAL | 1 refills | Status: DC

## 2023-05-01 NOTE — Assessment & Plan Note (Signed)
Chronic Blood pressure well controlled CMP Continue metoprolol 12.5 mg twice daily 

## 2023-05-01 NOTE — Assessment & Plan Note (Signed)
Chronic  Clinically euthyroid Check tsh and will titrate med dose if needed-due for recheck Currently taking levothyroxine 112 mcg daily 6 days a week and 224 mcg 1 day a week

## 2023-05-01 NOTE — Assessment & Plan Note (Signed)
Chronic Pain is intermittent Pain is controlled Continue hydrocodone-acetaminophen 5-325 mg 1 pill every 8 hours as needed-typically takes 1 pill twice a day Continue gabapentin 300mg  bid-2025 Pain contract and UDS up-to-date-08/2022 Encouraged more activity No refill needed at this time

## 2023-05-01 NOTE — Assessment & Plan Note (Signed)
Chronic Sleeping with legs elevated in recliner Has moderate swelling Has not been taking the Lasix because it makes her go to the bathroom too much Restart Lasix 20 mg daily and potassium 20 meq daily-discussed once swelling is better can take every other day to see if that can help maintain it In courage increasing her activity Stressed low-sodium diet

## 2023-05-01 NOTE — Assessment & Plan Note (Signed)
Chronic Taking vitamin D daily Check vitamin D level  

## 2023-05-01 NOTE — Assessment & Plan Note (Signed)
Chronic Controlled, Stable Continue citalopram 20 mg daily, alprazolam 0.25 mg - 0.5 mg 3 times daily as needed 

## 2023-05-01 NOTE — Assessment & Plan Note (Signed)
Chronic   Lab Results  Component Value Date   HGBA1C 6.8 (H) 12/29/2022   Sugars controlled Check A1c Continue lifestyle control Stressed regular exercise, diabetic diet

## 2023-05-01 NOTE — Assessment & Plan Note (Signed)
Chronic Very sedentary-she will work on increasing her activity She has been trying to watch what she eats and will continue to do that She knows the benefits of weight loss 

## 2023-05-02 LAB — LIPID PANEL
Cholesterol: 197 mg/dL (ref 0–200)
HDL: 67.8 mg/dL (ref >39.00)
LDL Cholesterol: 110 mg/dL — ABNORMAL HIGH (ref 0–99)
NonHDL: 129.48
Total CHOL/HDL Ratio: 3
Triglycerides: 99 mg/dL (ref 0.0–149.0)
VLDL: 19.8 mg/dL (ref 0.0–40.0)

## 2023-05-02 LAB — COMPREHENSIVE METABOLIC PANEL
ALT: 16 U/L (ref 0–35)
AST: 22 U/L (ref 0–37)
Albumin: 4.1 g/dL (ref 3.5–5.2)
Alkaline Phosphatase: 74 U/L (ref 39–117)
BUN: 10 mg/dL (ref 6–23)
CO2: 30 meq/L (ref 19–32)
Calcium: 9.4 mg/dL (ref 8.4–10.5)
Chloride: 100 meq/L (ref 96–112)
Creatinine, Ser: 0.81 mg/dL (ref 0.40–1.20)
GFR: 75.62 mL/min (ref >60.00)
Glucose, Bld: 115 mg/dL — ABNORMAL HIGH (ref 70–99)
Potassium: 4.5 meq/L (ref 3.5–5.1)
Sodium: 138 meq/L (ref 135–145)
Total Bilirubin: 0.5 mg/dL (ref 0.2–1.2)
Total Protein: 8 g/dL (ref 6.0–8.3)

## 2023-05-04 ENCOUNTER — Other Ambulatory Visit: Payer: Self-pay

## 2023-05-04 ENCOUNTER — Telehealth: Payer: Self-pay

## 2023-05-04 DIAGNOSIS — Z1211 Encounter for screening for malignant neoplasm of colon: Secondary | ICD-10-CM

## 2023-05-04 NOTE — Telephone Encounter (Signed)
Order placed today in Epic.

## 2023-05-12 ENCOUNTER — Other Ambulatory Visit: Payer: Self-pay

## 2023-05-12 ENCOUNTER — Telehealth: Payer: Self-pay | Admitting: Internal Medicine

## 2023-05-12 MED ORDER — FUROSEMIDE 20 MG PO TABS: 20.0000 mg | ORAL_TABLET | Freq: Every day | ORAL | 1 refills | Status: DC

## 2023-05-12 NOTE — Telephone Encounter (Signed)
Prescription Request  05/12/2023  LOV: 05/01/2023  What is the name of the medication or equipment?  furosemide (LASIX) 20 MG tablet  Have you contacted your pharmacy to request a refill? No   Which pharmacy would you like this sent to?  [facility] #[id number] [place], [place] - [address] AT [facility] OF [facility] [address] & [facility] [address] [place] [address] Phone: [phone] Fax: [phone]    Patient notified that their request is being sent to the clinical staff for review and that they should receive a response within 2 business days.   Please advise at Mobile [phone] (mobile)

## 2023-05-12 NOTE — Telephone Encounter (Signed)
Faxed today

## 2023-05-26 ENCOUNTER — Telehealth: Payer: Self-pay | Admitting: Internal Medicine

## 2023-05-26 MED ORDER — HYDROCODONE-ACETAMINOPHEN 5-325 MG PO TABS: 1.0000 | ORAL_TABLET | Freq: Three times a day (TID) | ORAL | 0 refills | Status: DC | PRN

## 2023-05-26 NOTE — Telephone Encounter (Signed)
Prescription Request   05/26/2023   LOV: 05/01/2023   What is the name of the medication or equipment?  HYDROcodone-acetaminophen (NORCO/VICODIN) 5-325 MG tablet   Have you contacted your pharmacy to request a refill? No    Which pharmacy would you like this sent to?  [facility] #[id number] [place], [place] - [address] AT [facility] OF [facility] [address] & [facility] [address] [place] [address] Phone: [phone] Fax: [phone]     Patient notified that their request is being sent to the clinical staff for review and that they should receive a response within 2 business days.    Please advise at Mobile [phone] (mobile)

## 2023-06-14 ENCOUNTER — Other Ambulatory Visit: Payer: Self-pay | Admitting: Internal Medicine

## 2023-07-14 ENCOUNTER — Telehealth: Payer: Self-pay | Admitting: Internal Medicine

## 2023-07-14 MED ORDER — HYDROCODONE-ACETAMINOPHEN 5-325 MG PO TABS: 1.0000 | ORAL_TABLET | Freq: Three times a day (TID) | ORAL | 0 refills | Status: DC | PRN

## 2023-07-14 NOTE — Telephone Encounter (Signed)
Next OV is 08/31/2023.   Prescription Request  07/14/2023  LOV: 05/01/2023  What is the name of the medication or equipment? HYDROcodone-acetaminophen (NORCO/VICODIN) 5-325 MG tablet   Have you contacted your pharmacy to request a refill? No   Which pharmacy would you like this sent to?  [facility] #[id number] [place], [place] - [address] AT [facility] OF [facility] [address] & [facility] [address] [place] [address] Phone: [phone] Fax: [phone]    Patient notified that their request is being sent to the clinical staff for review and that they should receive a response within 2 business days.   Please advise at Mobile [phone] (mobile)

## 2023-07-21 ENCOUNTER — Telehealth: Payer: Self-pay | Admitting: Pharmacy Technician

## 2023-07-21 ENCOUNTER — Other Ambulatory Visit (HOSPITAL_COMMUNITY): Payer: Self-pay

## 2023-07-24 NOTE — Telephone Encounter (Signed)
error 

## 2023-07-27 ENCOUNTER — Other Ambulatory Visit: Payer: Self-pay | Admitting: Internal Medicine

## 2023-08-07 ENCOUNTER — Ambulatory Visit: Payer: Medicare HMO | Admitting: Internal Medicine

## 2023-08-08 ENCOUNTER — Other Ambulatory Visit (HOSPITAL_COMMUNITY): Payer: Self-pay

## 2023-08-08 ENCOUNTER — Telehealth: Payer: Self-pay

## 2023-08-08 NOTE — Telephone Encounter (Signed)
 Pharmacy Patient Advocate Encounter  Received notification from [employer] that Prior Authorization for Hydrocodone /APAP 5/325mg  has been APPROVED from 08/02/23 to 07/31/24   PA #/Case ID/Reference #: [id number]  Let a voicemail at [facility] to notify of the approval

## 2023-08-08 NOTE — Telephone Encounter (Signed)
*  Primary  Pharmacy Patient Advocate Encounter   Received notification from CoverMyMeds that prior authorization for HYDROcodone -Acetaminophen  5-325MG  tablets  is required/requested.   Insurance verification completed.   The patient is insured through [place] .   Per test claim: PA required; PA submitted to above mentioned insurance via CoverMyMeds Key/confirmation #/EOC [id number] Status is pending

## 2023-08-18 ENCOUNTER — Other Ambulatory Visit: Payer: Self-pay | Admitting: Internal Medicine

## 2023-08-30 ENCOUNTER — Encounter: Payer: Self-pay | Admitting: Internal Medicine

## 2023-08-30 DIAGNOSIS — R52 Pain, unspecified: Secondary | ICD-10-CM | POA: Insufficient documentation

## 2023-08-30 NOTE — Patient Instructions (Addendum)
      Blood work was ordered.       Medications changes include :   None    A referral was ordered and someone will call you to schedule an appointment.     Return in about 6 months (around 02/28/2024) for Physical Exam.

## 2023-08-30 NOTE — Progress Notes (Unsigned)
Subjective:    Patient ID: Debra Watkins, female    DOB: 1956/03/06, 68 y.o.   MRN: [id number]     HPI Debra Watkins is here for follow up of her chronic medical problems.  She is doing ok overall.   She is not exercising.   Medications and allergies reviewed with patient and updated if appropriate.  Current Outpatient Medications on File Prior to Visit  Medication Sig Dispense Refill   albuterol (VENTOLIN HFA) 108 (90 Base) MCG/ACT inhaler Inhale 2 puffs into the lungs every 6 (six) hours as needed. 20.1 g 5   ALPRAZolam (XANAX) 0.5 MG tablet TAKE 1/2 TO 1 TABLET(0.25 TO 0.5 MG) BY MOUTH THREE TIMES DAILY AS NEEDED FOR ANXIETY 30 tablet 0   clindamycin (CLEOCIN) 150 MG capsule Take 150 mg by mouth 4 (four) times daily.     furosemide (LASIX) 20 MG tablet Take 1 tablet (20 mg total) by mouth daily. Take an extra pill 1-2 times a week if needed for swelling 100 tablet 1   levothyroxine (SYNTHROID) 112 MCG tablet TAKE 1 TABLET(112 MCG) BY MOUTH DAILY 6 days a week and take 2 tablets p.o. 1 day a week 102 tablet 3   lidocaine (LIDODERM) 5 % Place 1 patch onto the skin daily as needed. Remove & Discard patch within 12 hours or as directed by MD 15 patch 0   metoprolol tartrate (LOPRESSOR) 25 MG tablet TAKE 1/2 TABLET(12.5 MG) BY MOUTH TWICE DAILY 90 tablet 1   potassium chloride SA (KLOR-CON M) 20 MEQ tablet Take 1 tablet (20 mEq total) by mouth daily. Take an extra pill when you take an extra water pill 100 tablet 1   No current facility-administered medications on file prior to visit.     Review of Systems  Constitutional:  Negative for fever.  Respiratory:  Negative for cough, shortness of breath and wheezing.   Cardiovascular:  Positive for leg swelling. Negative for chest pain and palpitations.  Neurological:  Negative for light-headedness and headaches.       Objective:   Vitals:   08/31/23 1044  BP: 138/76  Pulse: 62  Temp: 98.1 F (36.7 C)  SpO2: 95%   BP  Readings from Last 3 Encounters:  08/31/23 138/76  05/01/23 130/68  12/29/22 (!) 142/80   Wt Readings from Last 3 Encounters:  08/31/23 248 lb (112.5 kg)  05/01/23 249 lb (112.9 kg)  12/29/22 240 lb (108.9 kg)   Body mass index is 42.57 kg/m.    Physical Exam Constitutional:      General: She is not in acute distress.    Appearance: Normal appearance.  HENT:     Head: Normocephalic and atraumatic.  Eyes:     Conjunctiva/sclera: Conjunctivae normal.  Cardiovascular:     Rate and Rhythm: Normal rate and regular rhythm.     Heart sounds: Normal heart sounds.  Pulmonary:     Effort: Pulmonary effort is normal. No respiratory distress.     Breath sounds: Normal breath sounds. No wheezing.  Musculoskeletal:     Cervical back: Neck supple.     Right lower leg: No edema.     Left lower leg: No edema.  Lymphadenopathy:     Cervical: No cervical adenopathy.  Skin:    General: Skin is warm and dry.     Findings: No rash.  Neurological:     Mental Status: She is alert. Mental status is at baseline.  Psychiatric:  Mood and Affect: Mood normal.        Behavior: Behavior normal.       Diabetic Foot Exam - Simple   Simple Foot Form Diabetic Foot exam was performed with the following findings: Yes 08/31/2023 11:27 AM  Visual Inspection No deformities, no ulcerations, no other skin breakdown bilaterally: Yes Sensation Testing Intact to touch and monofilament testing bilaterally: Yes Pulse Check Posterior Tibialis and Dorsalis pulse intact bilaterally: Yes Comments      Lab Results  Component Value Date   WBC 5.9 05/01/2023   HGB 13.1 05/01/2023   HCT 40.4 05/01/2023   PLT 195.0 05/01/2023   GLUCOSE 115 (H) 05/01/2023   CHOL 197 05/01/2023   TRIG 99.0 05/01/2023   HDL 67.80 05/01/2023   LDLDIRECT 121.2 05/07/2012   LDLCALC 110 (H) 05/01/2023   ALT 16 05/01/2023   AST 22 05/01/2023   NA 138 05/01/2023   K 4.5 05/01/2023   CL 100 05/01/2023   CREATININE 0.81  05/01/2023   BUN 10 05/01/2023   CO2 30 05/01/2023   TSH 6.07 (H) 05/01/2023   HGBA1C 7.2 (H) 05/01/2023   MICROALBUR 2.1 (H) 12/29/2022     Assessment & Plan:    See Problem List for Assessment and Plan of chronic medical problems.

## 2023-08-31 ENCOUNTER — Ambulatory Visit: Payer: Medicare PPO | Admitting: Internal Medicine

## 2023-08-31 ENCOUNTER — Ambulatory Visit (INDEPENDENT_AMBULATORY_CARE_PROVIDER_SITE_OTHER): Payer: Medicare PPO | Admitting: Internal Medicine

## 2023-08-31 VITALS — BP 138/76 | HR 62 | Temp 98.1°F | Ht 64.0 in | Wt 248.0 lb

## 2023-08-31 DIAGNOSIS — R52 Pain, unspecified: Secondary | ICD-10-CM | POA: Diagnosis not present

## 2023-08-31 DIAGNOSIS — E785 Hyperlipidemia, unspecified: Secondary | ICD-10-CM | POA: Insufficient documentation

## 2023-08-31 DIAGNOSIS — Z1211 Encounter for screening for malignant neoplasm of colon: Secondary | ICD-10-CM

## 2023-08-31 DIAGNOSIS — E890 Postprocedural hypothyroidism: Secondary | ICD-10-CM | POA: Diagnosis not present

## 2023-08-31 DIAGNOSIS — M48061 Spinal stenosis, lumbar region without neurogenic claudication: Secondary | ICD-10-CM

## 2023-08-31 DIAGNOSIS — Z794 Long term (current) use of insulin: Secondary | ICD-10-CM | POA: Diagnosis not present

## 2023-08-31 DIAGNOSIS — R600 Localized edema: Secondary | ICD-10-CM | POA: Diagnosis not present

## 2023-08-31 DIAGNOSIS — F411 Generalized anxiety disorder: Secondary | ICD-10-CM

## 2023-08-31 DIAGNOSIS — E1169 Type 2 diabetes mellitus with other specified complication: Secondary | ICD-10-CM

## 2023-08-31 DIAGNOSIS — I10 Essential (primary) hypertension: Secondary | ICD-10-CM

## 2023-08-31 DIAGNOSIS — E782 Mixed hyperlipidemia: Secondary | ICD-10-CM | POA: Diagnosis not present

## 2023-08-31 LAB — COMPREHENSIVE METABOLIC PANEL
ALT: 13 U/L (ref 0–35)
AST: 19 U/L (ref 0–37)
Albumin: 4 g/dL (ref 3.5–5.2)
Alkaline Phosphatase: 71 U/L (ref 39–117)
BUN: 10 mg/dL (ref 6–23)
CO2: 28 meq/L (ref 19–32)
Calcium: 9.4 mg/dL (ref 8.4–10.5)
Chloride: 103 meq/L (ref 96–112)
Creatinine, Ser: 0.75 mg/dL (ref 0.40–1.20)
GFR: 82.74 mL/min (ref >60.00)
Glucose, Bld: 121 mg/dL — ABNORMAL HIGH (ref 70–99)
Potassium: 4.3 meq/L (ref 3.5–5.1)
Sodium: 139 meq/L (ref 135–145)
Total Bilirubin: 0.5 mg/dL (ref 0.2–1.2)
Total Protein: 7.7 g/dL (ref 6.0–8.3)

## 2023-08-31 LAB — LIPID PANEL
Cholesterol: 141 mg/dL (ref 0–200)
HDL: 56.5 mg/dL (ref >39.00)
LDL Cholesterol: 68 mg/dL (ref 0–99)
NonHDL: 84.05
Total CHOL/HDL Ratio: 2
Triglycerides: 79 mg/dL (ref 0.0–149.0)
VLDL: 15.8 mg/dL (ref 0.0–40.0)

## 2023-08-31 LAB — TSH: TSH: 1.57 u[IU]/mL (ref 0.35–5.50)

## 2023-08-31 LAB — HEMOGLOBIN A1C: Hgb A1c MFr Bld: 7.4 % — ABNORMAL HIGH (ref 4.6–6.5)

## 2023-08-31 LAB — MICROALBUMIN / CREATININE URINE RATIO
Creatinine,U: 227.4 mg/dL
Microalb Creat Ratio: 0.8 mg/g (ref 0.0–30.0)
Microalb, Ur: 1.8 mg/dL (ref 0.0–1.9)

## 2023-08-31 MED ORDER — GABAPENTIN 300 MG PO CAPS: 300.0000 mg | ORAL_CAPSULE | Freq: Two times a day (BID) | ORAL | 1 refills | Status: DC

## 2023-08-31 MED ORDER — ROSUVASTATIN CALCIUM 5 MG PO TABS: 5.0000 mg | ORAL_TABLET | ORAL | 3 refills | Status: DC

## 2023-08-31 MED ORDER — HYDROCODONE-ACETAMINOPHEN 5-325 MG PO TABS: 1.0000 | ORAL_TABLET | Freq: Three times a day (TID) | ORAL | 0 refills | Status: DC | PRN

## 2023-08-31 MED ORDER — CITALOPRAM HYDROBROMIDE 20 MG PO TABS: 20.0000 mg | ORAL_TABLET | Freq: Every day | ORAL | 1 refills | Status: DC

## 2023-08-31 NOTE — Assessment & Plan Note (Signed)
Chronic  Clinically euthyroid Check tsh and will titrate med dose if needed-due for recheck Currently taking levothyroxine 112 mcg daily 6 days a week and 224 mcg 1 day a week

## 2023-08-31 NOTE — Assessment & Plan Note (Signed)
Chronic Check lipid panel  Continue crestor 5 mg three times per week  Regular exercise and healthy diet encouraged

## 2023-08-31 NOTE — Assessment & Plan Note (Signed)
Chronic   Lab Results  Component Value Date   HGBA1C 7.2 (H) 05/01/2023   Sugars not ideally Check A1c Discussed medication that would help better control her sugars and possibly help with weight loss Continue lifestyle control Stressed regular exercise, diabetic diet

## 2023-08-31 NOTE — Assessment & Plan Note (Signed)
Chronic Pain management for chronic back pain related to lumbar stenosis Currently taking Vicodin 5-325 mg 1 tablet every 8 hours as needed-often takes 2 tablets a day Also taking gabapentin 300 mg twice daily Pain control Pain medication is helping her to be more active and stressed increased activity [place] controlled substance database checked-she is taking medication appropriately Benefits of medication outweigh risks Continue current pain regimen Pain contract signed today, U tox today Follow-up in 4 months

## 2023-08-31 NOTE — Assessment & Plan Note (Signed)
Chronic Pain is intermittent Pain is controlled Continue hydrocodone-acetaminophen 5-325 mg 1 pill every 8 hours as needed-typically takes 1 pill twice a day Continue gabapentin 300 mg bid Pain contract and UDS due today Encouraged more activity [place] controlled substance database checked and she is taking medication appropriately Hydrocodone refilled today

## 2023-08-31 NOTE — Assessment & Plan Note (Signed)
Chronic Very sedentary-she will work on increasing her activity She has been trying to watch what she eats and will continue to do that She knows the benefits of weight loss

## 2023-08-31 NOTE — Assessment & Plan Note (Signed)
Chronic Sleeping with legs elevated in recliner Has moderate swelling Has not been taking the Lasix because it makes her go to the bathroom too much Continue Lasix 20 mg daily and potassium 20 meq daily-discussed can take every other day if swelling controlled Stressed increasing her activity Stressed low-sodium diet

## 2023-08-31 NOTE — Assessment & Plan Note (Signed)
Chronic Blood pressure well controlled CMP Continue metoprolol 12.5 mg twice daily

## 2023-08-31 NOTE — Assessment & Plan Note (Signed)
Chronic Controlled, Stable Continue citalopram 20 mg daily, alprazolam 0.25 mg - 0.5 mg 3 times daily as needed

## 2023-09-02 LAB — DRUG MONITOR, BENZO,W/CONF, URINE: Benzodiazepines: NEGATIVE ng/mL (ref <100)

## 2023-09-02 LAB — DRUG MONITOR, OPIATES,W/CONF, URINE
Codeine: NEGATIVE ng/mL (ref <50)
Hydrocodone: 1999 ng/mL — ABNORMAL HIGH (ref <50)
Hydromorphone: 145 ng/mL — ABNORMAL HIGH (ref <50)
Morphine: NEGATIVE ng/mL (ref <50)
Norhydrocodone: 3598 ng/mL — ABNORMAL HIGH (ref <50)
Opiates: POSITIVE ng/mL — AB (ref <100)

## 2023-09-02 LAB — PRESCRIBED DRUGS,MEDMATCH(R)

## 2023-09-02 LAB — DM TEMPLATE

## 2023-09-02 LAB — DRUG MONITOR, OXYCODONE,W/CONF, URINE: Oxycodone: NEGATIVE ng/mL (ref <100)

## 2023-09-02 LAB — DRUG MONITOR,AMPHETAMINE,W/CONF, URINE: Amphetamines: NEGATIVE ng/mL (ref <500)

## 2023-09-02 LAB — DRUG MONITOR, TRAMADOL,QN, URINE
Desmethyltramadol: NEGATIVE ng/mL (ref <100)
Tramadol: NEGATIVE ng/mL (ref <100)

## 2023-09-02 LAB — DRUG MONITOR, COCAINEMETAB, W/CONF, URINE: Cocaine Metabolite: NEGATIVE ng/mL (ref <150)

## 2023-09-02 LAB — DRUG MONITOR,BARBITURATE,W/CONF, URINE: Barbiturates: NEGATIVE ng/mL (ref <300)

## 2023-09-04 ENCOUNTER — Other Ambulatory Visit: Payer: Self-pay

## 2023-09-04 DIAGNOSIS — Z1211 Encounter for screening for malignant neoplasm of colon: Secondary | ICD-10-CM

## 2023-09-06 ENCOUNTER — Telehealth: Payer: Self-pay | Admitting: Internal Medicine

## 2023-09-06 NOTE — Telephone Encounter (Signed)
 Copied from CRM [id number]. Topic: General - Other >> Sep 06, 2023 10:29 AM Mercedes MATSU wrote: Reason for CRM: Patient called in stating that she forgot to ask Dr. Geofm for an updated/renewal on her handicap sticker. She is requesting that the handicap sticker is mailed to her. Please contact the patient.  ---  Please start handicapped pw for pt.

## 2023-09-06 NOTE — Telephone Encounter (Signed)
 Copied from CRM [id number]. Topic: General - Other >> Sep 06, 2023 10:29 AM Debra Watkins wrote: Reason for CRM: Patient called in stating that she forgot to ask Dr. Geofm for an updated/renewal on her handicap sticker. She is requesting that the handicap sticker is mailed to her. Please contact the patient.

## 2023-09-07 NOTE — Telephone Encounter (Signed)
 Form mailed out today with current lab results.

## 2023-09-09 ENCOUNTER — Other Ambulatory Visit: Payer: Self-pay | Admitting: Internal Medicine

## 2023-09-28 ENCOUNTER — Telehealth: Payer: Self-pay

## 2023-09-28 NOTE — Telephone Encounter (Signed)
 Copied from CRM [id number]. Topic: General - Other >> Sep 28, 2023  9:06 AM Kathryne Eriksson wrote: Reason for CRM: Message For Vira Browns >> Sep 28, 2023  9:09 AM Kathryne Eriksson wrote: Patient states she would rather do a pill once a day, rather than coming into the office for an injection. Patient also states this can be called into the [facility] #[id number] - [place], [place] - [address] AT [facility] OF [address] & [facility] [address]

## 2023-09-28 NOTE — Telephone Encounter (Signed)
 Unable to leave voicemail due to it not being setup.

## 2023-10-03 NOTE — Telephone Encounter (Signed)
 Copied from CRM [id number]. Topic: Clinical - Medication Question >> Oct 03, 2023 12:00 PM Denese Killings wrote: Reason for CRM: Patient is calling to checking the status of her request from 02/27. Patient states she would rather do a pill once a day, rather than coming into the office for an injection. Patient also states this can be called into the [facility] DR[facility] STORE  [place], [place] - [address] AT [facility] OF [address] & [facility] [address]  *Patient states pharmacy doesn't have prescription yet. (Name of pill was not disclosed in letter)

## 2023-10-09 MED ORDER — RYBELSUS 3 MG PO TABS: 3.0000 mg | ORAL_TABLET | Freq: Every day | ORAL | 1 refills | Status: DC

## 2023-10-09 NOTE — Telephone Encounter (Signed)
Rybelsus sent to pharmacy

## 2023-10-09 NOTE — Addendum Note (Signed)
 Addended by: Pincus Sanes on: 10/09/2023 09:07 PM   Modules accepted: Orders

## 2023-10-24 ENCOUNTER — Telehealth: Payer: Self-pay | Admitting: Internal Medicine

## 2023-10-24 ENCOUNTER — Other Ambulatory Visit: Payer: Self-pay | Admitting: Internal Medicine

## 2023-10-24 MED ORDER — GABAPENTIN 300 MG PO CAPS: 300.0000 mg | ORAL_CAPSULE | Freq: Two times a day (BID) | ORAL | 1 refills | Status: DC

## 2023-10-24 MED ORDER — HYDROCODONE-ACETAMINOPHEN 5-325 MG PO TABS
1.0000 | ORAL_TABLET | Freq: Three times a day (TID) | ORAL | 0 refills | Status: DC | PRN
Start: 2023-10-24 — End: 2023-11-22

## 2023-10-24 NOTE — Telephone Encounter (Signed)
 LOV: 08/31/23 Last fill hydrocodone: 08/31/23, 90 tablet 0 refill

## 2023-10-24 NOTE — Telephone Encounter (Signed)
 Copied from CRM [id number]. Topic: Clinical - Medication Refill >> Oct 24, 2023  9:37 AM Elizebeth Brooking wrote: Most Recent Primary Care Visit:  Provider: BURNS, Bobette Mo  Department: [facility]  Visit Type: OFFICE VISIT  Date: 08/31/2023  Medication: HYDROcodone-acetaminophen (NORCO/VICODIN) 5-325 MG tablet gabapentin (NEURONTIN) 300 MG capsule   Has the patient contacted their pharmacy? Yes (Agent: If no, request that the patient contact the pharmacy for the refill. If patient does not wish to contact the pharmacy document the reason why and proceed with request.) (Agent: If yes, when and what did the pharmacy advise?)  Is this the correct pharmacy for this prescription? Yes If no, delete pharmacy and type the correct one.  This is the patient's preferred pharmacy:  [facility] #[id number] [place], [place] - [address] AT [facility] OF [facility] [address] & [facility] [address] [place] [address] Phone: [phone] Fax: [phone]    Has the prescription been filled recently? No  Is the patient out of the medication? Yes  Has the patient been seen for an appointment in the last year OR does the patient have an upcoming appointment? Yes  Can we respond through MyChart? No    Agent: Please be advised that Rx refills may take up to 3 business days. We ask that you follow-up with your pharmacy.

## 2023-10-24 NOTE — Telephone Encounter (Unsigned)
 Copied from CRM [id number]. Topic: Clinical - Medical Advice >> Oct 24, 2023  9:38 AM Elizebeth Brooking wrote: Reason for CRM: Patient called in stating she wanted to let Dr.Burns know that she never received the cologuard items

## 2023-10-25 ENCOUNTER — Other Ambulatory Visit: Payer: Self-pay

## 2023-10-25 DIAGNOSIS — Z1211 Encounter for screening for malignant neoplasm of colon: Secondary | ICD-10-CM

## 2023-10-25 NOTE — Telephone Encounter (Signed)
 Called pt to make aware another order is put in place, unable to leave voice mail due to pt voice mail not being set up

## 2023-10-26 NOTE — Telephone Encounter (Signed)
Attempted to reach patient again, voicemail not set up.

## 2023-11-13 ENCOUNTER — Other Ambulatory Visit: Payer: Self-pay | Admitting: Internal Medicine

## 2023-11-22 ENCOUNTER — Other Ambulatory Visit: Payer: Self-pay | Admitting: Internal Medicine

## 2023-11-22 NOTE — Telephone Encounter (Signed)
 Copied from CRM [id number]. Topic: Clinical - Medication Refill >> Nov 22, 2023  4:59 PM Orien Bird wrote: Most Recent Primary Care Visit:  Provider: BURNS, Beckey Bourgeois  Department: [facility]  Visit Type: OFFICE VISIT  Date: 08/31/2023  Medication: gabapentin  (NEURONTIN ) 300 MG capsule and HYDROcodone -acetaminophen  (NORCO/VICODIN) 5-325 MG tablet  Has the patient contacted their pharmacy? No (Agent: If no, request that the patient contact the pharmacy for the refill. If patient does not wish to contact the pharmacy document the reason why and proceed with request.) (Agent: If yes, when and what did the pharmacy advise?)  Is this the correct pharmacy for this prescription? Yes If no, delete pharmacy and type the correct one.  This is the patient's preferred pharmacy:  [facility] DR[facility]ORE #[id number] [place], [place] - [address] AT [facility] OF [facility] [address] & [facility] [address] [place] [address] Phone: [phone] Fax: [phone]   Has the prescription been filled recently? No  Is the patient out of the medication? No  Has the patient been seen for an appointment in the last year OR does the patient have an upcoming appointment? Yes  Can we respond through MyChart? No  Agent: Please be advised that Rx refills may take up to 3 business days. We ask that you follow-up with your pharmacy.

## 2023-11-23 MED ORDER — HYDROCODONE-ACETAMINOPHEN 5-325 MG PO TABS: 1.0000 | ORAL_TABLET | Freq: Three times a day (TID) | ORAL | 0 refills | Status: DC | PRN

## 2023-11-23 MED ORDER — GABAPENTIN 300 MG PO CAPS: 300.0000 mg | ORAL_CAPSULE | Freq: Two times a day (BID) | ORAL | 1 refills | Status: DC

## 2023-12-17 ENCOUNTER — Other Ambulatory Visit: Payer: Self-pay | Admitting: Internal Medicine

## 2023-12-27 ENCOUNTER — Ambulatory Visit (INDEPENDENT_AMBULATORY_CARE_PROVIDER_SITE_OTHER)

## 2023-12-27 VITALS — Ht 64.0 in | Wt 248.0 lb

## 2023-12-27 DIAGNOSIS — Z1212 Encounter for screening for malignant neoplasm of rectum: Secondary | ICD-10-CM | POA: Diagnosis not present

## 2023-12-27 DIAGNOSIS — M8580 Other specified disorders of bone density and structure, unspecified site: Secondary | ICD-10-CM | POA: Diagnosis not present

## 2023-12-27 DIAGNOSIS — Z1231 Encounter for screening mammogram for malignant neoplasm of breast: Secondary | ICD-10-CM | POA: Diagnosis not present

## 2023-12-27 DIAGNOSIS — Z0000 Encounter for general adult medical examination without abnormal findings: Secondary | ICD-10-CM

## 2023-12-27 DIAGNOSIS — Z1211 Encounter for screening for malignant neoplasm of colon: Secondary | ICD-10-CM | POA: Diagnosis not present

## 2023-12-27 NOTE — Progress Notes (Signed)
 Subjective:   Debra Watkins is a 68 y.o. who presents for a Medicare Wellness preventive visit.  As a reminder, Annual Wellness Visits don't include a physical exam, and some assessments may be limited, especially if this visit is performed virtually. We may recommend an in-person follow-up visit with your provider if needed.  Visit Complete: Virtual I connected with  Debra Watkins on 12/27/23 by a audio enabled telemedicine application and verified that I am speaking with the correct person using two identifiers.  Patient Location: Home  Provider Location: Home Office  I discussed the limitations of evaluation and management by telemedicine. The patient expressed understanding and agreed to proceed.  Vital Signs: Because this visit was a virtual/telehealth visit, some criteria may be missing or patient reported. Any vitals not documented were not able to be obtained and vitals that have been documented are patient reported.  VideoDeclined- This patient declined Librarian, academic. Therefore the visit was completed with audio only.  Persons Participating in Visit: Patient.  AWV Questionnaire: No: Patient Medicare AWV questionnaire was not completed prior to this visit.  Cardiac Risk Factors include: advanced age (>100men, >68 women);hypertension;diabetes mellitus;dyslipidemia;obesity (BMI >30kg/m2)     Objective:     Today's Vitals   12/27/23 1305  Weight: 248 lb (112.5 kg)  Height: 5\' 4"  (1.626 m)   Body mass index is 42.57 kg/m.     12/27/2023    1:14 PM 06/10/2021    5:41 PM  Advanced Directives  Does Patient Have a Medical Advance Directive? No No  Would patient like information on creating a medical advance directive?  No - Patient declined    Current Medications (verified) Outpatient Encounter Medications as of 12/27/2023  Medication Sig   albuterol  (VENTOLIN  HFA) 108 (90 Base) MCG/ACT inhaler Inhale 2 puffs into the lungs  every 6 (six) hours as needed.   ALPRAZolam  (XANAX ) 0.5 MG tablet TAKE 1/2 TO 1 TABLET(0.25 TO 0.5 MG) BY MOUTH THREE TIMES DAILY AS NEEDED FOR ANXIETY   citalopram  (CELEXA ) 20 MG tablet Take 1 tablet (20 mg total) by mouth daily.   clindamycin (CLEOCIN) 150 MG capsule Take 150 mg by mouth 4 (four) times daily.   furosemide  (LASIX ) 20 MG tablet TAKE 1 TABLET BY MOUTH DAILY. TAKE AN EXTRA PILL 1-2 TIMES PER WEEK IF NEEDED FOR SWELLING   gabapentin  (NEURONTIN ) 300 MG capsule Take 1 capsule (300 mg total) by mouth 2 (two) times daily.   HYDROcodone -acetaminophen  (NORCO/VICODIN) 5-325 MG tablet Take 1 tablet by mouth every 8 (eight) hours as needed.   levothyroxine  (SYNTHROID ) 112 MCG tablet TAKE 1 TABLET(112 MCG) BY MOUTH DAILY   lidocaine  (LIDODERM ) 5 % Place 1 patch onto the skin daily as needed. Remove & Discard patch within 12 hours or as directed by MD   metoprolol  tartrate (LOPRESSOR ) 25 MG tablet TAKE 1/2 TABLET(12.5 MG) BY MOUTH TWICE DAILY   potassium chloride  SA (KLOR-CON  M) 20 MEQ tablet Take 1 tablet (20 mEq total) by mouth daily. Take an extra pill when you take an extra water pill   rosuvastatin  (CRESTOR ) 5 MG tablet Take 1 tablet (5 mg total) by mouth 3 (three) times a week. TAKE 1 TABLET(5 MG) BY MOUTH 3 TIMES A WEEK   Semaglutide  (RYBELSUS ) 3 MG TABS Take 1 tablet (3 mg total) by mouth daily.   No facility-administered encounter medications on file as of 12/27/2023.    Allergies (verified) Penicillins and Tetanus toxoid   History: Past Medical History:  Diagnosis Date   Anxiety    Headache(784.0)    Hypercholesteremia    Hyperthyroidism    s/p I-131 ablation 10/2007   Hypothyroidism    post radiation   Lumbar back pain    Overweight(278.02)    Palpitations    related to hyperthyroidism   Pulmonary nodule    4mm LLL on CXR 09/19/08, not seen on CXR f/u x 5 since   Unspecified deficiency anemia    Vitamin D  deficiency    Past Surgical History:  Procedure Laterality  Date   CHOLECYSTECTOMY, LAPAROSCOPIC  2010   therapy for hyperthyroidism  10/2007   tonsillectomy  1963   Family History  Adopted: Yes  Problem Relation Age of Onset   Cancer - Colon Brother    Social History   Socioeconomic History   Marital status: Divorced    Spouse name: Not on file   Number of children: 0   Years of education: Not on file   Highest education level: Not on file  Occupational History   Occupation: RETIRED  Tobacco Use   Smoking status: Never   Smokeless tobacco: Never  Vaping Use   Vaping status: Never Used  Substance and Sexual Activity   Alcohol use: Not Currently    Comment: social use   Drug use: No   Sexual activity: Not on file  Other Topics Concern   Not on file  Social History Narrative   Pt was adopted   Works office of [employer]   Lives alone with 2 pets   Caring for elderly mother at [facility] (ALF), dad passed 11/2014   Social Drivers of Health   Financial Resource Strain: Low Risk  (12/27/2023)   Overall Financial Resource Strain (CARDIA)    Difficulty of Paying Living Expenses: Not hard at all  Food Insecurity: No Food Insecurity (12/27/2023)   Hunger Vital Sign    Worried About Running Out of Food in the Last Year: Never true    Ran Out of Food in the Last Year: Never true  Transportation Needs: No Transportation Needs (12/27/2023)   PRAPARE - Administrator, Civil Service (Medical): No    Lack of Transportation (Non-Medical): No  Physical Activity: Inactive (12/27/2023)   Exercise Vital Sign    Days of Exercise per Week: 0 days    Minutes of Exercise per Session: 0 min  Stress: No Stress Concern Present (12/27/2023)   [employer] of Occupational Health - Occupational Stress Questionnaire    Feeling of Stress : Not at all  Social Connections: Moderately Isolated (12/27/2023)   Social Connection and Isolation Panel [NHANES]    Frequency of Communication with Friends and Family: Never    Frequency of  Social Gatherings with Friends and Family: Never    Attends Religious Services: More than 4 times per year    Active Member of [employer] or Organizations: Yes    Attends Banker Meetings: Never    Marital Status: Divorced    Tobacco Counseling Counseling given: Not Answered    Clinical Intake:  Pre-visit preparation completed: Yes  Pain : No/denies pain     BMI - recorded: 45.57 Nutritional Status: BMI > 30  Obese Nutritional Risks: None Diabetes: Yes CBG done?: No Did pt. bring in CBG monitor from home?: No  Lab Results  Component Value Date   HGBA1C 7.4 (H) 08/31/2023   HGBA1C 7.2 (H) 05/01/2023   HGBA1C 6.8 (H) 12/29/2022     How often  do you need to have someone help you when you read instructions, pamphlets, or other written materials from your doctor or pharmacy?: 1 - Never  Interpreter Needed?: No  Information entered by :: Zhoe Catania, RMA   Activities of Daily Living     12/27/2023    1:10 PM  In your present state of health, do you have any difficulty performing the following activities:  Hearing? 0  Vision? 0  Difficulty concentrating or making decisions? 0  Walking or climbing stairs? 0  Dressing or bathing? 0  Doing errands, shopping? 0  Preparing Food and eating ? N  Using the Toilet? N  In the past six months, have you accidently leaked urine? N  Do you have problems with loss of bowel control? N  Managing your Medications? N  Managing your Finances? N  Housekeeping or managing your Housekeeping? N    Patient Care Team: Colene Dauphin, MD as PCP - General (Internal Medicine) Rendall, Mayra Specter, MD (Inactive) (Orthopedic Surgery) Thurman Flores, MD (Obstetrics and Gynecology) Tobin Forts, MD (Gastroenterology)  Indicate any recent Medical Services you may have received from other than [facility] providers in the past year (date may be approximate).     Assessment:    This is a routine wellness examination for  [place].  Hearing/Vision screen Hearing Screening - Comments:: Denies hearing difficulties   Vision Screening - Comments:: Wears eyeglasses/[facility] [facility] [employer]   Goals Addressed               This Visit's Progress     Patient Stated (pt-stated)        To keep her head above water/2025       Depression Screen     12/27/2023    1:18 PM 08/31/2023   10:55 AM 12/29/2022   10:09 AM 08/30/2022   10:06 AM 07/08/2022    9:33 AM 05/31/2022    8:12 AM 11/19/2019    8:25 AM  PHQ 2/9 Scores  PHQ - 2 Score 0 2 2 2 3 4  0  PHQ- 9 Score 0 6 5 5 8 9      Fall Risk     12/27/2023    1:15 PM 08/31/2023   10:55 AM 12/29/2022   10:08 AM 08/30/2022   10:06 AM 05/31/2022    8:11 AM  Fall Risk   Falls in the past year? 0 0 0 0 1  Number falls in past yr: 0 0 0 0 0  Injury with Fall? 0 0 0 0 0  Risk for fall due to : Impaired balance/gait No Fall Risks No Fall Risks No Fall Risks No Fall Risks  Follow up Falls evaluation completed;Falls prevention discussed Falls evaluation completed Falls evaluation completed Falls evaluation completed Falls evaluation completed    MEDICARE RISK AT HOME:  Medicare Risk at Home Any stairs in or around the home?: No If so, are there any without handrails?: No Home free of loose throw rugs in walkways, pet beds, electrical cords, etc?: Yes Adequate lighting in your home to reduce risk of falls?: Yes Life alert?: No Use of a cane, walker or w/c?: Yes (walker) Grab bars in the bathroom?: No Shower chair or bench in shower?: Yes Elevated toilet seat or a handicapped toilet?: Yes  TIMED UP AND GO:  Was the test performed?  No  Cognitive Function: Declined/Normal: No cognitive concerns noted by patient or family. Patient alert, oriented, able to answer questions appropriately and recall recent events. No signs  of memory loss or confusion.        Immunizations Immunization History  Administered Date(s) Administered   Fluad Quad(high Dose 65+)  05/31/2022   Fluad Trivalent(High Dose 65+) 05/01/2023   Influenza Split 05/04/2011   Influenza Whole 05/27/2008, 05/21/2009   Influenza,inj,Quad PF,6+ Mos 06/25/2013, 06/25/2014, 06/29/2015, 06/29/2016, 07/05/2017, 07/12/2018   PFIZER(Purple Top)SARS-COV-2 Vaccination 10/18/2019, 11/13/2019, 07/18/2020   Pneumococcal Conjugate-13 07/12/2018   Pneumococcal Polysaccharide-23 05/31/2022    Screening Tests Health Maintenance  Topic Date Due   Zoster Vaccines- Shingrix (1 of 2) Never done   Fecal DNA (Cologuard)  Never done   MAMMOGRAM  06/01/2014   OPHTHALMOLOGY EXAM  03/17/2017   COVID-19 Vaccine (4 - 2024-25 season) 04/02/2023   DEXA SCAN  08/30/2024 (Originally 05/11/2014)   HEMOGLOBIN A1C  02/28/2024   INFLUENZA VACCINE  03/01/2024   Diabetic kidney evaluation - eGFR measurement  08/30/2024   Diabetic kidney evaluation - Urine ACR  08/30/2024   FOOT EXAM  08/30/2024   Medicare Annual Wellness (AWV)  12/26/2024   Pneumonia Vaccine 27+ Years old  Completed   Hepatitis C Screening  Completed   HPV VACCINES  Aged Out   Meningococcal B Vaccine  Aged Out   DTaP/Tdap/Td  Discontinued   Colonoscopy  Discontinued    Health Maintenance  Health Maintenance Due  Topic Date Due   Zoster Vaccines- Shingrix (1 of 2) Never done   Fecal DNA (Cologuard)  Never done   MAMMOGRAM  06/01/2014   OPHTHALMOLOGY EXAM  03/17/2017   COVID-19 Vaccine (4 - 2024-25 season) 04/02/2023   Health Maintenance Items Addressed: Mammogram ordered, DEXA ordered, Cologuard Ordered, See Nurse Notes  Additional Screening:  Vision Screening: Recommended annual ophthalmology exams for early detection of glaucoma and other disorders of the eye.  Dental Screening: Recommended annual dental exams for proper oral hygiene  Community Resource Referral / Chronic Care Management: CRR required this visit?  No   CCM required this visit?  No   Plan:    I have personally reviewed and noted the following in the  patient's chart:   Medical and social history Use of alcohol, tobacco or illicit drugs  Current medications and supplements including opioid prescriptions. Patient is currently taking opioid prescriptions. Information provided to patient regarding non-opioid alternatives. Patient advised to discuss non-opioid treatment plan with their provider. Functional ability and status Nutritional status Physical activity Advanced directives List of other physicians Hospitalizations, surgeries, and ER visits in previous 12 months Vitals Screenings to include cognitive, depression, and falls Referrals and appointments  In addition, I have reviewed and discussed with patient certain preventive protocols, quality metrics, and best practice recommendations. A written personalized care plan for preventive services as well as general preventive health recommendations were provided to patient.   Nat Lowenthal L Taleia Sadowski, CMA   12/27/2023   After Visit Summary: (Mail) Due to this being a telephonic visit, the after visit summary with patients personalized plan was offered to patient via mail   Notes: Please refer to Routing Comments.

## 2023-12-27 NOTE — Patient Instructions (Signed)
 Debra Watkins , Thank you for taking time out of your busy schedule to complete your Annual Wellness Visit with me. I enjoyed our conversation and look forward to speaking with you again next year. I, as well as your care team,  appreciate your ongoing commitment to your health goals. Please review the following plan we discussed and let me know if I can assist you in the future. Your Game plan/ To Do List    Referrals: If you haven't heard from the office you've been referred to, please reach out to them at the phone provided.  You are due for a mammogram and a bone density.  Please call the number given to set up appointments. [facility] [employer] [phone]  Follow up Visits: Next Medicare AWV with our clinical staff: 12/30/2024.   Have you seen your provider in the last 6 months (3 months if uncontrolled diabetes)? Yes Next Office Visit with your provider: 01/01/2024.  Clinician Recommendations:  Aim for 30 minutes of exercise or brisk walking, 6-8 glasses of water, and 5 servings of fruits and vegetables each day. You are due for a Shingles vaccine and can get that done at your local pharmacy.  You are also due for a diabetic eye exam.  Please call to get scheduled.      This is a list of the screening recommended for you and due dates:  Health Maintenance  Topic Date Due   Zoster (Shingles) Vaccine (1 of 2) Never done   Cologuard (Stool DNA test)  Never done   Mammogram  06/01/2014   Eye exam for diabetics  03/17/2017   COVID-19 Vaccine (4 - 2024-25 season) 04/02/2023   DEXA scan (bone density measurement)  08/30/2024*   Hemoglobin A1C  02/28/2024   Flu Shot  03/01/2024   Yearly kidney function blood test for diabetes  08/30/2024   Yearly kidney health urinalysis for diabetes  08/30/2024   Complete foot exam   08/30/2024   Medicare Annual Wellness Visit  12/26/2024   Pneumonia Vaccine  Completed   Hepatitis C Screening  Completed   HPV Vaccine  Aged Out    Meningitis B Vaccine  Aged Out   DTaP/Tdap/Td vaccine  Discontinued   Colon Cancer Screening  Discontinued  *Topic was postponed. The date shown is not the original due date.    Advanced directives: (Declined) Advance directive discussed with you today. Even though you declined this today, please call our office should you change your mind, and we can give you the proper paperwork for you to fill out. Advance Care Planning is important because it:  [x]  Makes sure you receive the medical care that is consistent with your values, goals, and preferences  [x]  It provides guidance to your family and loved ones and reduces their decisional burden about whether or not they are making the right decisions based on your wishes.  Follow the link provided in your after visit summary or read over the paperwork we have mailed to you to help you started getting your Advance Directives in place. If you need assistance in completing these, please reach out to us  so that we can help you!  See attachments for Preventive Care and Fall Prevention Tips.

## 2023-12-31 ENCOUNTER — Encounter: Payer: Self-pay | Admitting: Internal Medicine

## 2023-12-31 NOTE — Progress Notes (Unsigned)
 Subjective:    Patient ID: Debra Watkins, female    DOB: 1955-08-03, 68 y.o.   MRN: [id number]      HPI Brytney is here for a Physical exam and her chronic medical problems.    Taking Lasix  as needed -she knows she should be taking this more, but it causes so much increased urination she does not like taking it.  She is still sleeping in the recliner with her legs elevated.  She is not very active.    Medications and allergies reviewed with patient and updated if appropriate.  Current Outpatient Medications on File Prior to Visit  Medication Sig Dispense Refill   albuterol  (VENTOLIN  HFA) 108 (90 Base) MCG/ACT inhaler Inhale 2 puffs into the lungs every 6 (six) hours as needed. 20.1 g 5   ALPRAZolam  (XANAX ) 0.5 MG tablet TAKE 1/2 TO 1 TABLET(0.25 TO 0.5 MG) BY MOUTH THREE TIMES DAILY AS NEEDED FOR ANXIETY 30 tablet 0   citalopram  (CELEXA ) 20 MG tablet Take 1 tablet (20 mg total) by mouth daily. 90 tablet 1   clindamycin (CLEOCIN) 150 MG capsule Take 150 mg by mouth 4 (four) times daily.     furosemide  (LASIX ) 20 MG tablet TAKE 1 TABLET BY MOUTH DAILY. TAKE AN EXTRA PILL 1-2 TIMES PER WEEK IF NEEDED FOR SWELLING 100 tablet 1   gabapentin  (NEURONTIN ) 300 MG capsule Take 1 capsule (300 mg total) by mouth 2 (two) times daily. 180 capsule 1   HYDROcodone -acetaminophen  (NORCO/VICODIN) 5-325 MG tablet Take 1 tablet by mouth every 8 (eight) hours as needed. 90 tablet 0   levothyroxine  (SYNTHROID ) 112 MCG tablet TAKE 1 TABLET(112 MCG) BY MOUTH DAILY 90 tablet 1   lidocaine  (LIDODERM ) 5 % Place 1 patch onto the skin daily as needed. Remove & Discard patch within 12 hours or as directed by MD 15 patch 0   metoprolol  tartrate (LOPRESSOR ) 25 MG tablet TAKE 1/2 TABLET(12.5 MG) BY MOUTH TWICE DAILY 90 tablet 1   potassium chloride  SA (KLOR-CON  M) 20 MEQ tablet Take 1 tablet (20 mEq total) by mouth daily. Take an extra pill when you take an extra water pill 100 tablet 1   rosuvastatin   (CRESTOR ) 5 MG tablet Take 1 tablet (5 mg total) by mouth 3 (three) times a week. TAKE 1 TABLET(5 MG) BY MOUTH 3 TIMES A WEEK 13 tablet 3   Semaglutide  (RYBELSUS ) 3 MG TABS Take 1 tablet (3 mg total) by mouth daily. 30 tablet 1   No current facility-administered medications on file prior to visit.    Review of Systems  Constitutional:  Negative for fever.  HENT:  Negative for postnasal drip.   Eyes:  Negative for visual disturbance.  Respiratory:  Positive for cough (? allergies). Negative for shortness of breath and wheezing.   Cardiovascular:  Positive for leg swelling (chronic -stable). Negative for chest pain and palpitations.  Gastrointestinal:  Negative for abdominal pain, blood in stool, constipation and diarrhea.       No gerd  Genitourinary:  Negative for dysuria.  Musculoskeletal:  Positive for arthralgias (knees intermittently) and back pain.  Skin:  Negative for rash.  Neurological:  Negative for light-headedness and headaches.  Psychiatric/Behavioral:  Negative for dysphoric mood and sleep disturbance. The patient is nervous/anxious (controlled).        Objective:   Vitals:   01/01/24 1044  BP: 132/68  Pulse: 71  Temp: 98.4 F (36.9 C)  SpO2: 94%   Filed Weights   01/01/24 1044  Weight: 237 lb (107.5 kg)   Body mass index is 40.68 kg/m.  BP Readings from Last 3 Encounters:  01/01/24 132/68  08/31/23 138/76  05/01/23 130/68    Wt Readings from Last 3 Encounters:  01/01/24 237 lb (107.5 kg)  12/27/23 248 lb (112.5 kg)  08/31/23 248 lb (112.5 kg)       Physical Exam Constitutional: She appears well-developed and well-nourished. No distress.  HENT:  Head: Normocephalic and atraumatic.  Right Ear: External ear normal. Normal ear canal and TM Left Ear: External ear normal.  Normal ear canal and TM Mouth/Throat: Oropharynx is clear and moist.  Eyes: Conjunctivae normal.  Neck: Neck supple. No tracheal deviation present. No thyromegaly present.  No  carotid bruit  Cardiovascular: Normal rate, regular rhythm and normal heart sounds.   No murmur heard.  No edema. Pulmonary/Chest: Effort normal and breath sounds normal. No respiratory distress. She has no wheezes. She has no rales.  Breast: deferred   Abdominal: Soft. She exhibits no distension. There is no tenderness.  Lymphadenopathy: She has no cervical adenopathy.  Skin: Skin is warm and dry. She is not diaphoretic.  Psychiatric: She has a normal mood and affect. Her behavior is normal.    Diabetic Foot Exam - Simple   Simple Foot Form Diabetic Foot exam was performed with the following findings: Yes 01/01/2024 11:10 AM  Visual Inspection No deformities, no ulcerations, no other skin breakdown bilaterally: Yes Sensation Testing Intact to touch and monofilament testing bilaterally: Yes Pulse Check Posterior Tibialis and Dorsalis pulse intact bilaterally: Yes Comments      Lab Results  Component Value Date   WBC 5.9 05/01/2023   HGB 13.1 05/01/2023   HCT 40.4 05/01/2023   PLT 195.0 05/01/2023   GLUCOSE 121 (H) 08/31/2023   CHOL 141 08/31/2023   TRIG 79.0 08/31/2023   HDL 56.50 08/31/2023   LDLDIRECT 121.2 05/07/2012   LDLCALC 68 08/31/2023   ALT 13 08/31/2023   AST 19 08/31/2023   NA 139 08/31/2023   K 4.3 08/31/2023   CL 103 08/31/2023   CREATININE 0.75 08/31/2023   BUN 10 08/31/2023   CO2 28 08/31/2023   TSH 1.57 08/31/2023   HGBA1C 7.4 (H) 08/31/2023   MICROALBUR 1.8 08/31/2023         Assessment & Plan:   Physical exam: Screening blood work  ordered Exercise  none Weight  obese Substance abuse  none   Reviewed recommended immunizations.   Health Maintenance  Topic Date Due   Zoster Vaccines- Shingrix (1 of 2) Never done   Fecal DNA (Cologuard)  Never done   MAMMOGRAM  06/01/2014   OPHTHALMOLOGY EXAM  03/17/2017   COVID-19 Vaccine (4 - 2024-25 season) 01/16/2024 (Originally 04/02/2023)   DEXA SCAN  08/30/2024 (Originally 05/11/2014)    HEMOGLOBIN A1C  02/28/2024   INFLUENZA VACCINE  03/01/2024   Diabetic kidney evaluation - eGFR measurement  08/30/2024   Diabetic kidney evaluation - Urine ACR  08/30/2024   FOOT EXAM  08/30/2024   Medicare Annual Wellness (AWV)  12/26/2024   Pneumonia Vaccine 17+ Years old  Completed   Hepatitis C Screening  Completed   HPV VACCINES  Aged Out   Meningococcal B Vaccine  Aged Out   DTaP/Tdap/Td  Discontinued   Colonoscopy  Discontinued      Advised getting her eyes examined  cologuard ordered by nurse    See Problem List for Assessment and Plan of chronic medical problems.

## 2023-12-31 NOTE — Patient Instructions (Addendum)
 Blood work was ordered.       Medications changes include :   None    A referral was ordered and someone will call you to schedule an appointment.     Return in about 4 months (around 05/02/2024) for follow up.   Health Maintenance, Female Adopting a healthy lifestyle and getting preventive care are important in promoting health and wellness. Ask your health care provider about: The right schedule for you to have regular tests and exams. Things you can do on your own to prevent diseases and keep yourself healthy. What should I know about diet, weight, and exercise? Eat a healthy diet  Eat a diet that includes plenty of vegetables, fruits, low-fat dairy products, and lean protein. Do not eat a lot of foods that are high in solid fats, added sugars, or sodium. Maintain a healthy weight Body mass index (BMI) is used to identify weight problems. It estimates body fat based on height and weight. Your health care provider can help determine your BMI and help you achieve or maintain a healthy weight. Get regular exercise Get regular exercise. This is one of the most important things you can do for your health. Most adults should: Exercise for at least 150 minutes each week. The exercise should increase your heart rate and make you sweat (moderate-intensity exercise). Do strengthening exercises at least twice a week. This is in addition to the moderate-intensity exercise. Spend less time sitting. Even light physical activity can be beneficial. Watch cholesterol and blood lipids Have your blood tested for lipids and cholesterol at 67 years of age, then have this test every 5 years. Have your cholesterol levels checked more often if: Your lipid or cholesterol levels are high. You are older than 67 years of age. You are at high risk for heart disease. What should I know about cancer screening? Depending on your health history and family history, you may need to have cancer  screening at various ages. This may include screening for: Breast cancer. Cervical cancer. Colorectal cancer. Skin cancer. Lung cancer. What should I know about heart disease, diabetes, and high blood pressure? Blood pressure and heart disease High blood pressure causes heart disease and increases the risk of stroke. This is more likely to develop in people who have high blood pressure readings or are overweight. Have your blood pressure checked: Every 3-5 years if you are 29-33 years of age. Every year if you are 55 years old or older. Diabetes Have regular diabetes screenings. This checks your fasting blood sugar level. Have the screening done: Once every three years after age 62 if you are at a normal weight and have a low risk for diabetes. More often and at a younger age if you are overweight or have a high risk for diabetes. What should I know about preventing infection? Hepatitis B If you have a higher risk for hepatitis B, you should be screened for this virus. Talk with your health care provider to find out if you are at risk for hepatitis B infection. Hepatitis C Testing is recommended for: Everyone born from [birth year] through 1964. Anyone with known risk factors for hepatitis C. Sexually transmitted infections (STIs) Get screened for STIs, including gonorrhea and chlamydia, if: You are sexually active and are younger than 67 years of age. You are older than 67 years of age and your health care provider tells you that you are at risk for this type of infection. Your sexual activity has  changed since you were last screened, and you are at increased risk for chlamydia or gonorrhea. Ask your health care provider if you are at risk. Ask your health care provider about whether you are at high risk for HIV. Your health care provider may recommend a prescription medicine to help prevent HIV infection. If you choose to take medicine to prevent HIV, you should first get tested for HIV. You  should then be tested every 3 months for as long as you are taking the medicine. Pregnancy If you are about to stop having your period (premenopausal) and you may become pregnant, seek counseling before you get pregnant. Take 400 to 800 micrograms (mcg) of folic acid  every day if you become pregnant. Ask for birth control (contraception) if you want to prevent pregnancy. Osteoporosis and menopause Osteoporosis is a disease in which the bones lose minerals and strength with aging. This can result in bone fractures. If you are 73 years old or older, or if you are at risk for osteoporosis and fractures, ask your health care provider if you should: Be screened for bone loss. Take a calcium  or vitamin D  supplement to lower your risk of fractures. Be given hormone replacement therapy (HRT) to treat symptoms of menopause. Follow these instructions at home: Alcohol use Do not drink alcohol if: Your health care provider tells you not to drink. You are pregnant, may be pregnant, or are planning to become pregnant. If you drink alcohol: Limit how much you have to: 0-1 drink a day. Know how much alcohol is in your drink. In the [place] one drink equals one 12 oz bottle of beer (355 mL), one 5 oz glass of wine (148 mL), or one 1 oz glass of hard liquor (44 mL). Lifestyle Do not use any products that contain nicotine or tobacco. These products include cigarettes, chewing tobacco, and vaping devices, such as e-cigarettes. If you need help quitting, ask your health care provider. Do not use street drugs. Do not share needles. Ask your health care provider for help if you need support or information about quitting drugs. General instructions Schedule regular health, dental, and eye exams. Stay current with your vaccines. Tell your health care provider if: You often feel depressed. You have ever been abused or do not feel safe at home. Summary Adopting a healthy lifestyle and getting preventive care are  important in promoting health and wellness. Follow your health care provider's instructions about healthy diet, exercising, and getting tested or screened for diseases. Follow your health care provider's instructions on monitoring your cholesterol and blood pressure. This information is not intended to replace advice given to you by your health care provider. Make sure you discuss any questions you have with your health care provider. Document Revised: 12/07/2020 Document Reviewed: 12/07/2020 Elsevier Patient Education  2024 [employer].

## 2024-01-01 ENCOUNTER — Ambulatory Visit (INDEPENDENT_AMBULATORY_CARE_PROVIDER_SITE_OTHER): Payer: Medicare PPO | Admitting: Internal Medicine

## 2024-01-01 VITALS — BP 132/68 | HR 71 | Temp 98.4°F | Ht 64.0 in | Wt 237.0 lb

## 2024-01-01 DIAGNOSIS — I10 Essential (primary) hypertension: Secondary | ICD-10-CM

## 2024-01-01 DIAGNOSIS — Z0000 Encounter for general adult medical examination without abnormal findings: Secondary | ICD-10-CM | POA: Diagnosis not present

## 2024-01-01 DIAGNOSIS — Z6841 Body Mass Index (BMI) 40.0 and over, adult: Secondary | ICD-10-CM

## 2024-01-01 DIAGNOSIS — M48061 Spinal stenosis, lumbar region without neurogenic claudication: Secondary | ICD-10-CM

## 2024-01-01 DIAGNOSIS — E782 Mixed hyperlipidemia: Secondary | ICD-10-CM | POA: Diagnosis not present

## 2024-01-01 DIAGNOSIS — E1169 Type 2 diabetes mellitus with other specified complication: Secondary | ICD-10-CM

## 2024-01-01 DIAGNOSIS — E559 Vitamin D deficiency, unspecified: Secondary | ICD-10-CM | POA: Diagnosis not present

## 2024-01-01 DIAGNOSIS — F411 Generalized anxiety disorder: Secondary | ICD-10-CM | POA: Diagnosis not present

## 2024-01-01 DIAGNOSIS — E890 Postprocedural hypothyroidism: Secondary | ICD-10-CM

## 2024-01-01 DIAGNOSIS — R600 Localized edema: Secondary | ICD-10-CM | POA: Diagnosis not present

## 2024-01-01 DIAGNOSIS — Z794 Long term (current) use of insulin: Secondary | ICD-10-CM | POA: Diagnosis not present

## 2024-01-01 LAB — COMPREHENSIVE METABOLIC PANEL WITH GFR
ALT: 16 U/L (ref 0–35)
AST: 23 U/L (ref 0–37)
Albumin: 4.2 g/dL (ref 3.5–5.2)
Alkaline Phosphatase: 66 U/L (ref 39–117)
BUN: 12 mg/dL (ref 6–23)
CO2: 28 meq/L (ref 19–32)
Calcium: 9.7 mg/dL (ref 8.4–10.5)
Chloride: 100 meq/L (ref 96–112)
Creatinine, Ser: 0.85 mg/dL (ref 0.40–1.20)
GFR: 71.04 mL/min (ref >60.00)
Glucose, Bld: 118 mg/dL — ABNORMAL HIGH (ref 70–99)
Potassium: 4.1 meq/L (ref 3.5–5.1)
Sodium: 138 meq/L (ref 135–145)
Total Bilirubin: 0.7 mg/dL (ref 0.2–1.2)
Total Protein: 7.9 g/dL (ref 6.0–8.3)

## 2024-01-01 LAB — LIPID PANEL
Cholesterol: 156 mg/dL (ref 0–200)
HDL: 53.9 mg/dL (ref >39.00)
LDL Cholesterol: 80 mg/dL (ref 0–99)
NonHDL: 101.94
Total CHOL/HDL Ratio: 3
Triglycerides: 110 mg/dL (ref 0.0–149.0)
VLDL: 22 mg/dL (ref 0.0–40.0)

## 2024-01-01 LAB — CBC WITH DIFFERENTIAL/PLATELET
Basophils Absolute: 0 10*3/uL (ref 0.0–0.1)
Basophils Relative: 0.8 % (ref 0.0–3.0)
Eosinophils Absolute: 0.2 10*3/uL (ref 0.0–0.7)
Eosinophils Relative: 3.5 % (ref 0.0–5.0)
HCT: 42.3 % (ref 36.0–46.0)
Hemoglobin: 13.8 g/dL (ref 12.0–15.0)
Lymphocytes Relative: 23.7 % (ref 12.0–46.0)
Lymphs Abs: 1.2 10*3/uL (ref 0.7–4.0)
MCHC: 32.6 g/dL (ref 30.0–36.0)
MCV: 89.2 fl (ref 78.0–100.0)
Monocytes Absolute: 0.3 10*3/uL (ref 0.1–1.0)
Monocytes Relative: 5.7 % (ref 3.0–12.0)
Neutro Abs: 3.4 10*3/uL (ref 1.4–7.7)
Neutrophils Relative %: 66.3 % (ref 43.0–77.0)
Platelets: 201 10*3/uL (ref 150.0–400.0)
RBC: 4.74 Mil/uL (ref 3.87–5.11)
RDW: 14.5 % (ref 11.5–15.5)
WBC: 5.1 10*3/uL (ref 4.0–10.5)

## 2024-01-01 LAB — HEMOGLOBIN A1C: Hgb A1c MFr Bld: 6.8 % — ABNORMAL HIGH (ref 4.6–6.5)

## 2024-01-01 LAB — TSH: TSH: 3.33 u[IU]/mL (ref 0.35–5.50)

## 2024-01-01 LAB — VITAMIN D 25 HYDROXY (VIT D DEFICIENCY, FRACTURES): VITD: 56.01 ng/mL (ref 30.00–100.00)

## 2024-01-01 MED ORDER — RYBELSUS 3 MG PO TABS: 3.0000 mg | ORAL_TABLET | Freq: Every day | ORAL | 2 refills | Status: AC

## 2024-01-01 MED ORDER — GABAPENTIN 300 MG PO CAPS: 300.0000 mg | ORAL_CAPSULE | Freq: Two times a day (BID) | ORAL | 1 refills | Status: DC

## 2024-01-01 NOTE — Assessment & Plan Note (Signed)
 Chronic  Clinically euthyroid Check tsh and will titrate med dose if needed-due for recheck Currently taking levothyroxine  112 mcg daily

## 2024-01-01 NOTE — Assessment & Plan Note (Signed)
 Chronic Blood pressure well controlled CMP, CBC Continue metoprolol  12.5 mg twice daily

## 2024-01-01 NOTE — Assessment & Plan Note (Signed)
 Chronic Taking vitamin D daily Check vitamin D level

## 2024-01-01 NOTE — Assessment & Plan Note (Signed)
 Chronic Controlled, Stable Continue citalopram 20 mg daily, alprazolam 0.25 mg - 0.5 mg 3 times daily as needed

## 2024-01-01 NOTE — Assessment & Plan Note (Addendum)
 Chronic Pain is intermittent Pain is controlled Continue hydrocodone -acetaminophen  5-325 mg 1 pill every 8 hours as needed-typically takes 1 pill twice a day Continue gabapentin  300 mg bid Encouraged more activity [place]  controlled substance database checked and she is taking medication appropriately

## 2024-01-01 NOTE — Assessment & Plan Note (Addendum)
 Chronic Sleeping with legs elevated in recliner Has moderate swelling Has not been taking the Lasix  because it makes her go to the bathroom too much Continue Lasix  20 mg daily as needed and potassium 20 meq daily as needed-she should be taking this more frequently Stressed increasing her activity Stressed low-sodium diet

## 2024-01-01 NOTE — Assessment & Plan Note (Signed)
 Chronic Check lipid panel  Continue crestor 5 mg three times per week  Regular exercise and healthy diet encouraged

## 2024-01-01 NOTE — Assessment & Plan Note (Addendum)
 Chronic  Lab Results  Component Value Date   HGBA1C 7.4 (H) 08/31/2023   Sugars not ideally Check A1c Continue Rybelsus  3 mg daily-can increase depending on A1c Stressed regular exercise, diabetic diet

## 2024-01-01 NOTE — Assessment & Plan Note (Signed)
 Chronic Very sedentary-she will work on increasing her activity She has been trying to watch what she eats and will continue to do that She knows the benefits of weight loss

## 2024-01-03 ENCOUNTER — Ambulatory Visit: Payer: Self-pay | Admitting: Internal Medicine

## 2024-01-16 ENCOUNTER — Other Ambulatory Visit: Payer: Self-pay | Admitting: Internal Medicine

## 2024-01-16 NOTE — Telephone Encounter (Unsigned)
 Copied from CRM [id number]. Topic: Clinical - Medication Refill >> Jan 16, 2024  9:07 AM Dewanda Foots wrote: Medication: HYDROcodone -acetaminophen  (NORCO/VICODIN) 5-325 MG tablet  Has the patient contacted their pharmacy? Yes (Agent: If no, request that the patient contact the pharmacy for the refill. If patient does not wish to contact the pharmacy document the reason why and proceed with request.) (Agent: If yes, when and what did the pharmacy advise?)  This is the patient's preferred pharmacy:  [facility] #[id number] [place], [place] - [address] AT [facility] OF [facility] [address] & [facility] [address] [place] [address] Phone: [phone] Fax: [phone]   Is this the correct pharmacy for this prescription? Yes If no, delete pharmacy and type the correct one.   Has the prescription been filled recently? No  Is the patient out of the medication? No  Has the patient been seen for an appointment in the last year OR does the patient have an upcoming appointment? Yes  Can we respond through MyChart? No, please call [phone]  Agent: Please be advised that Rx refills may take up to 3 business days. We ask that you follow-up with your pharmacy.

## 2024-01-17 MED ORDER — HYDROCODONE-ACETAMINOPHEN 5-325 MG PO TABS: 1.0000 | ORAL_TABLET | Freq: Three times a day (TID) | ORAL | 0 refills | Status: DC | PRN

## 2024-03-08 ENCOUNTER — Other Ambulatory Visit: Payer: Self-pay | Admitting: Internal Medicine

## 2024-04-17 ENCOUNTER — Other Ambulatory Visit: Payer: Self-pay | Admitting: Internal Medicine

## 2024-04-17 NOTE — Telephone Encounter (Signed)
 Copied from CRM [id number]. Topic: Clinical - Medication Refill >> Apr 17, 2024  4:22 PM Wess RAMAN wrote: Medication: HYDROcodone -acetaminophen  (NORCO/VICODIN) 5-325 MG tablet   Has the patient contacted their pharmacy? Yes (Agent: If no, request that the patient contact the pharmacy for the refill. If patient does not wish to contact the pharmacy document the reason why and proceed with request.) (Agent: If yes, when and what did the pharmacy advise?)  This is the patient's preferred pharmacy:  [facility] #[id number] [place], [place] - [address] AT [facility] OF [facility] [address] & [facility] [address] [place] [address] Phone: [phone] Fax: [phone]  Is this the correct pharmacy for this prescription? Yes If no, delete pharmacy and type the correct one.   Has the prescription been filled recently? Yes  Is the patient out of the medication? Yes  Has the patient been seen for an appointment in the last year OR does the patient have an upcoming appointment? Yes  Can we respond through MyChart? No  Agent: Please be advised that Rx refills may take up to 3 business days. We ask that you follow-up with your pharmacy.

## 2024-04-18 MED ORDER — HYDROCODONE-ACETAMINOPHEN 5-325 MG PO TABS: 1.0000 | ORAL_TABLET | Freq: Three times a day (TID) | ORAL | 0 refills | Status: DC | PRN

## 2024-05-02 ENCOUNTER — Other Ambulatory Visit: Payer: Self-pay | Admitting: Internal Medicine

## 2024-05-02 ENCOUNTER — Ambulatory Visit: Admitting: Internal Medicine

## 2024-05-08 NOTE — Telephone Encounter (Signed)
 error

## 2024-05-23 ENCOUNTER — Emergency Department (HOSPITAL_COMMUNITY)
Admission: EM | Admit: 2024-05-23 | Discharge: 2024-05-23 | Disposition: A | Discharge status: Home / Self Care | Attending: Emergency Medicine | Admitting: Emergency Medicine

## 2024-05-23 ENCOUNTER — Encounter (HOSPITAL_COMMUNITY): Payer: Self-pay | Admitting: Emergency Medicine

## 2024-05-23 DIAGNOSIS — G8929 Other chronic pain: Secondary | ICD-10-CM | POA: Diagnosis not present

## 2024-05-23 DIAGNOSIS — R45851 Suicidal ideations: Secondary | ICD-10-CM | POA: Diagnosis not present

## 2024-05-23 DIAGNOSIS — M549 Dorsalgia, unspecified: Secondary | ICD-10-CM | POA: Diagnosis not present

## 2024-05-23 DIAGNOSIS — M5459 Other low back pain: Secondary | ICD-10-CM | POA: Diagnosis not present

## 2024-05-23 DIAGNOSIS — Z7401 Bed confinement status: Secondary | ICD-10-CM | POA: Diagnosis not present

## 2024-05-23 LAB — CBC
HCT: 44.8 % (ref 36.0–46.0)
Hemoglobin: 14.5 g/dL (ref 12.0–15.0)
MCH: 28.9 pg (ref 26.0–34.0)
MCHC: 32.4 g/dL (ref 30.0–36.0)
MCV: 89.4 fL (ref 80.0–100.0)
Platelets: 224 K/uL (ref 150–400)
RBC: 5.01 MIL/uL (ref 3.87–5.11)
RDW: 15.6 % — ABNORMAL HIGH (ref 11.5–15.5)
WBC: 4.8 K/uL (ref 4.0–10.5)
nRBC: 0.4 % — ABNORMAL HIGH (ref 0.0–0.2)

## 2024-05-23 MED ORDER — GABAPENTIN 100 MG PO CAPS
200.0000 mg | ORAL_CAPSULE | Freq: Once | ORAL | Status: AC
  Administered 2024-05-23: 200 mg via ORAL
  Filled 2024-05-23: qty 2

## 2024-05-23 MED ORDER — HYDROCODONE-ACETAMINOPHEN 5-325 MG PO TABS
1.0000 | ORAL_TABLET | Freq: Once | ORAL | Status: AC
  Administered 2024-05-23: 1 via ORAL
  Filled 2024-05-23: qty 1

## 2024-05-23 NOTE — ED Triage Notes (Addendum)
 Pt reports spinal stenosis. Takes oxycodone  but has been out for few days of px due to inability to drive. Repeated statements about wanting to shoot herself. If I had a gun I would shoot myself. States it is because she is in so much pain and if we could get her out of pain, she would no longer want to hurt herself. A&O x 4 and lives alone.

## 2024-05-23 NOTE — ED Provider Notes (Signed)
 [place] EMERGENCY DEPARTMENT AT [facility] Provider Note   CSN: [id number] Arrival date & time: 05/23/24  0132     Patient presents with: Back Pain and Suicidal   Debra Watkins is a 68 y.o. female.   68 year old female presents emergency room with complaints of acute on chronic pain.  Patient reports history of spinal stenosis.  She  is prescribed 5 mg Norco.  This was refilled by her PCP last week however she has not had transportation to get to the pharmacy to get her medication.  Patient states that she does have family in the area however they are not helpful.  I addressed patient's comment from triage stating that she would shoot herself.  Patient states that she has no intentions of harming herself and does not have access to a gun.  She is just frustrated with her pain tonight.       Prior to Admission medications   Medication Sig Start Date End Date Taking? Authorizing Provider  albuterol  (VENTOLIN  HFA) 108 (90 Base) MCG/ACT inhaler Inhale 2 puffs into the lungs every 6 (six) hours as needed. 05/31/22   Burns, Glade PARAS, MD  ALPRAZolam  (XANAX ) 0.5 MG tablet TAKE 1/2 TO 1 TABLET(0.25 TO 0.5 MG) BY MOUTH THREE TIMES DAILY AS NEEDED FOR ANXIETY 05/31/22   Burns, Glade PARAS, MD  clindamycin (CLEOCIN) 150 MG capsule Take 150 mg by mouth 4 (four) times daily. 12/15/22   [provider]  furosemide  (LASIX ) 20 MG tablet TAKE 1 TABLET BY MOUTH DAILY. TAKE AN EXTRA PILL 1-2 TIMES PER WEEK IF NEEDED FOR SWELLING 11/13/23   Geofm Glade PARAS, MD  gabapentin  (NEURONTIN ) 300 MG capsule Take 1 capsule (300 mg total) by mouth 2 (two) times daily. 01/01/24   Geofm Glade PARAS, MD  HYDROcodone -acetaminophen  (NORCO/VICODIN) 5-325 MG tablet Take 1 tablet by mouth every 8 (eight) hours as needed. 04/18/24   Geofm Glade PARAS, MD  levothyroxine  (SYNTHROID ) 112 MCG tablet TAKE 1 TABLET(112 MCG) BY MOUTH DAILY 03/11/24   Geofm Glade PARAS, MD  metoprolol  tartrate (LOPRESSOR ) 25 MG tablet TAKE 1/2  TABLET(12.5 MG) BY MOUTH TWICE DAILY 12/18/23   Burns, Glade PARAS, MD  potassium chloride  SA (KLOR-CON  M) 20 MEQ tablet Take 1 tablet (20 mEq total) by mouth daily. Take an extra pill when you take an extra water pill 08/30/22   Burns, Glade PARAS, MD  rosuvastatin  (CRESTOR ) 5 MG tablet TAKE 1 TABLET(5 MG) BY MOUTH 3 TIMES A WEEK 05/02/24   Burns, Glade PARAS, MD  Semaglutide  (RYBELSUS ) 3 MG TABS Take 1 tablet (3 mg total) by mouth daily. 01/01/24   Geofm Glade PARAS, MD    Allergies: Penicillins and Tetanus toxoid    Review of Systems Negative except as per Updated Vital Signs BP 100/81   Pulse (!) 102   Temp 98.3 F (36.8 C)   Resp 19   SpO2 98%   Physical Exam Vitals and nursing note reviewed.  Constitutional:      General: She is not in acute distress.    Appearance: She is well-developed. She is not diaphoretic.  HENT:     Head: Normocephalic and atraumatic.  Pulmonary:     Effort: Pulmonary effort is normal.  Abdominal:     Palpations: Abdomen is soft.     Tenderness: There is no abdominal tenderness.  Skin:    General: Skin is warm and dry.  Neurological:     Mental Status: She is alert and oriented to person,  place, and time.  Psychiatric:        Behavior: Behavior normal.     (all labs ordered are listed, but only abnormal results are displayed) Labs Reviewed  CBC - Abnormal; Notable for the following components:      Result Value   RDW 15.6 (*)    nRBC 0.4 (*)    All other components within normal limits  COMPREHENSIVE METABOLIC PANEL WITH GFR  ETHANOL  URINE DRUG SCREEN    EKG: None  Radiology: No results found.   Procedures   Medications Ordered in the ED  HYDROcodone -acetaminophen  (NORCO/VICODIN) 5-325 MG per tablet 1 tablet (has no administration in time range)                                    Medical Decision Making Amount and/or Complexity of Data Reviewed Labs: ordered.  Risk Prescription drug management.   68 year old female with complaint  of pain.  States that she is out of her hydrocodone  and has not been able to get to the pharmacy for the past month to get her medication.  She denies any acute changes in her symptoms.  She has not had any falls or injuries.  Patient is not a danger to self or others.  Plan is to provide patient's home dose of her Norco and arrange transportation back to her house.  She will work on obtaining her medications.     Final diagnoses:  Chronic back pain, unspecified back location, unspecified back pain laterality    ED Discharge Orders     None          [name] 05/23/24 0415    Griselda Norris, MD 05/23/24 1101

## 2024-05-23 NOTE — Discharge Instructions (Signed)
 Take your pain medications as prescribed. Recheck with your primary care provider. Return to the ER for worsening or concerning symptoms.

## 2024-05-23 NOTE — ED Notes (Signed)
 PT transported via [facility].

## 2024-05-29 ENCOUNTER — Telehealth: Payer: Self-pay

## 2024-05-29 NOTE — Transitions of Care (Post Inpatient/ED Visit) (Signed)
   05/29/2024  Name: KANISHA DUBA MRN: [id number] DOB: 08-Apr-1956  Today's TOC FU Call Status: Today's TOC FU Call Status:: Unsuccessful Call (1st Attempt) Unsuccessful Call (1st Attempt) Date: 05/29/24  Attempted to reach the patient regarding the most recent Inpatient/ED visit.  Follow Up Plan: No further outreach attempts will be made at this time. We have been unable to contact the patient.  Signature Edsel Kerns, CMA

## 2024-06-08 ENCOUNTER — Other Ambulatory Visit: Payer: Self-pay

## 2024-06-08 ENCOUNTER — Emergency Department (HOSPITAL_COMMUNITY)

## 2024-06-08 ENCOUNTER — Encounter (HOSPITAL_COMMUNITY): Payer: Self-pay | Admitting: Internal Medicine

## 2024-06-08 ENCOUNTER — Inpatient Hospital Stay (HOSPITAL_COMMUNITY)
Admission: EM | Admit: 2024-06-08 | Discharge: 2024-06-12 | DRG: 644 | Disposition: A | Discharge status: Skilled Nursing Facility | Attending: Internal Medicine | Admitting: Internal Medicine

## 2024-06-08 DIAGNOSIS — E890 Postprocedural hypothyroidism: Principal | ICD-10-CM | POA: Diagnosis present

## 2024-06-08 DIAGNOSIS — N179 Acute kidney failure, unspecified: Secondary | ICD-10-CM | POA: Diagnosis present

## 2024-06-08 DIAGNOSIS — Z79890 Hormone replacement therapy (postmenopausal): Secondary | ICD-10-CM | POA: Diagnosis not present

## 2024-06-08 DIAGNOSIS — R45851 Suicidal ideations: Secondary | ICD-10-CM | POA: Diagnosis present

## 2024-06-08 DIAGNOSIS — Z9049 Acquired absence of other specified parts of digestive tract: Secondary | ICD-10-CM | POA: Diagnosis not present

## 2024-06-08 DIAGNOSIS — R0602 Shortness of breath: Secondary | ICD-10-CM | POA: Diagnosis not present

## 2024-06-08 DIAGNOSIS — M6281 Muscle weakness (generalized): Secondary | ICD-10-CM | POA: Diagnosis not present

## 2024-06-08 DIAGNOSIS — I951 Orthostatic hypotension: Secondary | ICD-10-CM | POA: Diagnosis present

## 2024-06-08 DIAGNOSIS — E1169 Type 2 diabetes mellitus with other specified complication: Secondary | ICD-10-CM

## 2024-06-08 DIAGNOSIS — E66812 Obesity, class 2: Secondary | ICD-10-CM | POA: Diagnosis present

## 2024-06-08 DIAGNOSIS — F411 Generalized anxiety disorder: Secondary | ICD-10-CM | POA: Diagnosis present

## 2024-06-08 DIAGNOSIS — E119 Type 2 diabetes mellitus without complications: Secondary | ICD-10-CM | POA: Diagnosis present

## 2024-06-08 DIAGNOSIS — G894 Chronic pain syndrome: Secondary | ICD-10-CM | POA: Diagnosis present

## 2024-06-08 DIAGNOSIS — E7800 Pure hypercholesterolemia, unspecified: Secondary | ICD-10-CM | POA: Diagnosis present

## 2024-06-08 DIAGNOSIS — M48061 Spinal stenosis, lumbar region without neurogenic claudication: Secondary | ICD-10-CM | POA: Diagnosis present

## 2024-06-08 DIAGNOSIS — E785 Hyperlipidemia, unspecified: Secondary | ICD-10-CM | POA: Diagnosis present

## 2024-06-08 DIAGNOSIS — M8580 Other specified disorders of bone density and structure, unspecified site: Secondary | ICD-10-CM | POA: Diagnosis not present

## 2024-06-08 DIAGNOSIS — Z7401 Bed confinement status: Secondary | ICD-10-CM | POA: Diagnosis not present

## 2024-06-08 DIAGNOSIS — E860 Dehydration: Secondary | ICD-10-CM | POA: Diagnosis present

## 2024-06-08 DIAGNOSIS — J4520 Mild intermittent asthma, uncomplicated: Secondary | ICD-10-CM

## 2024-06-08 DIAGNOSIS — I10 Essential (primary) hypertension: Secondary | ICD-10-CM | POA: Diagnosis present

## 2024-06-08 DIAGNOSIS — E871 Hypo-osmolality and hyponatremia: Secondary | ICD-10-CM | POA: Diagnosis present

## 2024-06-08 DIAGNOSIS — Z794 Long term (current) use of insulin: Secondary | ICD-10-CM | POA: Diagnosis not present

## 2024-06-08 DIAGNOSIS — F809 Developmental disorder of speech and language, unspecified: Secondary | ICD-10-CM | POA: Diagnosis present

## 2024-06-08 DIAGNOSIS — S42301D Unspecified fracture of shaft of humerus, right arm, subsequent encounter for fracture with routine healing: Secondary | ICD-10-CM | POA: Diagnosis not present

## 2024-06-08 DIAGNOSIS — R17 Unspecified jaundice: Secondary | ICD-10-CM | POA: Diagnosis present

## 2024-06-08 DIAGNOSIS — R531 Weakness: Secondary | ICD-10-CM | POA: Diagnosis not present

## 2024-06-08 DIAGNOSIS — J45909 Unspecified asthma, uncomplicated: Secondary | ICD-10-CM | POA: Diagnosis present

## 2024-06-08 DIAGNOSIS — E782 Mixed hyperlipidemia: Secondary | ICD-10-CM | POA: Diagnosis not present

## 2024-06-08 DIAGNOSIS — Z79891 Long term (current) use of opiate analgesic: Secondary | ICD-10-CM

## 2024-06-08 DIAGNOSIS — E876 Hypokalemia: Secondary | ICD-10-CM | POA: Diagnosis present

## 2024-06-08 DIAGNOSIS — M1990 Unspecified osteoarthritis, unspecified site: Secondary | ICD-10-CM | POA: Diagnosis not present

## 2024-06-08 DIAGNOSIS — E440 Moderate protein-calorie malnutrition: Secondary | ICD-10-CM | POA: Diagnosis present

## 2024-06-08 DIAGNOSIS — Z91199 Patient's noncompliance with other medical treatment and regimen due to unspecified reason: Secondary | ICD-10-CM

## 2024-06-08 DIAGNOSIS — Z6835 Body mass index (BMI) 35.0-35.9, adult: Secondary | ICD-10-CM | POA: Diagnosis not present

## 2024-06-08 DIAGNOSIS — R4182 Altered mental status, unspecified: Secondary | ICD-10-CM | POA: Diagnosis not present

## 2024-06-08 DIAGNOSIS — Z0189 Encounter for other specified special examinations: Secondary | ICD-10-CM | POA: Diagnosis not present

## 2024-06-08 LAB — COMPREHENSIVE METABOLIC PANEL WITH GFR
ALT: 28 U/L (ref 0–44)
AST: 44 U/L — ABNORMAL HIGH (ref 15–41)
Albumin: 3.4 g/dL — ABNORMAL LOW (ref 3.5–5.0)
Alkaline Phosphatase: 92 U/L (ref 38–126)
Anion gap: 18 — ABNORMAL HIGH (ref 5–15)
BUN: 14 mg/dL (ref 8–23)
CO2: 24 mmol/L (ref 22–32)
Calcium: 9.1 mg/dL (ref 8.9–10.3)
Chloride: 90 mmol/L — ABNORMAL LOW (ref 98–111)
Creatinine, Ser: 1.41 mg/dL — ABNORMAL HIGH (ref 0.44–1.00)
GFR, Estimated: 41 mL/min — ABNORMAL LOW (ref >60)
Glucose, Bld: 126 mg/dL — ABNORMAL HIGH (ref 70–99)
Potassium: 3.2 mmol/L — ABNORMAL LOW (ref 3.5–5.1)
Sodium: 132 mmol/L — ABNORMAL LOW (ref 135–145)
Total Bilirubin: 1.9 mg/dL — ABNORMAL HIGH (ref 0.0–1.2)
Total Protein: 7 g/dL (ref 6.5–8.1)

## 2024-06-08 LAB — CBC WITH DIFFERENTIAL/PLATELET
Abs Immature Granulocytes: 0.03 K/uL (ref 0.00–0.07)
Basophils Absolute: 0 K/uL (ref 0.0–0.1)
Basophils Relative: 0 %
Eosinophils Absolute: 0 K/uL (ref 0.0–0.5)
Eosinophils Relative: 0 %
HCT: 42 % (ref 36.0–46.0)
Hemoglobin: 14.6 g/dL (ref 12.0–15.0)
Immature Granulocytes: 1 %
Lymphocytes Relative: 30 %
Lymphs Abs: 1.6 K/uL (ref 0.7–4.0)
MCH: 29.7 pg (ref 26.0–34.0)
MCHC: 34.8 g/dL (ref 30.0–36.0)
MCV: 85.4 fL (ref 80.0–100.0)
Monocytes Absolute: 0.2 K/uL (ref 0.1–1.0)
Monocytes Relative: 3 %
Neutro Abs: 3.5 K/uL (ref 1.7–7.7)
Neutrophils Relative %: 66 %
Platelets: 196 K/uL (ref 150–400)
RBC: 4.92 MIL/uL (ref 3.87–5.11)
RDW: 16.1 % — ABNORMAL HIGH (ref 11.5–15.5)
WBC: 5.3 K/uL (ref 4.0–10.5)
nRBC: 0.6 % — ABNORMAL HIGH (ref 0.0–0.2)

## 2024-06-08 LAB — ETHANOL: Alcohol, Ethyl (B): <15 mg/dL (ref <15)

## 2024-06-08 LAB — HIV ANTIBODY (ROUTINE TESTING W REFLEX): HIV Screen 4th Generation wRfx: NONREACTIVE

## 2024-06-08 LAB — MAGNESIUM: Magnesium: 2 mg/dL (ref 1.7–2.4)

## 2024-06-08 LAB — GLUCOSE, CAPILLARY
Glucose-Capillary: 110 mg/dL — ABNORMAL HIGH (ref 70–99)
Glucose-Capillary: 125 mg/dL — ABNORMAL HIGH (ref 70–99)

## 2024-06-08 LAB — TSH: TSH: 50.43 u[IU]/mL — ABNORMAL HIGH (ref 0.350–4.500)

## 2024-06-08 LAB — BRAIN NATRIURETIC PEPTIDE: B Natriuretic Peptide: 37 pg/mL (ref 0.0–100.0)

## 2024-06-08 MED ORDER — SODIUM CHLORIDE 0.9% FLUSH
3.0000 mL | Freq: Two times a day (BID) | INTRAVENOUS | Status: DC
  Administered 2024-06-08 – 2024-06-12 (×6): 3 mL via INTRAVENOUS

## 2024-06-08 MED ORDER — ENOXAPARIN SODIUM 40 MG/0.4ML IJ SOSY
40.0000 mg | PREFILLED_SYRINGE | INTRAMUSCULAR | Status: DC
  Administered 2024-06-08 – 2024-06-11 (×4): 40 mg via SUBCUTANEOUS
  Filled 2024-06-08 (×4): qty 0.4

## 2024-06-08 MED ORDER — ROSUVASTATIN CALCIUM 5 MG PO TABS
5.0000 mg | ORAL_TABLET | ORAL | Status: DC
  Administered 2024-06-10 – 2024-06-12 (×2): 5 mg via ORAL
  Filled 2024-06-08 (×3): qty 1

## 2024-06-08 MED ORDER — ACETAMINOPHEN 325 MG PO TABS
650.0000 mg | ORAL_TABLET | Freq: Four times a day (QID) | ORAL | Status: DC | PRN
  Administered 2024-06-09 – 2024-06-10 (×3): 650 mg via ORAL
  Filled 2024-06-08 (×3): qty 2

## 2024-06-08 MED ORDER — POTASSIUM CHLORIDE CRYS ER 20 MEQ PO TBCR
40.0000 meq | EXTENDED_RELEASE_TABLET | Freq: Once | ORAL | Status: AC
  Administered 2024-06-08: 40 meq via ORAL
  Filled 2024-06-08: qty 2

## 2024-06-08 MED ORDER — SODIUM CHLORIDE 0.9 % IV SOLN: Freq: Once | INTRAVENOUS | Status: AC

## 2024-06-08 MED ORDER — GABAPENTIN 300 MG PO CAPS
300.0000 mg | ORAL_CAPSULE | Freq: Two times a day (BID) | ORAL | Status: DC
  Administered 2024-06-08 – 2024-06-12 (×8): 300 mg via ORAL
  Filled 2024-06-08 (×8): qty 1

## 2024-06-08 MED ORDER — ACETAMINOPHEN 650 MG RE SUPP: 650.0000 mg | Freq: Four times a day (QID) | RECTAL | Status: DC | PRN

## 2024-06-08 MED ORDER — POLYETHYLENE GLYCOL 3350 17 G PO PACK
17.0000 g | PACK | Freq: Every day | ORAL | Status: DC | PRN
  Filled 2024-06-08: qty 1

## 2024-06-08 MED ORDER — INSULIN ASPART 100 UNIT/ML IJ SOLN
0.0000 [IU] | Freq: Three times a day (TID) | INTRAMUSCULAR | Status: DC
  Administered 2024-06-09 – 2024-06-10 (×3): 1 [IU] via SUBCUTANEOUS
  Administered 2024-06-11: 2 [IU] via SUBCUTANEOUS
  Administered 2024-06-12: 1 [IU] via SUBCUTANEOUS
  Administered 2024-06-12: 2 [IU] via SUBCUTANEOUS
  Filled 2024-06-08 (×2): qty 1
  Filled 2024-06-08: qty 2
  Filled 2024-06-08: qty 1
  Filled 2024-06-08: qty 2
  Filled 2024-06-08: qty 1

## 2024-06-08 MED ORDER — LEVOTHYROXINE SODIUM 112 MCG PO TABS
112.0000 ug | ORAL_TABLET | Freq: Every day | ORAL | Status: DC
  Administered 2024-06-09 – 2024-06-12 (×4): 112 ug via ORAL
  Filled 2024-06-08 (×4): qty 1

## 2024-06-08 MED ORDER — METOPROLOL TARTRATE 25 MG PO TABS
25.0000 mg | ORAL_TABLET | Freq: Two times a day (BID) | ORAL | Status: DC
  Administered 2024-06-08: 25 mg via ORAL
  Filled 2024-06-08: qty 1

## 2024-06-08 MED ORDER — HYDROCODONE-ACETAMINOPHEN 5-325 MG PO TABS
1.0000 | ORAL_TABLET | Freq: Three times a day (TID) | ORAL | Status: DC | PRN
  Filled 2024-06-08: qty 1

## 2024-06-08 MED ORDER — ALBUTEROL SULFATE (2.5 MG/3ML) 0.083% IN NEBU: 3.0000 mL | INHALATION_SOLUTION | Freq: Four times a day (QID) | RESPIRATORY_TRACT | Status: DC | PRN

## 2024-06-08 MED ORDER — GERHARDT'S BUTT CREAM
TOPICAL_CREAM | Freq: Two times a day (BID) | CUTANEOUS | Status: DC
  Filled 2024-06-08 (×2): qty 60

## 2024-06-08 NOTE — H&P (Addendum)
 History and Physical   Debra Watkins FMW:[id number] DOB: [birth year] DOA: 06/08/2024  PCP: Geofm Glade PARAS, MD   Patient coming from: Home  Chief Complaint: Shortness of breath, feeling unwell  HPI: Debra Watkins is a 68 y.o. female with medical history significant of hypertension, hyperlipidemia, diabetes, hypothyroidism, anxiety, obesity, reactive airway disease, lumbar stenosis, chronic pain presenting with shortness of breath and feeling unwell.  History attained assistance of chart review.  Patient initially called EMS due to shortness of breath and also had felt unwell.  On EMS arrival they found the patient incontinent and covered in stool/sitting in stool in her recliner.  Patient does live alone.  While in the ED shortness of breath improved and currently has no significant complaint other than her chronic back pain.  Denies fevers, chills, chest pain, abdominal pain, constipation, nausea, vomiting.  ED Course: Vital signs in the ED stable.  Lab workup included CMP with sodium 132, potassium 3.2, chloride 90, bicarb normal gap of 18, creatinine elevated to 1.41 from baseline of 0.9, glucose 126, albumin 3.4, AST 44, T. bili 1.9.  CBC within normal limits.  BNP pending.  UDS, urinalysis pending.  Ethanol level normal.  Chest x-ray showed no acute abnormality.  CT head showed no acute abnormality. Patient received 40 mEq p.o. potassium and started on fluids at 125 cc an hour.  Review of Systems: As per HPI otherwise all other systems reviewed and are negative.  Past Medical History:  Diagnosis Date   Anxiety    Headache(784.0)    Hypercholesteremia    Hyperthyroidism    s/p I-131 ablation 10/2007   Hypothyroidism    post radiation   Lumbar back pain    Overweight(278.02)    Palpitations    related to hyperthyroidism   Pulmonary nodule    4mm LLL on CXR 09/19/08, not seen on CXR f/u x 5 since   Unspecified deficiency anemia    Vitamin D  deficiency     Past  Surgical History:  Procedure Laterality Date   CHOLECYSTECTOMY, LAPAROSCOPIC  2010   therapy for hyperthyroidism  10/2007   tonsillectomy  1963    Social History  reports that she has never smoked. She has never used smokeless tobacco. She reports that she does not currently use alcohol. She reports that she does not use drugs.  Allergies  Allergen Reactions   Penicillins    Tetanus Toxoid Hives    Family History  Adopted: Yes  Problem Relation Age of Onset   Cancer - Colon Brother   Reviewed on admission  Prior to Admission medications   Medication Sig Start Date End Date Taking? Authorizing Provider  albuterol  (VENTOLIN  HFA) 108 (90 Base) MCG/ACT inhaler Inhale 2 puffs into the lungs every 6 (six) hours as needed. 05/31/22   Burns, Glade PARAS, MD  ALPRAZolam  (XANAX ) 0.5 MG tablet TAKE 1/2 TO 1 TABLET(0.25 TO 0.5 MG) BY MOUTH THREE TIMES DAILY AS NEEDED FOR ANXIETY 05/31/22   Burns, Glade PARAS, MD  clindamycin (CLEOCIN) 150 MG capsule Take 150 mg by mouth 4 (four) times daily. 12/15/22   [provider]  furosemide  (LASIX ) 20 MG tablet TAKE 1 TABLET BY MOUTH DAILY. TAKE AN EXTRA PILL 1-2 TIMES PER WEEK IF NEEDED FOR SWELLING 11/13/23   Geofm Glade PARAS, MD  gabapentin  (NEURONTIN ) 300 MG capsule Take 1 capsule (300 mg total) by mouth 2 (two) times daily. 01/01/24   Geofm Glade PARAS, MD  HYDROcodone -acetaminophen  (NORCO/VICODIN) 5-325 MG tablet Take 1 tablet  by mouth every 8 (eight) hours as needed. 04/18/24   Geofm Glade PARAS, MD  levothyroxine  (SYNTHROID ) 112 MCG tablet TAKE 1 TABLET(112 MCG) BY MOUTH DAILY 03/11/24   Geofm Glade PARAS, MD  metoprolol  tartrate (LOPRESSOR ) 25 MG tablet TAKE 1/2 TABLET(12.5 MG) BY MOUTH TWICE DAILY 12/18/23   Burns, Glade PARAS, MD  potassium chloride  SA (KLOR-CON  M) 20 MEQ tablet Take 1 tablet (20 mEq total) by mouth daily. Take an extra pill when you take an extra water pill 08/30/22   Burns, Glade PARAS, MD  rosuvastatin  (CRESTOR ) 5 MG tablet TAKE 1 TABLET(5 MG) BY MOUTH  3 TIMES A WEEK 05/02/24   Burns, Glade PARAS, MD  Semaglutide  (RYBELSUS ) 3 MG TABS Take 1 tablet (3 mg total) by mouth daily. 01/01/24   Geofm Glade PARAS, MD    Physical Exam: Vitals:   06/08/24 1130 06/08/24 1145 06/08/24 1200 06/08/24 1215  BP: 122/81 115/76 119/73 122/81  Pulse: 88 76 80 82  Resp: 16 19 15 13   Temp:      SpO2: 98% 96% 97% 99%  Weight:      Height:        Physical Exam Constitutional:      General: She is not in acute distress.    Appearance: Normal appearance. She is obese.  HENT:     Head: Normocephalic and atraumatic.     Mouth/Throat:     Mouth: Mucous membranes are moist.     Pharynx: Oropharynx is clear.  Eyes:     Extraocular Movements: Extraocular movements intact.     Pupils: Pupils are equal, round, and reactive to light.  Cardiovascular:     Rate and Rhythm: Normal rate and regular rhythm.     Pulses: Normal pulses.     Heart sounds: Normal heart sounds.  Pulmonary:     Effort: Pulmonary effort is normal. No respiratory distress.     Breath sounds: Normal breath sounds.  Abdominal:     General: Bowel sounds are normal. There is no distension.     Palpations: Abdomen is soft.     Tenderness: There is no abdominal tenderness.  Musculoskeletal:        General: No swelling or deformity.     Right lower leg: Edema present.     Left lower leg: Edema present.  Skin:    General: Skin is warm and dry.  Neurological:     General: No focal deficit present.     Mental Status: Mental status is at baseline.  Psychiatric:        Mood and Affect: Affect is flat.        Speech: Speech is delayed.    Labs on Admission: I have personally reviewed following labs and imaging studies  CBC: Recent Labs  Lab 06/08/24 0951  WBC 5.3  NEUTROABS 3.5  HGB 14.6  HCT 42.0  MCV 85.4  PLT 196    Basic Metabolic Panel: Recent Labs  Lab 06/08/24 0951  NA 132*  K 3.2*  CL 90*  CO2 24  GLUCOSE 126*  BUN 14  CREATININE 1.41*  CALCIUM  9.1    GFR: Estimated Creatinine Clearance: 41.4 mL/min (A) (by C-G formula based on SCr of 1.41 mg/dL (H)).  Liver Function Tests: Recent Labs  Lab 06/08/24 0951  AST 44*  ALT 28  ALKPHOS 92  BILITOT 1.9*  PROT 7.0  ALBUMIN 3.4*    Urine analysis:    Component Value Date/Time   COLORURINE YELLOW 06/10/2021 2046  APPEARANCEUR CLEAR 06/10/2021 2046   LABSPEC 1.011 06/10/2021 2046   PHURINE 5.0 06/10/2021 2046   GLUCOSEU NEGATIVE 06/10/2021 2046   GLUCOSEU NEGATIVE 06/29/2015 0903   HGBUR SMALL (A) 06/10/2021 2046   BILIRUBINUR NEGATIVE 06/10/2021 2046   KETONESUR NEGATIVE 06/10/2021 2046   PROTEINUR NEGATIVE 06/10/2021 2046   UROBILINOGEN 0.2 06/29/2015 0903   NITRITE NEGATIVE 06/10/2021 2046   LEUKOCYTESUR LARGE (A) 06/10/2021 2046    Radiological Exams on Admission: CT Head Wo Contrast Result Date: 06/08/2024 EXAM: CT HEAD WITHOUT CONTRAST 06/08/2024 10:25:47 AM TECHNIQUE: CT of the head was performed without the administration of intravenous contrast. Automated exposure control, iterative reconstruction, and/or weight based adjustment of the mA/kV was utilized to reduce the radiation dose to as low as reasonably achievable. COMPARISON: None available. CLINICAL HISTORY: 68 year old female with shortness of breath, agitation, and altered mental status. FINDINGS: BRAIN AND VENTRICLES: No acute hemorrhage. No evidence of acute infarct. No hydrocephalus. No extra-axial collection. No mass effect or midline shift. Brain volume within normal limits for age. Moderate for age patchy periventricular white matter hypodensity, nonspecific but most commonly due to small vessel disease. Calcified atherosclerosis at the skull base. No suspicious intracranial vascular hyperdensity. ORBITS: No acute abnormality. SINUSES: Paranasal sinuses, middle ears and mastoids are clear. SOFT TISSUES AND SKULL: No acute soft tissue abnormality. No skull fracture. Mild hyperostosis of the calvarium, normal  variant. IMPRESSION: 1. No acute intracranial abnormality. 2. Moderate for age white matter changes, most commonly due to small vessel disease. Electronically signed by: Helayne Hurst MD 06/08/2024 10:31 AM EST RP Workstation: [id number]   DG Chest Port 1 View Result Date: 06/08/2024 EXAM: 1 VIEW XRAY OF THE CHEST 06/08/2024 10:10:00 AM COMPARISON: None available. CLINICAL HISTORY: 06/25/13 FINDINGS: LUNGS AND PLEURA: No focal pulmonary opacity. No pulmonary edema. No pleural effusion. No pneumothorax. HEART AND MEDIASTINUM: No acute abnormality of the cardiac and mediastinal silhouettes. BONES AND SOFT TISSUES: No acute fracture or destructive lesion. Multilevel thoracic osteophytosis. IMPRESSION: 1. No acute cardiopulmonary process. Electronically signed by: Waddell Calk MD 06/08/2024 10:28 AM EST RP Workstation: [id number]   EKG: Independently reviewed.  Sinus rhythm at 83 bpm.  Nonspecific T wave changes.  PAC noted.  Mild baseline wander.  Assessment/Plan Principal Problem:   AKI (acute kidney injury) Active Problems:   Hypothyroidism following radioiodine therapy   Morbid obesity (HCC)   Anxiety state   Mild reactive airways disease   Essential hypertension, benign   Lumbar spinal stenosis with chronic back pain   Diabetes (HCC)   Hyperlipidemia  AKI Hypokalemia > Creatinine elevated to 1.41 from baseline 0.9.  Presumed decreased p.o. intake/Lasix  use. > Potassium 3.2. > Started on IV fluids in the ED and received 40 mEq p.o. potassium. - Continue with IV fluids - Check magnesium - Trend renal function and electrolytes  Atypical affect > Patient slow to answer and was found in feces by EMS unconcerned.  When asked about this she does eventually indicate some trouble getting up and down due to chronic pain. > SH e has flat affect and delayed speech for me. But does follow all commands and respond appropriately to question (though delayed and brief responses.) - Continue to  monitor, this may be near her baseline, no obvious indication that she is unable to care for herself. - PT eval and treat - Check TSH  Hypertension - Continue metoprolol  - Holding Lasix  in the setting of above  Hyperlipidemia - Continue home rosuvastatin   Diabetes - SSI  Hypothyroidism -  Continue home Synthroid  - TSH as above  Anxiety - No recent fill listed for anxiety medicine  Obesity - Noted  Reactive airway disease - Continue as needed albuterol   Chronic pain - Continue home gabapentin  and Norco  DVT prophylaxis: Lovenox Code Status:   Full Family Communication:  None on admission  Disposition Plan:   Patient is from:  Home  Anticipated DC to:  Home  Anticipated DC date:  1 to 2 days  Anticipated DC barriers: None  Consults called:  None Admission status:  To patient, telemetry  Severity of Illness: The appropriate patient status for this patient is OBSERVATION. Observation status is judged to be reasonable and necessary in order to provide the required intensity of service to ensure the patient's safety. The patient's presenting symptoms, physical exam findings, and initial radiographic and laboratory data in the context of their medical condition is felt to place them at decreased risk for further clinical deterioration. Furthermore, it is anticipated that the patient will be medically stable for discharge from the hospital within 2 midnights of admission.    Marsa KATHEE Scurry MD [facility]  How to contact the [facility] Attending or Consulting provider 7A - 7P or covering provider during after hours 7P -7A, for this patient?   Check the care team in [facility] and look for a) attending/consulting [facility] provider listed and b) the [facility] team listed Log into [email or web address] and use [place]'s universal password to access. If you do not have the password, please contact the hospital operator. Locate the [facility] provider you are looking for under [facility] and page  to a number that you can be directly reached. If you still have difficulty reaching the provider, please page the [facility] (Director on Call) for the Hospitalists listed on amion for assistance.  06/08/2024, 1:04 PM

## 2024-06-08 NOTE — ED Triage Notes (Signed)
 Patient presents to the ER via EMS from home with c/o SOB since last night. Patient denies cough, fever, n/v/d, or new pain. Patient reports chronic back pain only.

## 2024-06-08 NOTE — ED Notes (Signed)
 Patient encouraged to give a urine sample. Patient reports that she is unable at this time. Call light within patient's reach and was instructed to push the button when she feels that she needs to urinate. Patient verbalized understanding.

## 2024-06-08 NOTE — ED Notes (Signed)
 Patient presents to ER covered in stool. Patient given a complete bed bath. Patient irritated and agitated about having clothes removed and temperature of room. Patient instructed that she is being cared for by cleaning her of stool. Patient given clean gown and covered in warms blankets after bed bath and linen change.

## 2024-06-08 NOTE — ED Provider Notes (Signed)
 [facility] AT [facility] Provider Note   CSN: [id number] Arrival date & time: 06/08/24  9089     Patient presents with: No chief complaint on file.   Debra Watkins is a 68 y.o. female.   68 year old female with prior medical history as detailed below presents for evaluation.  Patient arrives from home with EMS transport.  Patient apparently complained to EMS of shortness of breath and feeling not good  that began yesterday evening.  EMS notes that the patient was incontinent of stool and covered with stool within her hands and feet and body.  She apparently was sitting in her stool on their initial evaluation.  The patient lives alone.  The patient is not very forthcoming with current symptoms.  She reports that her breathing is feels better now after transport.    She complains that her back hurts after the ride in the ambulance.  She appears to have a history of chronic back pain.  The history is provided by the patient and medical records.       Prior to Admission medications   Medication Sig Start Date End Date Taking? Authorizing Provider  albuterol  (VENTOLIN  HFA) 108 (90 Base) MCG/ACT inhaler Inhale 2 puffs into the lungs every 6 (six) hours as needed. 05/31/22   Burns, Glade PARAS, MD  ALPRAZolam  (XANAX ) 0.5 MG tablet TAKE 1/2 TO 1 TABLET(0.25 TO 0.5 MG) BY MOUTH THREE TIMES DAILY AS NEEDED FOR ANXIETY 05/31/22   Burns, Glade PARAS, MD  clindamycin (CLEOCIN) 150 MG capsule Take 150 mg by mouth 4 (four) times daily. 12/15/22   [provider]  furosemide  (LASIX ) 20 MG tablet TAKE 1 TABLET BY MOUTH DAILY. TAKE AN EXTRA PILL 1-2 TIMES PER WEEK IF NEEDED FOR SWELLING 11/13/23   Geofm Glade PARAS, MD  gabapentin  (NEURONTIN ) 300 MG capsule Take 1 capsule (300 mg total) by mouth 2 (two) times daily. 01/01/24   Geofm Glade PARAS, MD  HYDROcodone -acetaminophen  (NORCO/VICODIN) 5-325 MG tablet Take 1 tablet by mouth every 8 (eight) hours as needed. 04/18/24    Geofm Glade PARAS, MD  levothyroxine  (SYNTHROID ) 112 MCG tablet TAKE 1 TABLET(112 MCG) BY MOUTH DAILY 03/11/24   Geofm Glade PARAS, MD  metoprolol  tartrate (LOPRESSOR ) 25 MG tablet TAKE 1/2 TABLET(12.5 MG) BY MOUTH TWICE DAILY 12/18/23   Burns, Glade PARAS, MD  potassium chloride  SA (KLOR-CON  M) 20 MEQ tablet Take 1 tablet (20 mEq total) by mouth daily. Take an extra pill when you take an extra water pill 08/30/22   Geofm Glade PARAS, MD  rosuvastatin  (CRESTOR ) 5 MG tablet TAKE 1 TABLET(5 MG) BY MOUTH 3 TIMES A WEEK 05/02/24   Burns, Glade PARAS, MD  Semaglutide  (RYBELSUS ) 3 MG TABS Take 1 tablet (3 mg total) by mouth daily. 01/01/24   Geofm Glade PARAS, MD    Allergies: Penicillins and Tetanus toxoid    Review of Systems  All other systems reviewed and are negative.   Updated Vital Signs There were no vitals taken for this visit.  Physical Exam Vitals and nursing note reviewed.  Constitutional:      General: She is not in acute distress.    Appearance: She is well-developed.  HENT:     Head: Normocephalic and atraumatic.  Eyes:     Conjunctiva/sclera: Conjunctivae normal.  Cardiovascular:     Rate and Rhythm: Normal rate and regular rhythm.     Heart sounds: No murmur heard. Pulmonary:     Effort: Pulmonary effort is  normal. No respiratory distress.     Breath sounds: Normal breath sounds.  Abdominal:     Palpations: Abdomen is soft.     Tenderness: There is no abdominal tenderness.  Musculoskeletal:        General: No swelling.     Cervical back: Neck supple.  Skin:    General: Skin is warm and dry.     Capillary Refill: Capillary refill takes less than 2 seconds.     Comments: Stool is smeared across the patient's hands, lower extremities.  She has stool on her clothing as well.  Neurological:     General: No focal deficit present.     Mental Status: She is alert and oriented to person, place, and time. Mental status is at baseline.  Psychiatric:        Mood and Affect: Mood normal.      (all labs ordered are listed, but only abnormal results are displayed) Labs Reviewed  COMPREHENSIVE METABOLIC PANEL WITH GFR  CBC WITH DIFFERENTIAL/PLATELET  BRAIN NATRIURETIC PEPTIDE  ETHANOL  RAPID URINE DRUG SCREEN, HOSP PERFORMED  URINALYSIS, W/ REFLEX TO CULTURE (INFECTION SUSPECTED)    EKG: None  Radiology: No results found.   Procedures   Medications Ordered in the ED - No data to display                                  Medical Decision Making Patient presents from home after calling EMS.  She was found to be covered with stool.  She lives at home alone.  It is unclear whether she is able to care for herself appropriately.  Patient complained of not feeling well.  Baseline labs obtained suggest acute hyponatremia, hypokalemia, AKI.  Patient would likely benefit from admission for further workup and electrolyte correction.  Additionally social work may need to be involved for possible placement if patient is unable to care for herself at home.  Hospitalist service made aware of case.  Amount and/or Complexity of Data Reviewed Labs: ordered. Radiology: ordered.  Risk Prescription drug management.        Final diagnoses:  Hyponatremia  Hypokalemia  AKI (acute kidney injury)    ED Discharge Orders     None          Laurice Maude BROCKS, MD 06/08/24 1247

## 2024-06-08 NOTE — ED Notes (Signed)
 Patient rounded on and encouraged to give a urine sample. Patient stated that she is unable to go at this time and refused an in and out cath when RN recommended it. MD informed.  Patient resting comfortably in bed with regular, unlabored breathing.

## 2024-06-09 DIAGNOSIS — Z79890 Hormone replacement therapy (postmenopausal): Secondary | ICD-10-CM | POA: Diagnosis not present

## 2024-06-09 DIAGNOSIS — E440 Moderate protein-calorie malnutrition: Secondary | ICD-10-CM | POA: Diagnosis present

## 2024-06-09 DIAGNOSIS — Z794 Long term (current) use of insulin: Secondary | ICD-10-CM | POA: Diagnosis not present

## 2024-06-09 DIAGNOSIS — E890 Postprocedural hypothyroidism: Secondary | ICD-10-CM | POA: Diagnosis present

## 2024-06-09 DIAGNOSIS — E1169 Type 2 diabetes mellitus with other specified complication: Secondary | ICD-10-CM | POA: Diagnosis not present

## 2024-06-09 DIAGNOSIS — E860 Dehydration: Secondary | ICD-10-CM | POA: Diagnosis present

## 2024-06-09 DIAGNOSIS — I951 Orthostatic hypotension: Secondary | ICD-10-CM | POA: Diagnosis present

## 2024-06-09 DIAGNOSIS — J45909 Unspecified asthma, uncomplicated: Secondary | ICD-10-CM | POA: Diagnosis present

## 2024-06-09 DIAGNOSIS — E871 Hypo-osmolality and hyponatremia: Secondary | ICD-10-CM | POA: Diagnosis present

## 2024-06-09 DIAGNOSIS — F809 Developmental disorder of speech and language, unspecified: Secondary | ICD-10-CM | POA: Diagnosis present

## 2024-06-09 DIAGNOSIS — M48061 Spinal stenosis, lumbar region without neurogenic claudication: Secondary | ICD-10-CM | POA: Diagnosis present

## 2024-06-09 DIAGNOSIS — I10 Essential (primary) hypertension: Secondary | ICD-10-CM

## 2024-06-09 DIAGNOSIS — E782 Mixed hyperlipidemia: Secondary | ICD-10-CM | POA: Diagnosis not present

## 2024-06-09 DIAGNOSIS — E66812 Obesity, class 2: Secondary | ICD-10-CM | POA: Diagnosis present

## 2024-06-09 DIAGNOSIS — R17 Unspecified jaundice: Secondary | ICD-10-CM | POA: Diagnosis present

## 2024-06-09 DIAGNOSIS — F411 Generalized anxiety disorder: Secondary | ICD-10-CM | POA: Diagnosis present

## 2024-06-09 DIAGNOSIS — N179 Acute kidney failure, unspecified: Secondary | ICD-10-CM | POA: Diagnosis present

## 2024-06-09 DIAGNOSIS — R45851 Suicidal ideations: Secondary | ICD-10-CM | POA: Diagnosis present

## 2024-06-09 DIAGNOSIS — Z9049 Acquired absence of other specified parts of digestive tract: Secondary | ICD-10-CM | POA: Diagnosis not present

## 2024-06-09 DIAGNOSIS — Z79891 Long term (current) use of opiate analgesic: Secondary | ICD-10-CM | POA: Diagnosis not present

## 2024-06-09 DIAGNOSIS — G894 Chronic pain syndrome: Secondary | ICD-10-CM | POA: Diagnosis present

## 2024-06-09 DIAGNOSIS — Z6835 Body mass index (BMI) 35.0-35.9, adult: Secondary | ICD-10-CM | POA: Diagnosis not present

## 2024-06-09 DIAGNOSIS — E7800 Pure hypercholesterolemia, unspecified: Secondary | ICD-10-CM | POA: Diagnosis present

## 2024-06-09 DIAGNOSIS — E119 Type 2 diabetes mellitus without complications: Secondary | ICD-10-CM | POA: Diagnosis present

## 2024-06-09 DIAGNOSIS — E876 Hypokalemia: Secondary | ICD-10-CM | POA: Diagnosis present

## 2024-06-09 LAB — COMPREHENSIVE METABOLIC PANEL WITH GFR
ALT: 23 U/L (ref 0–44)
AST: 39 U/L (ref 15–41)
Albumin: 2.8 g/dL — ABNORMAL LOW (ref 3.5–5.0)
Alkaline Phosphatase: 77 U/L (ref 38–126)
Anion gap: 15 (ref 5–15)
BUN: 14 mg/dL (ref 8–23)
CO2: 22 mmol/L (ref 22–32)
Calcium: 8.7 mg/dL — ABNORMAL LOW (ref 8.9–10.3)
Chloride: 95 mmol/L — ABNORMAL LOW (ref 98–111)
Creatinine, Ser: 1.24 mg/dL — ABNORMAL HIGH (ref 0.44–1.00)
GFR, Estimated: 48 mL/min — ABNORMAL LOW (ref >60)
Glucose, Bld: 122 mg/dL — ABNORMAL HIGH (ref 70–99)
Potassium: 3.2 mmol/L — ABNORMAL LOW (ref 3.5–5.1)
Sodium: 132 mmol/L — ABNORMAL LOW (ref 135–145)
Total Bilirubin: 1.7 mg/dL — ABNORMAL HIGH (ref 0.0–1.2)
Total Protein: 6 g/dL — ABNORMAL LOW (ref 6.5–8.1)

## 2024-06-09 LAB — CBC
HCT: 35.6 % — ABNORMAL LOW (ref 36.0–46.0)
Hemoglobin: 12.6 g/dL (ref 12.0–15.0)
MCH: 30.1 pg (ref 26.0–34.0)
MCHC: 35.4 g/dL (ref 30.0–36.0)
MCV: 85 fL (ref 80.0–100.0)
Platelets: 134 K/uL — ABNORMAL LOW (ref 150–400)
RBC: 4.19 MIL/uL (ref 3.87–5.11)
RDW: 15.9 % — ABNORMAL HIGH (ref 11.5–15.5)
WBC: 5 K/uL (ref 4.0–10.5)
nRBC: 0 % (ref 0.0–0.2)

## 2024-06-09 LAB — GLUCOSE, CAPILLARY
Glucose-Capillary: 124 mg/dL — ABNORMAL HIGH (ref 70–99)
Glucose-Capillary: 131 mg/dL — ABNORMAL HIGH (ref 70–99)
Glucose-Capillary: 110 mg/dL — ABNORMAL HIGH (ref 70–99)
Glucose-Capillary: 134 mg/dL — ABNORMAL HIGH (ref 70–99)

## 2024-06-09 MED ORDER — POTASSIUM CHLORIDE CRYS ER 20 MEQ PO TBCR
40.0000 meq | EXTENDED_RELEASE_TABLET | ORAL | Status: AC
  Administered 2024-06-09 (×2): 40 meq via ORAL
  Filled 2024-06-09 (×2): qty 2

## 2024-06-09 MED ORDER — SODIUM CHLORIDE 0.9 % IV SOLN: INTRAVENOUS | Status: DC

## 2024-06-09 MED ORDER — GERHARDT'S BUTT CREAM
TOPICAL_CREAM | Freq: Three times a day (TID) | CUTANEOUS | Status: DC
  Filled 2024-06-09: qty 60

## 2024-06-09 NOTE — Progress Notes (Addendum)
 PROGRESS NOTE        PATIENT DETAILS Name: Debra Watkins Age: 68 y.o. Sex: female Date of Birth: 28-Nov-1955 Admit Date: 06/08/2024 Admitting Physician Marsa KATHEE Scurry, MD ERE:Almwd, Glade PARAS, MD  Brief Summary: Patient is a 67 y.o.  female with history of HTN, HLD, DM-2, hypothyroidism, anxiety, bronchial asthma, lumbar spinal stenosis with chronic pain syndrome (on narcotics) who presented to the ED with several weeks history of fatigue/malaise.  She apparently was sitting on a recliner for the past several days and not able to get up-she was able to call EMS who found her covered with stool/urine.  She was subsequently brought to the ED-found to have mild AKI/hyperkalemia/hyponatremia-and subsequently found to have severe hypothyroidism (not taking any of her medications including Synthroid  for at least a couple of months)  Significant events: 11/8>> admit to [facility]  Significant studies: 11/8>> no acute cardiopulmonary process 11/8>> CT head: No acute intracranial abnormality 11/8>> TSH: 50.4 (elevated)  Significant microbiology data: None  Procedures: None  Consults: None  Subjective: Poor historian-needs a lot of redirection-repeated questioning before she answers-apparently lives alone-after a lot of questioning-acknowledges that she has not been taking any of her medications for at least 2 months with the exception of narcotics.  Apparently walks with the help of a walker at baseline-and for the past several days has not been able to get around-so she sat in her recliner for at least 1-2 days.  Per nursing staff-some diarrhea/stool incontinence overnight.  Objective: Vitals: Blood pressure 97/61, pulse 63, temperature (!) 97.5 F (36.4 C), temperature source Axillary, resp. rate 15, height 5' 3 (1.6 m), weight 90.7 kg, SpO2 92%.   Exam: Gen Exam:Alert awake-not in any distress HEENT:atraumatic, normocephalic Chest: B/L clear to  auscultation anteriorly CVS:S1S2 regular Abdomen:soft non tender, non distended Extremities:no edema Neurology: Non focal-has generalized weakness-but easily able to flex both knees/bend both knees-left both legs off the bed. Skin: no rash  Pertinent Labs/Radiology:    Latest Ref Rng & Units 06/09/2024    4:32 AM 06/08/2024    9:51 AM 05/23/2024    3:30 AM  CBC  WBC 4.0 - 10.5 K/uL 5.0  5.3  4.8   Hemoglobin 12.0 - 15.0 g/dL 87.3  85.3  85.4   Hematocrit 36.0 - 46.0 % 35.6  42.0  44.8   Platelets 150 - 400 K/uL 134  196  224     Lab Results  Component Value Date   NA 132 (L) 06/09/2024   K 3.2 (L) 06/09/2024   CL 95 (L) 06/09/2024   CO2 22 06/09/2024      Assessment/Plan: AKI Secondary to dehydration from poor oral intake and diarrhea For at least a couple of days-not been able to move around-and mostly lying in a recliner-with very poor oral intake. Although renal function has improved-she appears dry on my exam today-has stool incontinence/diarrhea overnight-starting IVF  Hypokalemia Secondary to GI loss Replete/recheck.  Severe hypothyroidism Likely the cause of her generalized fatigue/malaise for the past several months This is secondary to noncompliance-stopped taking her Synthroid  for at least 2 months Resume Synthroid -see how she does  Borderline hypotension Probably due to diarrhea Stop metoprolol -restart IVF Check cortisol with a.m. labs  Lower extremity edema Appears minimally pitting Likely secondary to hypothyroidism Hold Lasix  due to soft blood pressure/AKI/hypokalemia  Mild hyperbilirubinemia Unclear significance-liver enzymes stable Follow  for now  HTN Holding all antihypertensives  HLD Statin  DM-2 (A1c 6.8 on 6/2) CBG stable with SSI  Recent Labs    06/08/24 1816 06/08/24 2045 06/09/24 0822  GLUCAP 110* 125* 124*     Bronchial asthma Not in exacerbation Continue bronchodilators  Lumbar spinal stenosis/chronic pain syndrome  on oral narcotics at home Continue Neurontin /Norco  Anxiety disorder Supportive care  Debility/deconditioning At baseline walks with walker/cane-much more weaker than usual baseline-sat on a recliner for at least 1-2 days covered in feces/urine.  Suspect significant deconditioning is from severe hypothyroidism Await PT/OT eval.  Other issues Lives alone-apparently has no children-has a brother that she has some very irregular contact with-has not spoken with him apparently in a few months.  Found sitting on a recliner-probably was on the recliner for several days-covered in urine/feces.  Await PT/OT/social work eval-currently not safe for discharge.  Class II obesity: Estimated body mass index is 35.43 kg/m as calculated from the following:   Height as of this encounter: 5' 3 (1.6 m).   Weight as of this encounter: 90.7 kg.   Code status:   Code Status: Full Code   DVT Prophylaxis: enoxaparin (LOVENOX) injection 40 mg Start: 06/08/24 2000   Family Communication: Brother-Andrew-[phone]-called on 11/9-but apparently number is not in service.  Will reach out to [facility] in the next day or so for assistance of still unable to get in touch with family.   Disposition Plan: Status is: Observation The patient will require care spanning > 2 midnights and should be moved to inpatient because: Severity of illness   Planned Discharge Destination:Skilled nursing facility   Diet: Diet Order             Diet regular Room service appropriate? Yes; Fluid consistency: Thin  Diet effective now                     Antimicrobial agents: Anti-infectives (From admission, onward)    None        MEDICATIONS: Scheduled Meds:  enoxaparin (LOVENOX) injection  40 mg Subcutaneous Q24H   gabapentin   300 mg Oral BID   Gerhardt's butt cream   Topical BID   insulin aspart  0-9 Units Subcutaneous TID WC   levothyroxine   112 mcg Oral Q0600   metoprolol  tartrate  25 mg Oral BID    potassium chloride   40 mEq Oral Q4H   [START ON 06/10/2024] rosuvastatin   5 mg Oral Q M,W,F   sodium chloride flush  3 mL Intravenous Q12H   Continuous Infusions: PRN Meds:.acetaminophen  **OR** acetaminophen , albuterol , HYDROcodone -acetaminophen , polyethylene glycol   I have personally reviewed following labs and imaging studies  LABORATORY DATA: CBC: Recent Labs  Lab 06/08/24 0951 06/09/24 0432  WBC 5.3 5.0  NEUTROABS 3.5  --   HGB 14.6 12.6  HCT 42.0 35.6*  MCV 85.4 85.0  PLT 196 134*    Basic Metabolic Panel: Recent Labs  Lab 06/08/24 0951 06/08/24 1835 06/09/24 0432  NA 132*  --  132*  K 3.2*  --  3.2*  CL 90*  --  95*  CO2 24  --  22  GLUCOSE 126*  --  122*  BUN 14  --  14  CREATININE 1.41*  --  1.24*  CALCIUM  9.1  --  8.7*  MG  --  2.0  --     GFR: Estimated Creatinine Clearance: 47.1 mL/min (A) (by C-G formula based on SCr of 1.24 mg/dL (H)).  Liver Function Tests:  Recent Labs  Lab 06/08/24 0951 06/09/24 0432  AST 44* 39  ALT 28 23  ALKPHOS 92 77  BILITOT 1.9* 1.7*  PROT 7.0 6.0*  ALBUMIN 3.4* 2.8*   No results for input(s): LIPASE, AMYLASE in the last 168 hours. No results for input(s): AMMONIA in the last 168 hours.  Coagulation Profile: No results for input(s): INR, PROTIME in the last 168 hours.  Cardiac Enzymes: No results for input(s): CKTOTAL, CKMB, CKMBINDEX, TROPONINI in the last 168 hours.  BNP (last 3 results) No results for input(s): PROBNP in the last 8760 hours.  Lipid Profile: No results for input(s): CHOL, HDL, LDLCALC, TRIG, CHOLHDL, LDLDIRECT in the last 72 hours.  Thyroid  Function Tests: Recent Labs    06/08/24 0931  TSH 50.430*    Anemia Panel: No results for input(s): VITAMINB12, FOLATE, FERRITIN, TIBC, IRON, RETICCTPCT in the last 72 hours.  Urine analysis:    Component Value Date/Time   COLORURINE YELLOW 06/10/2021 2046   APPEARANCEUR CLEAR 06/10/2021 2046    LABSPEC 1.011 06/10/2021 2046   PHURINE 5.0 06/10/2021 2046   GLUCOSEU NEGATIVE 06/10/2021 2046   GLUCOSEU NEGATIVE 06/29/2015 0903   HGBUR SMALL (A) 06/10/2021 2046   BILIRUBINUR NEGATIVE 06/10/2021 2046   KETONESUR NEGATIVE 06/10/2021 2046   PROTEINUR NEGATIVE 06/10/2021 2046   UROBILINOGEN 0.2 06/29/2015 0903   NITRITE NEGATIVE 06/10/2021 2046   LEUKOCYTESUR LARGE (A) 06/10/2021 2046    Sepsis Labs: Lactic Acid, Venous No results found for: LATICACIDVEN  MICROBIOLOGY: No results found for this or any previous visit (from the past 240 hours).  RADIOLOGY STUDIES/RESULTS: CT Head Wo Contrast Result Date: 06/08/2024 EXAM: CT HEAD WITHOUT CONTRAST 06/08/2024 10:25:47 AM TECHNIQUE: CT of the head was performed without the administration of intravenous contrast. Automated exposure control, iterative reconstruction, and/or weight based adjustment of the mA/kV was utilized to reduce the radiation dose to as low as reasonably achievable. COMPARISON: None available. CLINICAL HISTORY: 68 year old female with shortness of breath, agitation, and altered mental status. FINDINGS: BRAIN AND VENTRICLES: No acute hemorrhage. No evidence of acute infarct. No hydrocephalus. No extra-axial collection. No mass effect or midline shift. Brain volume within normal limits for age. Moderate for age patchy periventricular white matter hypodensity, nonspecific but most commonly due to small vessel disease. Calcified atherosclerosis at the skull base. No suspicious intracranial vascular hyperdensity. ORBITS: No acute abnormality. SINUSES: Paranasal sinuses, middle ears and mastoids are clear. SOFT TISSUES AND SKULL: No acute soft tissue abnormality. No skull fracture. Mild hyperostosis of the calvarium, normal variant. IMPRESSION: 1. No acute intracranial abnormality. 2. Moderate for age white matter changes, most commonly due to small vessel disease. Electronically signed by: Helayne Hurst MD 06/08/2024 10:31 AM EST RP  Workstation: [id number]   DG Chest Port 1 View Result Date: 06/08/2024 EXAM: 1 VIEW XRAY OF THE CHEST 06/08/2024 10:10:00 AM COMPARISON: None available. CLINICAL HISTORY: 06/25/13 FINDINGS: LUNGS AND PLEURA: No focal pulmonary opacity. No pulmonary edema. No pleural effusion. No pneumothorax. HEART AND MEDIASTINUM: No acute abnormality of the cardiac and mediastinal silhouettes. BONES AND SOFT TISSUES: No acute fracture or destructive lesion. Multilevel thoracic osteophytosis. IMPRESSION: 1. No acute cardiopulmonary process. Electronically signed by: Waddell Calk MD 06/08/2024 10:28 AM EST RP Workstation: [id number]     LOS: 0 days   Donalda Applebaum, MD  [facility]    To contact the attending provider between 7A-7P or the covering provider during after hours 7P-7A, please log into the web site [email or web address] and access using universal [place] password  for that web site. If you do not have the password, please call the hospital operator.  06/09/2024, 8:53 AM

## 2024-06-09 NOTE — Evaluation (Signed)
 Physical Therapy Evaluation Patient Details Name: Debra Watkins MRN: [id number] DOB: Nov 24, 1955 Today's Date: 06/09/2024  History of Present Illness  The pt is a 68 yo female presenting 11/8 with SOB. Work up revealed: AKI and severe hypothyroidism. PMH includes: HTN, HLD, DM II, hypothyroidism, anxiety, obesity (BMI 35), reactive airway disease, lumbar stenosis, and chronic pain.   Clinical Impression  Pt in bed upon arrival of PT, agreeable to evaluation at this time. Prior to admission the pt was ambulating with use of rollator in her home, reports limited driving or leaving her home in last month. Pt reports gradual decline in mobility over last month with last 2 days prior to arrival being restricted to her lift chair as she was unable to get up for any self-care. Pt generally describes poor ability to complete ADLs, skipping meals often due to inability to manage cooking, and no assist available from family or friends. Pt required modA to complete transition to EOB and minA to complete lateral scoot along EOB. Unable to progress to OOB mobility due to hypotension and fatigue. Given lack of assist available and significant limitations in mobility, recommend continued skilled PT acutely and post-acute inpatient therapies <3hours/day.   VITALS:  - sitting EOB - BP: 78/48 (58); HR: 74bpm - supine - BP: 60/41 (45); HR: 68bpm - supine end of session - BP: 99/59 (69); HR: 68bpm    If plan is discharge home, recommend the following: Two people to help with walking and/or transfers;Two people to help with bathing/dressing/bathroom;Assistance with cooking/housework;Direct supervision/assist for medications management;Direct supervision/assist for financial management;Assist for transportation;Help with stairs or ramp for entrance;Supervision due to cognitive status   Can travel by private vehicle   No    Equipment Recommendations Wheelchair (measurements PT);Wheelchair cushion (measurements  PT)  Recommendations for Other Services       Functional Status Assessment Patient has had a recent decline in their functional status and demonstrates the ability to make significant improvements in function in a reasonable and predictable amount of time.     Precautions / Restrictions Precautions Precautions: Fall Recall of Precautions/Restrictions: Intact Precaution/Restrictions Comments: watch hypotension Restrictions Weight Bearing Restrictions Per Provider Order: No      Mobility  Bed Mobility Overal bed mobility: Needs Assistance Bed Mobility: Supine to Sit, Sit to Supine     Supine to sit: Mod assist, HOB elevated, Used rails Sit to supine: Mod assist, Used rails   General bed mobility comments: minA to assist with LE to EOB, modA to elevate trunk and scoot to EOB. modA to return LE to bed and reposition in bed, pt assisting with UE on rails    Transfers Overall transfer level: Needs assistance   Transfers: Bed to chair/wheelchair/BSC            Lateral/Scoot Transfers: Min assist General transfer comment: pt declined attempt for sit-stand and was limited by soft BP (78/48 (58)) in sitting, was able to scoot laterally to R along EOB with minA at bed pad and BUE support    Ambulation/Gait               General Gait Details: deferred due to soft BP with MAP <60  Stairs            Wheelchair Mobility     Tilt Bed    Modified Rankin (Stroke Patients Only)       Balance Overall balance assessment: Needs assistance Sitting-balance support: No upper extremity supported, Feet supported Sitting balance-Leahy Scale: Fair Sitting  balance - Comments: prefers BUE support, can maintain without UE support but mild posterior lean Postural control: Posterior lean                                   Pertinent Vitals/Pain Pain Assessment Pain Assessment: 0-10 Pain Score: 7  Pain Location: back (chronic, everywhere) Pain Descriptors  / Indicators: Discomfort Pain Intervention(s): Monitored during session, Limited activity within patient's tolerance, Repositioned    Home Living Family/patient expects to be discharged to:: Private residence Living Arrangements: Alone Available Help at Discharge:  (brother that does not help) Type of Home: Apartment Home Access: Level entry       Home Layout: One level Home Equipment: Rollator (4 wheels);Lift chair      Prior Function Prior Level of Function : Independent/Modified Independent             Mobility Comments: pt reports use of rollator in the home, largely sedentary in lift chair, walking ~20 ft at a time ADLs Comments: reports independent due to lack of assist, but difficulty due to poor endurance, reports skipping meals due to difficulty cooking     Extremity/Trunk Assessment   Upper Extremity Assessment Upper Extremity Assessment: Generalized weakness;LUE deficits/detail;Right hand dominant LUE Deficits / Details: noted area around L elbow swollen, concern for IV infiltration and RN notified.    Lower Extremity Assessment Lower Extremity Assessment: Generalized weakness (grossly 4-/5 to MMT with limited endurance.)    Cervical / Trunk Assessment Cervical / Trunk Assessment: Other exceptions Cervical / Trunk Exceptions: large body habitus  Communication   Communication Communication: No apparent difficulties    Cognition Arousal: Alert Behavior During Therapy: Flat affect   PT - Cognitive impairments: No family/caregiver present to determine baseline, Orientation   Orientation impairments: Time                   PT - Cognition Comments: pt unable to state date, but oriented to year and month. answering questions with increased time but following commands well in session. poor awareness to deficits and need for assistance, apathy towards difficulty caring for herself Following commands: Impaired Following commands impaired: Follows one  step commands inconsistently, Follows one step commands with increased time     Cueing Cueing Techniques: Verbal cues     General Comments General comments (skin integrity, edema, etc.): BP soft 78/48 (58) sitting EOB and 60/41 (45) upon return to supine, improved to 99/59 (69) at end of session    Exercises     Assessment/Plan    PT Assessment Patient needs continued PT services  PT Problem List Decreased strength;Decreased activity tolerance;Decreased balance;Decreased mobility;Decreased coordination;Decreased knowledge of use of DME;Decreased cognition;Decreased safety awareness;Obesity       PT Treatment Interventions DME instruction;Gait training;Stair training;Functional mobility training;Therapeutic activities;Therapeutic exercise;Balance training;Patient/family education    PT Goals (Current goals can be found in the Care Plan section)  Acute Rehab PT Goals Patient Stated Goal: to improve mobility PT Goal Formulation: With patient Time For Goal Achievement: 06/23/24 Potential to Achieve Goals: Fair    Frequency Min 2X/week     Co-evaluation               AM-PAC PT 6 Clicks Mobility  Outcome Measure Help needed turning from your back to your side while in a flat bed without using bedrails?: A Lot Help needed moving from lying on your back to sitting on the side of  a flat bed without using bedrails?: A Lot Help needed moving to and from a bed to a chair (including a wheelchair)?: A Lot Help needed standing up from a chair using your arms (e.g., wheelchair or bedside chair)?: Total Help needed to walk in hospital room?: Total Help needed climbing 3-5 steps with a railing? : Total 6 Click Score: 9    End of Session   Activity Tolerance: Patient limited by fatigue;Treatment limited secondary to medical complications (Comment) (hypotension) Patient left: in bed;with call bell/phone within reach;with bed alarm set Nurse Communication: Mobility status;Other  (comment) (possible IV infiltration LUE) PT Visit Diagnosis: Unsteadiness on feet (R26.81);Muscle weakness (generalized) (M62.81);Difficulty in walking, not elsewhere classified (R26.2);Pain Pain - part of body:  (back)    Time: 8745-8671 PT Time Calculation (min) (ACUTE ONLY): 34 min   Charges:   PT Evaluation $PT Eval Moderate Complexity: 1 Mod PT Treatments $Therapeutic Activity: 8-22 mins PT General Charges $$ ACUTE PT VISIT: 1 Visit         Izetta Call, PT, DPT   Acute Rehabilitation Department Office [phone] Secure Chat Communication Preferred  Izetta JULIANNA Call 06/09/2024, 2:42 PM

## 2024-06-09 NOTE — Consult Note (Addendum)
 WOC Nurse Consult Note: Reason for Consult: buttocks/perineal wounds  Wound type: 1. Moisture Associated Skin Damage buttocks ICD-10 CM Codes for Irritant Dermatitis L24A2 - Due to fecal, urinary or dual incontinence  2.  Intertriginous dermatitis inner thighs/perineum extending onto labia  L30.4  - Erythema intertrigo. Also used for abrasion of the hand, chafing of the skin, dermatitis due to sweating and friction, friction dermatitis, friction eczema, and genital/thigh intertrigo.  Pressure Injury POA: NA not pressure  Measurement: widespread erythema with scattered partial thickness skin loss extending from sacrum over medial buttocks and perineum; inner thighs erythema with partial thickness skin loss; labia with scattered areas of partial thickness skin loss  Wound bed: as above  Drainage (amount, consistency, odor) see nursing flowsheet  Periwound: deep dusky red to sacrum/buttocks with likely fungal component  Dressing procedure/placement/frequency: Cleanse sacrum/buttocks/perineum/inner thighs labia with Vashe wound cleanser, do not rinse and allow to air dry. Apply Gerhardt's Butt Cream to entire area 3 times a day and prn soiling.  Can sprinkle over Gerhardt's with floor stock microguard powder (green and white label) for extra drying effect as desired. Would leave silicone foam off skin as will hold moisture onto skin.   POC discussed with bedside nurse. WOC team will not follow. Re-consult if further needs arise.   Thank you,    Powell Bar MSN, RN-BC, [employer]

## 2024-06-10 DIAGNOSIS — N179 Acute kidney failure, unspecified: Secondary | ICD-10-CM | POA: Diagnosis not present

## 2024-06-10 DIAGNOSIS — F411 Generalized anxiety disorder: Secondary | ICD-10-CM | POA: Diagnosis not present

## 2024-06-10 DIAGNOSIS — E782 Mixed hyperlipidemia: Secondary | ICD-10-CM | POA: Diagnosis not present

## 2024-06-10 DIAGNOSIS — I10 Essential (primary) hypertension: Secondary | ICD-10-CM | POA: Diagnosis not present

## 2024-06-10 LAB — URINALYSIS, W/ REFLEX TO CULTURE (INFECTION SUSPECTED)
Bilirubin Urine: NEGATIVE
Glucose, UA: NEGATIVE mg/dL
Hgb urine dipstick: NEGATIVE
Ketones, ur: NEGATIVE mg/dL
Leukocytes,Ua: NEGATIVE
Nitrite: NEGATIVE
Protein, ur: NEGATIVE mg/dL
Specific Gravity, Urine: 1.021 (ref 1.005–1.030)
pH: 5 (ref 5.0–8.0)

## 2024-06-10 LAB — BASIC METABOLIC PANEL WITH GFR
Anion gap: 9 (ref 5–15)
BUN: 14 mg/dL (ref 8–23)
CO2: 24 mmol/L (ref 22–32)
Calcium: 8.6 mg/dL — ABNORMAL LOW (ref 8.9–10.3)
Chloride: 100 mmol/L (ref 98–111)
Creatinine, Ser: 1.2 mg/dL — ABNORMAL HIGH (ref 0.44–1.00)
GFR, Estimated: 50 mL/min — ABNORMAL LOW (ref >60)
Glucose, Bld: 115 mg/dL — ABNORMAL HIGH (ref 70–99)
Potassium: 4.3 mmol/L (ref 3.5–5.1)
Sodium: 133 mmol/L — ABNORMAL LOW (ref 135–145)

## 2024-06-10 LAB — RAPID URINE DRUG SCREEN, HOSP PERFORMED
Amphetamines: NOT DETECTED
Barbiturates: NOT DETECTED
Benzodiazepines: NOT DETECTED
Cocaine: NOT DETECTED
Opiates: POSITIVE — AB
Tetrahydrocannabinol: NOT DETECTED

## 2024-06-10 LAB — GLUCOSE, CAPILLARY
Glucose-Capillary: 111 mg/dL — ABNORMAL HIGH (ref 70–99)
Glucose-Capillary: 148 mg/dL — ABNORMAL HIGH (ref 70–99)
Glucose-Capillary: 114 mg/dL — ABNORMAL HIGH (ref 70–99)
Glucose-Capillary: 159 mg/dL — ABNORMAL HIGH (ref 70–99)

## 2024-06-10 LAB — HEPATIC FUNCTION PANEL
ALT: 26 U/L (ref 0–44)
AST: 45 U/L — ABNORMAL HIGH (ref 15–41)
Albumin: 2.8 g/dL — ABNORMAL LOW (ref 3.5–5.0)
Alkaline Phosphatase: 75 U/L (ref 38–126)
Bilirubin, Direct: 0.3 mg/dL — ABNORMAL HIGH (ref 0.0–0.2)
Indirect Bilirubin: 0.9 mg/dL (ref 0.3–0.9)
Total Bilirubin: 1.2 mg/dL (ref 0.0–1.2)
Total Protein: 5.8 g/dL — ABNORMAL LOW (ref 6.5–8.1)

## 2024-06-10 LAB — CORTISOL-AM, BLOOD: Cortisol - AM: 22.3 ug/dL (ref 6.7–22.6)

## 2024-06-10 LAB — MAGNESIUM: Magnesium: 1.7 mg/dL (ref 1.7–2.4)

## 2024-06-10 LAB — PHOSPHORUS: Phosphorus: 1.9 mg/dL — ABNORMAL LOW (ref 2.5–4.6)

## 2024-06-10 MED ORDER — SODIUM CHLORIDE 0.9 % IV SOLN: INTRAVENOUS | Status: AC

## 2024-06-10 MED ORDER — THIAMINE MONONITRATE 100 MG PO TABS: 100.0000 mg | ORAL_TABLET | Freq: Every day | ORAL | Status: DC

## 2024-06-10 MED ORDER — THIAMINE MONONITRATE 100 MG PO TABS
100.0000 mg | ORAL_TABLET | Freq: Every day | ORAL | Status: DC
Start: 2024-06-10 — End: 2024-06-15
  Administered 2024-06-10 – 2024-06-12 (×3): 100 mg via ORAL
  Filled 2024-06-10 (×3): qty 1

## 2024-06-10 MED ORDER — ADULT MULTIVITAMIN W/MINERALS CH
1.0000 | ORAL_TABLET | Freq: Every day | ORAL | Status: DC
  Administered 2024-06-10 – 2024-06-12 (×3): 1 via ORAL
  Filled 2024-06-10 (×3): qty 1

## 2024-06-10 MED ORDER — POTASSIUM PHOSPHATES 15 MMOLE/5ML IV SOLN: 30.0000 mmol | Freq: Once | INTRAVENOUS | Status: DC

## 2024-06-10 MED ORDER — ENSURE PLUS HIGH PROTEIN PO LIQD
237.0000 mL | Freq: Two times a day (BID) | ORAL | Status: DC
  Administered 2024-06-11 – 2024-06-12 (×3): 237 mL via ORAL

## 2024-06-10 MED ORDER — SODIUM PHOSPHATES 45 MMOLE/15ML IV SOLN
30.0000 mmol | Freq: Once | INTRAVENOUS | Status: AC
  Administered 2024-06-10: 30 mmol via INTRAVENOUS
  Filled 2024-06-10: qty 10

## 2024-06-10 MED ORDER — MAGNESIUM SULFATE 2 GM/50ML IV SOLN
2.0000 g | Freq: Once | INTRAVENOUS | Status: AC
  Administered 2024-06-10: 2 g via INTRAVENOUS
  Filled 2024-06-10: qty 50

## 2024-06-10 NOTE — Progress Notes (Signed)
   06/10/24 1456  Mobility  Activity Moved bed into chair position;Turned to right side;Turned to left side;Turned to back - supine  Level of Assistance Maximum assist, patient does 25-49% (+2)  Assistive Device Other (Comment) (Bedrails)  Activity Response Tolerated fair  Mobility Referral Yes  Mobility visit 1 Mobility  Mobility Specialist Start Time (ACUTE ONLY) 1456  Mobility Specialist Stop Time (ACUTE ONLY) 1505  Mobility Specialist Time Calculation (min) (ACUTE ONLY) 9 min   Mobility Specialist: Progress Note  Post-Mobility:    HR 76  NT requested assisted with bath - Pt agreeable to mobility session - received in bed. Pt was asymptomatic throughout session with no complaints. Returned to chair position in bed with all needs met - call bell within reach. Bed alarm on.   Additional comments:  Virgle Boards, BS Mobility Specialist Please contact via SecureChat or  Rehab office at [phone].

## 2024-06-10 NOTE — TOC Initial Note (Signed)
 Transition of Care [facility]) - Initial/Assessment Note    Patient Details  Name: Debra Watkins MRN: [id number] Date of Birth: 1955/12/03  Transition of [facility]) CM/SW Contact:    Luann SHAUNNA Cumming, LCSW Phone Number: 06/10/2024, 11:27 AM  Clinical Narrative:                  CSW met with pt bedside to discuss snf recommendation. States she lives at home alone. She has a brother in [place] that she says she doesn't communicate with often. CSW explained w/u process, medicare coverage, and insurance authorization to pt. She is agreeable to SNF w/u. Fl2 completed and bed requests sent in hub.   Expected Discharge Plan: Skilled Nursing Facility Barriers to Discharge: Continued Medical Work up, SNF Pending bed offer, English As A Second Language Teacher     Expected Discharge Plan and Services       Living arrangements for the past 2 months: Apartment                                      Prior Living Arrangements/Services Living arrangements for the past 2 months: Apartment Lives with:: Self Patient language and need for interpreter reviewed:: Yes        Need for Family Participation in Patient Care: No (Comment) Care giver support system in place?: No (comment)   Criminal Activity/Legal Involvement Pertinent to Current Situation/Hospitalization: No - Comment as needed  Activities of Daily Living   ADL Screening (condition at time of admission) Independently performs ADLs?: Yes (appropriate for developmental age) Is the patient deaf or have difficulty hearing?: Yes Does the patient have difficulty seeing, even when wearing glasses/contacts?: No Does the patient have difficulty concentrating, remembering, or making decisions?: No  Emotional Assessment Appearance:: Appears stated age Attitude/Demeanor/Rapport: Lethargic Affect (typically observed): Blunt Orientation: : Oriented to Self, Oriented to Place, Oriented to Situation Alcohol / Substance Use: Not Applicable Psych  Involvement: No (comment)  Admission diagnosis:  Hypokalemia [E87.6] Hyponatremia [E87.1] AKI (acute kidney injury) [N17.9] Patient Active Problem List   Diagnosis Date Noted   AKI (acute kidney injury) 06/08/2024   Hyperlipidemia 08/31/2023   Encounter for pain management 08/30/2023   Pain management contract signed, 08/30/22 08/30/2022   Bilateral leg edema 05/31/2022   Requires assistance with activities of daily living (ADL) 06/06/2021   Chronic pain syndrome 05/28/2021   Diabetes (HCC) 07/02/2016   Essential hypertension, benign 06/29/2016   Lumbar spinal stenosis with chronic back pain 06/29/2016   Mild reactive airways disease 01/16/2015   DJD (degenerative joint disease) 05/07/2012   Osteopenia 05/07/2012   Vitamin D  deficiency 02/21/2009   Hypothyroidism following radioiodine therapy 03/10/2008   Morbid obesity (HCC) 12/13/2007   Anxiety state 12/13/2007   PCP:  Geofm Glade PARAS, MD Pharmacy:   [facility] [phone] [address] [place], [place] - [address] AT [facility] [address] & [facility] [address] [place] [address] Phone: [phone] Fax: [phone]  [facility] - [place], [place] - [address] [facility] AT Portal to Reg[facility] [address] [place] [address] Phone: [phone] Fax: [phone]     Social Drivers of Health (SDOH) Social History: SDOH Screenings   Food Insecurity: No Food Insecurity (06/08/2024)  Housing: Low Risk  (06/08/2024)  Transportation Needs: No Transportation Needs (06/08/2024)  Utilities: Not At Risk (06/08/2024)  Alcohol Screen: Low Risk  (12/27/2023)  Depression (PHQ2-9):  Low Risk  (12/27/2023)  Financial Resource Strain: Low Risk  (12/27/2023)  Physical Activity: Inactive (12/27/2023)  Social Connections: Socially Isolated (06/08/2024)  Stress: No Stress Concern Present (12/27/2023)  Tobacco Use: Low Risk  (05/23/2024)  Health Literacy: Adequate Health Literacy  (12/27/2023)   SDOH Interventions:     Readmission Risk Interventions     No data to display

## 2024-06-10 NOTE — TOC Progression Note (Signed)
 Transition of Care [facility]) - Progression Note    Patient Details  Name: Debra Watkins MRN: [id number] Date of Birth: 1955/11/14  Transition of Care [facility]) CM/SW Contact  Inocente GORMAN Kindle, LCSW Phone Number: 06/10/2024, 4:32 PM  Clinical Narrative:    CSW met with patient and provided SNF bed offers. She stated she may call her brother to assist in deciding.    Expected Discharge Plan: Skilled Nursing Facility Barriers to Discharge: Continued Medical Work up, SNF Pending bed offer, Insurance Authorization               Expected Discharge Plan and Services       Living arrangements for the past 2 months: Apartment                                       Social Drivers of Health (SDOH) Interventions SDOH Screenings   Food Insecurity: No Food Insecurity (06/08/2024)  Housing: Low Risk  (06/08/2024)  Transportation Needs: No Transportation Needs (06/08/2024)  Utilities: Not At Risk (06/08/2024)  Alcohol Screen: Low Risk  (12/27/2023)  Depression (PHQ2-9): Low Risk  (12/27/2023)  Financial Resource Strain: Low Risk  (12/27/2023)  Physical Activity: Inactive (12/27/2023)  Social Connections: Socially Isolated (06/08/2024)  Stress: No Stress Concern Present (12/27/2023)  Tobacco Use: Low Risk  (05/23/2024)  Health Literacy: Adequate Health Literacy (12/27/2023)    Readmission Risk Interventions     No data to display

## 2024-06-10 NOTE — Progress Notes (Addendum)
 Initial Nutrition Assessment  DOCUMENTATION CODES:   Non-severe (moderate) malnutrition in context of chronic illness  INTERVENTION:  Add Ensure Plus High Protein po BID, each supplement provides 350 kcal and 20 grams of protein  Add Magic cup TID with meals, each supplement provides 290 kcal and 9 grams of protein  Add MVI w/ minerals Monitor magnesium, potassium, and phosphorus daily for at least 3 days, MD to replete as needed  Add Thiamine 100 mg daily for 5 days  Liberalized diet with ordering assistance Monitor bowels and need for scheduled bowel regimen   NUTRITION DIAGNOSIS:  Moderate Malnutrition related to chronic illness as evidenced by percent weight loss, moderate muscle depletion.  GOAL:  Patient will meet greater than or equal to 90% of their needs  MONITOR:  PO intake, Supplement acceptance  REASON FOR ASSESSMENT:  Consult Assessment of nutrition requirement/status  ASSESSMENT:   Pt with PMH significant for: HTN, HLD, T2DM, hypothyroidism, anxiety, bronchial asthma, lumbar spinal stenosis w/ chronic pain syndrome. Presents reporting several weeks of fatigue/malaise and found to have AKI and severe hypothyroidism.  Patient lives alone and uses a walker at baseline. Endorses noncompliance with medications x2 months which includes levothyroxine . TSH notably elevated. Renal function improving with fluid resuscitation. She is at risk for refeeding as evidenced by prolonged poor PO intake and presence of malnutrition. Working toward SNF at d/c.   Average Meal Intake  11/10: 100% x1 documented meal  Spoke with her at bedside this morning. She endorses poor appetite for months. Cannot report a usual day's intake. Reports she ate Rice Krispies cereal for breakfast this morning. Poor dentition, but reports no difficulties chewing or swallowing.   Admit/Current Weight: 90.7 kg  She cannot endorse UBW. Per chart review, has shown 15.6% weight loss in last five months.  This is considered clinically significant for the time frame reviewed. Some mild, pitting edema noted to BLEs. Bowels have not moved since admission.   Meds: SS Novolog, levothyroxine   Drips: 2g Mg sulfate x1  30 mmol sodium phos x1  Labs:  Na+ 133 (L) K+ 3.2>4.3 (wdl) PHOS 1.9 (L) TSH 50.4 (H) CBGs 115-122 x24 hours A1c 6.8 (12/2023)  NUTRITION - FOCUSED PHYSICAL EXAM: Meets criteria for malnutrition in the context of chronic illness. Notably, large body habitus and edema may be masking additional depletions. Recommend repeat exam when edema resolves.  Flowsheet Row Most Recent Value  Orbital Region No depletion  Upper Arm Region No depletion  Thoracic and Lumbar Region No depletion  Buccal Region No depletion  Temple Region Mild depletion  Clavicle Bone Region Moderate depletion  Clavicle and Acromion Bone Region Moderate depletion  Scapular Bone Region Moderate depletion  Dorsal Hand No depletion  Patellar Region No depletion  Anterior Thigh Region No depletion  Posterior Calf Region No depletion  Edema (RD Assessment) Mild  Hair Reviewed  Eyes Reviewed  Mouth Reviewed  Skin Reviewed  Nails Reviewed     Diet Order:   Diet Order             Diet regular Room service appropriate? Yes; Fluid consistency: Thin  Diet effective now                   EDUCATION NEEDS:   Education needs have been addressed  Skin:  Skin Assessment: Reviewed RN Assessment  Last BM:  11/8 - type 7 x2  Height:  Ht Readings from Last 1 Encounters:  06/08/24 5' 3 (1.6 m)   Weight:  Wt Readings from Last 1 Encounters:  06/08/24 90.7 kg    Ideal Body Weight:  52.3 kg  BMI:  Body mass index is 35.43 kg/m.  Estimated Nutritional Needs:   Kcal:  1600-1800 kcals  Protein:  70-85g  Fluid:  1.6-1.8L/day  Blair Deaner MS, RD, LDN Registered Dietitian Clinical Nutrition RD Inpatient Contact Info in Amion

## 2024-06-10 NOTE — NC FL2 (Signed)
 [place]  MEDICAID FL2 LEVEL OF CARE FORM     IDENTIFICATION  Patient Name: Debra Watkins Birthdate: 01-17-1956 Sex: female Admission Date (Current Location): 06/08/2024  [facility] and [place] Number:  Producer, Television/film/video and Address:  [facility], [address], [place], [place] [address]      Provider Number: [id number]  Attending Physician Name and Address:  Raenelle Donalda HERO, MD  Relative Name and Phone Number:       Current Level of Care: Hospital Recommended Level of Care: Skilled Nursing Facility Prior Approval Number: [id number]  Date Approved/Denied:   PASRR Number: [id number]  Discharge Plan: SNF    Current Diagnoses: Patient Active Problem List   Diagnosis Date Noted   AKI (acute kidney injury) 06/08/2024   Hyperlipidemia 08/31/2023   Encounter for pain management 08/30/2023   Pain management contract signed, 08/30/22 08/30/2022   Bilateral leg edema 05/31/2022   Requires assistance with activities of daily living (ADL) 06/06/2021   Chronic pain syndrome 05/28/2021   Diabetes (HCC) 07/02/2016   Essential hypertension, benign 06/29/2016   Lumbar spinal stenosis with chronic back pain 06/29/2016   Mild reactive airways disease 01/16/2015   DJD (degenerative joint disease) 05/07/2012   Osteopenia 05/07/2012   Vitamin D  deficiency 02/21/2009   Hypothyroidism following radioiodine therapy 03/10/2008   Morbid obesity (HCC) 12/13/2007   Anxiety state 12/13/2007    Orientation RESPIRATION BLADDER Height & Weight     Self, Situation, Place  Normal Incontinent Weight: 200 lb (90.7 kg) Height:  5' 3 (160 cm)  BEHAVIORAL SYMPTOMS/MOOD NEUROLOGICAL BOWEL NUTRITION STATUS      Incontinent Diet (see d/c summary)  AMBULATORY STATUS COMMUNICATION OF NEEDS Skin   Extensive Assist Verbally Other (Comment) (irritant dermatitis labia,)                       Personal Care Assistance Level of Assistance  Bathing, Dressing,  Feeding Bathing Assistance: Maximum assistance Feeding assistance: Independent Dressing Assistance: Limited assistance     Functional Limitations Info  Sight, Hearing, Speech Sight Info: Adequate Hearing Info: Adequate Speech Info: Adequate    SPECIAL CARE FACTORS FREQUENCY  OT (By licensed OT), PT (By licensed PT)     PT Frequency: 5x/week OT Frequency: 5x/week            Contractures Contractures Info: Not present    Additional Factors Info  Code Status, Allergies Code Status Info: full code Allergies Info: Tetanus Toxoid, bee venom, Penicillins           Current Medications (06/10/2024):  This is the current hospital active medication list Current Facility-Administered Medications  Medication Dose Route Frequency Provider Last Rate Last Admin   0.9 %  sodium chloride infusion   Intravenous Continuous Raenelle Donalda HERO, MD 75 mL/hr at 06/10/24 0500 Infusion Verify at 06/10/24 0500   acetaminophen  (TYLENOL ) tablet 650 mg  650 mg Oral Q6H PRN Seena Marsa NOVAK, MD   650 mg at 06/09/24 2238   Or   acetaminophen  (TYLENOL ) suppository 650 mg  650 mg Rectal Q6H PRN Seena Marsa NOVAK, MD       albuterol  (PROVENTIL ) (2.5 MG/3ML) 0.083% nebulizer solution 3 mL  3 mL Inhalation Q6H PRN Seena Marsa NOVAK, MD       enoxaparin (LOVENOX) injection 40 mg  40 mg Subcutaneous Q24H Melvin, Alexander B, MD   40 mg at 06/09/24 2238   gabapentin  (NEURONTIN ) capsule 300 mg  300 mg Oral BID  Seena Marsa NOVAK, MD   300 mg at 06/10/24 1024   Gerhardt's butt cream   Topical TID Raenelle Donalda HERO, MD   Given at 06/10/24 1025   HYDROcodone -acetaminophen  (NORCO/VICODIN) 5-325 MG per tablet 1 tablet  1 tablet Oral Q8H PRN Melvin, Alexander B, MD       insulin aspart (novoLOG) injection 0-9 Units  0-9 Units Subcutaneous TID WC Melvin, Alexander B, MD   1 Units at 06/09/24 1218   levothyroxine  (SYNTHROID ) tablet 112 mcg  112 mcg Oral Q0600 Melvin, Alexander B, MD   112 mcg at 06/10/24 0444    polyethylene glycol (MIRALAX / GLYCOLAX) packet 17 g  17 g Oral Daily PRN Melvin, Alexander B, MD       rosuvastatin  (CRESTOR ) tablet 5 mg  5 mg Oral Q M,W,F Melvin, Alexander B, MD   5 mg at 06/10/24 1024   sodium chloride flush (NS) 0.9 % injection 3 mL  3 mL Intravenous Q12H Seena Marsa NOVAK, MD   3 mL at 06/10/24 1024   sodium phosphate 30 mmol in sodium chloride 0.9 % 250 mL infusion  30 mmol Intravenous Once Ghimire, Shanker M, MD 43 mL/hr at 06/10/24 1027 30 mmol at 06/10/24 1027     Discharge Medications: Please see discharge summary for a list of discharge medications.  Relevant Imaging Results:  Relevant Lab Results:   Additional Information SSN [id number]  [facility] [place], [place]

## 2024-06-10 NOTE — Progress Notes (Addendum)
 PROGRESS NOTE        PATIENT DETAILS Name: Debra Watkins Age: 68 y.o. Sex: female Date of Birth: 08/10/1955 Admit Date: 06/08/2024 Admitting Physician Marsa KATHEE Scurry, MD ERE:Almwd, Glade PARAS, MD  Brief Summary: Patient is a 68 y.o.  female with history of HTN, HLD, DM-2, hypothyroidism, anxiety, bronchial asthma, lumbar spinal stenosis with chronic pain syndrome (on narcotics) who presented to the ED with several weeks history of fatigue/malaise.  She apparently was sitting on a recliner for the past several days and not able to get up-she was able to call EMS who found her covered with stool/urine.  She was subsequently brought to the ED-found to have mild AKI/hyperkalemia/hyponatremia-and subsequently found to have severe hypothyroidism (not taking any of her medications including Synthroid  for at least a couple of months)   Significant events: 11/8>> admit to [facility]   Significant studies: 11/8>> no acute cardiopulmonary process 11/8>> CT head: No acute intracranial abnormality 11/8>> TSH: 50.4 (elevated) 11/10>> AM cortisol 22 (appropriate)   Significant microbiology data: None   Procedures: None   Consults: None  Subjective: Lying in bed, continues to have fatigue. No new or worsening concerns this morning.  Objective: Vitals: Blood pressure 98/66, pulse 72, temperature 97.6 F (36.4 C), temperature source Oral, resp. rate 15, height 5' 3 (1.6 m), weight 90.7 kg, SpO2 92%.   Exam: Constitutional: Tired and chronically ill appearing elderly female laying in bed. In no acute distress. HENT: Normocephalic, atraumatic,  Eyes: Sclera non-icteric, EOM intact Cardio:Regular rate and rhythm. Pulm:Clear to auscultation bilaterally in the anterior fields. Normal work of breathing on room air. Abdomen: Soft, non-tender, non-distended FDX:Wzhjupcz for extremity edema. Skin:Warm and dry. Neuro:Alert and oriented x3. Moves all 4 extremities against  gravity.  Pertinent Labs/Radiology:    Latest Ref Rng & Units 06/09/2024    4:32 AM 06/08/2024    9:51 AM 05/23/2024    3:30 AM  CBC  WBC 4.0 - 10.5 K/uL 5.0  5.3  4.8   Hemoglobin 12.0 - 15.0 g/dL 87.3  85.3  85.4   Hematocrit 36.0 - 46.0 % 35.6  42.0  44.8   Platelets 150 - 400 K/uL 134  196  224     Lab Results  Component Value Date   NA 133 (L) 06/10/2024   K 4.3 06/10/2024   CL 100 06/10/2024   CO2 24 06/10/2024      Assessment/Plan: Severe hypothyroidism Likely the cause of her generalized fatigue/malaise for the past several months This is secondary to noncompliance-stopped taking her Synthroid  for at least 2 months Resumed Synthroid  11/9- some mild improvement today  AKI Secondary to dehydration from poor oral intake and diarrhea For at least a couple of days PTA-not been able to move around-and mostly lying in a recliner-with very poor oral intake. Renal function improved and stable, continue IVF for poor oral intake today, GI losses resolved   Hypokalemia Secondary to GI loss Replete/recheck, 4.3 this AM  Hypomagnesemia Replete/recheck   Borderline hypotension Probably due to diarrhea which has resolved, AM cortisol 22, BP normal over past 24 hours Stopped metoprolol , continue to hold -continue IVF   Lower extremity edema Appears minimally pitting Likely secondary to hypothyroidism Hold Lasix  due to soft blood pressure/AKI/hypokalemia   Mild hyperbilirubinemia Unclear significance-liver enzymes stable, Tbili WNL now   HTN Holding all antihypertensives, BP improved and  normotensive off agents.  Continue to monitor and add back when appropriate  Hypophosphatemia 1.9 this AM Replete/recheck in AM   HLD Statin   DM-2 (A1c 6.8 on 6/2) CBG stable with SSI   Bronchial asthma Not in exacerbation Continue bronchodilators   Lumbar spinal stenosis/chronic pain syndrome on oral narcotics at home Continue Neurontin /Norco   Anxiety  disorder Supportive care   Debility/deconditioning At baseline walks with walker/cane-much weaker than usual baseline-sat on a recliner for at least 1-2 days covered in feces/urine.  Suspect significant deconditioning is from severe hypothyroidism Will need SNF   Other issues Lives alone-apparently has no children-has a brother that she has some very irregular contact with-has not spoken with him apparently in a few months.  Found sitting on a recliner-probably was on the recliner for several days-covered in urine/feces.  Await PT/OT/social work eval-currently not safe for discharge home   Class II obesity: Estimated body mass index is 35.43 kg/m as calculated from the following:   Height as of this encounter: 5' 3 (1.6 m).   Weight as of this encounter: 90.7 kg.   Nutrition Status: Nutrition Problem: Moderate Malnutrition Etiology: chronic illness Signs/Symptoms: percent weight loss, moderate muscle depletion Percent weight loss: 15.6 % Interventions: MVI, Ensure Enlive (each supplement provides 350kcal and 20 grams of protein), Magic cup, Liberalize Diet, Refer to RD note for recommendations    Code status:   Code Status: Full Code   DVT Prophylaxis: enoxaparin (LOVENOX) injection 40 mg Start: 06/08/24 2000     Family Communication: None at bedside   Disposition Plan: Status is: Inpatient Remains inpatient appropriate because: Severity of disease   Planned Discharge Destination:Skilled nursing facility   Diet: Diet Order             Diet regular Room service appropriate? Yes; Fluid consistency: Thin  Diet effective now                     Antimicrobial agents: Anti-infectives (From admission, onward)    None        MEDICATIONS: Scheduled Meds:  enoxaparin (LOVENOX) injection  40 mg Subcutaneous Q24H   gabapentin   300 mg Oral BID   Gerhardt's butt cream   Topical TID   insulin aspart  0-9 Units Subcutaneous TID WC   levothyroxine   112 mcg Oral  Q0600   rosuvastatin   5 mg Oral Q M,W,F   sodium chloride flush  3 mL Intravenous Q12H   Continuous Infusions:  sodium chloride 75 mL/hr at 06/10/24 0500   sodium PHOSPHATE IVPB (in mmol)     PRN Meds:.acetaminophen  **OR** acetaminophen , albuterol , HYDROcodone -acetaminophen , polyethylene glycol   I have personally reviewed following labs and imaging studies  LABORATORY DATA: CBC: Recent Labs  Lab 06/08/24 0951 06/09/24 0432  WBC 5.3 5.0  NEUTROABS 3.5  --   HGB 14.6 12.6  HCT 42.0 35.6*  MCV 85.4 85.0  PLT 196 134*    Basic Metabolic Panel: Recent Labs  Lab 06/08/24 0951 06/08/24 1835 06/09/24 0432 06/10/24 0413  NA 132*  --  132* 133*  K 3.2*  --  3.2* 4.3  CL 90*  --  95* 100  CO2 24  --  22 24  GLUCOSE 126*  --  122* 115*  BUN 14  --  14 14  CREATININE 1.41*  --  1.24* 1.20*  CALCIUM  9.1  --  8.7* 8.6*  MG  --  2.0  --  1.7  PHOS  --   --   --  1.9*    GFR: Estimated Creatinine Clearance: 48.6 mL/min (A) (by C-G formula based on SCr of 1.2 mg/dL (H)).  Liver Function Tests: Recent Labs  Lab 06/08/24 0951 06/09/24 0432 06/10/24 0413  AST 44* 39 45*  ALT 28 23 26   ALKPHOS 92 77 75  BILITOT 1.9* 1.7* 1.2  PROT 7.0 6.0* 5.8*  ALBUMIN 3.4* 2.8* 2.8*   No results for input(s): LIPASE, AMYLASE in the last 168 hours. No results for input(s): AMMONIA in the last 168 hours.  Coagulation Profile: No results for input(s): INR, PROTIME in the last 168 hours.  Cardiac Enzymes: No results for input(s): CKTOTAL, CKMB, CKMBINDEX, TROPONINI in the last 168 hours.  BNP (last 3 results) No results for input(s): PROBNP in the last 8760 hours.  Lipid Profile: No results for input(s): CHOL, HDL, LDLCALC, TRIG, CHOLHDL, LDLDIRECT in the last 72 hours.  Thyroid  Function Tests: Recent Labs    06/08/24 0931  TSH 50.430*    Anemia Panel: No results for input(s): VITAMINB12, FOLATE, FERRITIN, TIBC, IRON, RETICCTPCT  in the last 72 hours.  Urine analysis:    Component Value Date/Time   COLORURINE YELLOW 06/10/2021 2046   APPEARANCEUR CLEAR 06/10/2021 2046   LABSPEC 1.011 06/10/2021 2046   PHURINE 5.0 06/10/2021 2046   GLUCOSEU NEGATIVE 06/10/2021 2046   GLUCOSEU NEGATIVE 06/29/2015 0903   HGBUR SMALL (A) 06/10/2021 2046   BILIRUBINUR NEGATIVE 06/10/2021 2046   KETONESUR NEGATIVE 06/10/2021 2046   PROTEINUR NEGATIVE 06/10/2021 2046   UROBILINOGEN 0.2 06/29/2015 0903   NITRITE NEGATIVE 06/10/2021 2046   LEUKOCYTESUR LARGE (A) 06/10/2021 2046    Sepsis Labs: Lactic Acid, Venous No results found for: LATICACIDVEN  MICROBIOLOGY: No results found for this or any previous visit (from the past 240 hours).  RADIOLOGY STUDIES/RESULTS: CT Head Wo Contrast Result Date: 06/08/2024 EXAM: CT HEAD WITHOUT CONTRAST 06/08/2024 10:25:47 AM TECHNIQUE: CT of the head was performed without the administration of intravenous contrast. Automated exposure control, iterative reconstruction, and/or weight based adjustment of the mA/kV was utilized to reduce the radiation dose to as low as reasonably achievable. COMPARISON: None available. CLINICAL HISTORY: 68 year old female with shortness of breath, agitation, and altered mental status. FINDINGS: BRAIN AND VENTRICLES: No acute hemorrhage. No evidence of acute infarct. No hydrocephalus. No extra-axial collection. No mass effect or midline shift. Brain volume within normal limits for age. Moderate for age patchy periventricular white matter hypodensity, nonspecific but most commonly due to small vessel disease. Calcified atherosclerosis at the skull base. No suspicious intracranial vascular hyperdensity. ORBITS: No acute abnormality. SINUSES: Paranasal sinuses, middle ears and mastoids are clear. SOFT TISSUES AND SKULL: No acute soft tissue abnormality. No skull fracture. Mild hyperostosis of the calvarium, normal variant. IMPRESSION: 1. No acute intracranial abnormality. 2.  Moderate for age white matter changes, most commonly due to small vessel disease. Electronically signed by: Helayne Hurst MD 06/08/2024 10:31 AM EST RP Workstation: [id number]   DG Chest Port 1 View Result Date: 06/08/2024 EXAM: 1 VIEW XRAY OF THE CHEST 06/08/2024 10:10:00 AM COMPARISON: None available. CLINICAL HISTORY: 06/25/13 FINDINGS: LUNGS AND PLEURA: No focal pulmonary opacity. No pulmonary edema. No pleural effusion. No pneumothorax. HEART AND MEDIASTINUM: No acute abnormality of the cardiac and mediastinal silhouettes. BONES AND SOFT TISSUES: No acute fracture or destructive lesion. Multilevel thoracic osteophytosis. IMPRESSION: 1. No acute cardiopulmonary process. Electronically signed by: Waddell Calk MD 06/08/2024 10:28 AM EST RP Workstation: [id number]     LOS: 1 day   Fairy Pool, DO Internal Medicine  Resident, PGY-3 9:03 AM 06/10/2024    To contact the attending provider between 7A-7P or the covering provider during after hours 7P-7A, please log into the web site [email or web address] and access using universal [place] password for that web site. If you do not have the password, please call the hospital operator.  06/10/2024, 9:03 AM

## 2024-06-10 NOTE — Evaluation (Signed)
 Occupational Therapy Evaluation Patient Details Name: Debra Watkins MRN: [id number] DOB: [birth year] Today's Date: 06/10/2024   History of Present Illness   The pt is a 68 yo female presenting 11/8 with SOB. Work up revealed: AKI and severe hypothyroidism. PMH includes: HTN, HLD, DM II, hypothyroidism, anxiety, obesity (BMI 35), reactive airway disease, lumbar stenosis, and chronic pain.     Clinical Impressions Prior to this admission, patient living alone, and sitting in her lift chair and minimally moving. Patient endorses inability to care for herself and skipping meals as she was unable to move. Currently, patient with lower BP (see below), cognitive deficits, weakness, chronic back pain, and decreased activity tolerance. Patient with need for mod A for bed mobility, and mod A of 2 to come up into standing. Patient with flexed posture, and unable to remain standing for more than 20 seconds. Patient able to scoot towards [facility] with min A and set up in elevated position at end of session. OT recommending lesser intensity rehab < 3 hours prior to discharge, OT will continue to follow.   BP 90/54 (63) in supine  BP 83/64 (72) in sitting after initial transition to EOB  BP 98/51 (62) after 2 standing attempts     If plan is discharge home, recommend the following:   Two people to help with walking and/or transfers;A lot of help with bathing/dressing/bathroom;Assistance with cooking/housework;Direct supervision/assist for medications management;Direct supervision/assist for financial management;Assist for transportation;Help with stairs or ramp for entrance;Supervision due to cognitive status     Functional Status Assessment   Patient has had a recent decline in their functional status and demonstrates the ability to make significant improvements in function in a reasonable and predictable amount of time.     Equipment Recommendations   Other (comment) (defer to next venue)      Recommendations for Other Services         Precautions/Restrictions   Precautions Precautions: Fall Recall of Precautions/Restrictions: Intact Precaution/Restrictions Comments: watch hypotension Restrictions Weight Bearing Restrictions Per Provider Order: No     Mobility Bed Mobility Overal bed mobility: Needs Assistance Bed Mobility: Supine to Sit, Sit to Supine     Supine to sit: Mod assist, HOB elevated, Used rails Sit to supine: Mod assist, Used rails, +2 for physical assistance   General bed mobility comments: mod A to elevate trunk and scoot to EOB. modA of 2 to return BLE to bed and reposition in bed, with mobility assisting with trunk    Transfers Overall transfer level: Needs assistance   Transfers: Sit to/from Stand Sit to Stand: Mod assist, +2 physical assistance, +2 safety/equipment          Lateral/Scoot Transfers: Min assist General transfer comment: patient able to complete sit<>stands x2, with flexed posture and increased commands to attempt, patient unable to static stand for longer than 20 seconds, then scooting to [facility] with min A      Balance Overall balance assessment: Needs assistance Sitting-balance support: No upper extremity supported, Feet supported Sitting balance-Leahy Scale: Fair Sitting balance - Comments: prefers BUE support, can maintain without UE support but mild R lateral lean Postural control: Right lateral lean Standing balance support: Bilateral upper extremity supported, During functional activity, Reliant on assistive device for balance Standing balance-Leahy Scale: Poor Standing balance comment: reliant on external assist                           ADL either performed or assessed  with clinical judgement   ADL Overall ADL's : Needs assistance/impaired Eating/Feeding: Set up;Sitting   Grooming: Set up;Sitting   Upper Body Bathing: Contact guard assist;Sitting   Lower Body Bathing: Maximal assistance;Total  assistance;Sit to/from stand;Sitting/lateral leans   Upper Body Dressing : Contact guard assist;Sitting   Lower Body Dressing: Maximal assistance;Total assistance;Sit to/from stand;Sitting/lateral leans   Toilet Transfer: Moderate assistance;+2 for safety/equipment;+2 for physical assistance;Stand-pivot;BSC/3in1;Rolling walker (2 wheels)   Toileting- Clothing Manipulation and Hygiene: Total assistance;Sitting/lateral lean;Sit to/from stand;+2 for physical assistance;+2 for safety/equipment       Functional mobility during ADLs: Moderate assistance;+2 for physical assistance;+2 for safety/equipment;Cueing for safety;Cueing for sequencing;Rolling walker (2 wheels) General ADL Comments: Prior to this admission, patient living alone, and sitting in her lift chair and minimally moving. Patient endorses inability to care for herself and skipping meals as she was unable to move. Currently, patient with lower BP (see below), cognitive deficits, weakness, chronic back pain, and decreased activity tolerance. Patient with need for mod A for bed mobility, and mod A of 2 to come up into standing. Patient with flexed posture, and unable to remain standing for more than 20 seconds. Patient able to scoot towards [facility] with min A and set up in elevated position at end of session. OT recommending lesser intensity rehab < 3 hours prior to discharge, OT will continue to follow.     Vision Baseline Vision/History: 1 Wears glasses Ability to See in Adequate Light: 0 Adequate Patient Visual Report: No change from baseline Vision Assessment?: No apparent visual deficits     Perception Perception: Not tested       Praxis Praxis: Not tested       Pertinent Vitals/Pain Pain Assessment Pain Assessment: Faces Faces Pain Scale: Hurts little more Pain Location: back (chronic, everywhere) Pain Descriptors / Indicators: Discomfort Pain Intervention(s): Limited activity within patient's tolerance, Monitored during  session, Repositioned     Extremity/Trunk Assessment Upper Extremity Assessment Upper Extremity Assessment: Generalized weakness;Right hand dominant   Lower Extremity Assessment Lower Extremity Assessment: Defer to PT evaluation   Cervical / Trunk Assessment Cervical / Trunk Assessment: Other exceptions Cervical / Trunk Exceptions: large body habitus   Communication Communication Communication: No apparent difficulties   Cognition Arousal: Alert Behavior During Therapy: Flat affect Cognition: Cognition impaired     Awareness: Intellectual awareness impaired, Online awareness impaired Memory impairment (select all impairments): Short-term memory, Working memory Attention impairment (select first level of impairment): Focused attention Executive functioning impairment (select all impairments): Problem solving, Initiation, Organization, Sequencing OT - Cognition Comments: Slowed responses to all questions, is oriented, but often stating, not worth a [place]ink[place] when a question was posed                 Following commands: Impaired Following commands impaired: Follows one step commands inconsistently, Follows one step commands with increased time     Cueing  General Comments   Cueing Techniques: Verbal cues  BP 90/54 (63) in supine 83/64 (72) in sitting and 98/51 (62) after 2 standing attempts   Exercises     Shoulder Instructions      Home Living Family/patient expects to be discharged to:: Private residence Living Arrangements: Alone Available Help at Discharge:  (brother that does not help) Type of Home: Apartment Home Access: Level entry     Home Layout: One level     Bathroom Shower/Tub: Chief Strategy Officer: Handicapped height     Home Equipment: Rollator (4 wheels);Lift chair  Prior Functioning/Environment Prior Level of Function : Independent/Modified Independent             Mobility Comments: pt reports use of  rollator in the home, largely sedentary in lift chair, walking ~20 ft at a time ADLs Comments: reports independent due to lack of assist, but difficulty due to poor endurance, reports skipping meals due to difficulty cooking    OT Problem List: Decreased range of motion;Decreased strength;Decreased activity tolerance;Impaired balance (sitting and/or standing);Decreased coordination;Decreased cognition;Decreased safety awareness;Decreased knowledge of use of DME or AE;Decreased knowledge of precautions;Cardiopulmonary status limiting activity;Obesity;Pain   OT Treatment/Interventions: Self-care/ADL training;Therapeutic exercise;Energy conservation;DME and/or AE instruction;Manual therapy;Therapeutic activities;Patient/family education;Balance training;Cognitive remediation/compensation      OT Goals(Current goals can be found in the care plan section)   Acute Rehab OT Goals Patient Stated Goal: to get better OT Goal Formulation: With patient Time For Goal Achievement: 06/24/24 Potential to Achieve Goals: Fair ADL Goals Pt Will Perform Lower Body Bathing: with min assist;sitting/lateral leans;sit to/from stand Pt Will Perform Lower Body Dressing: with min assist;sitting/lateral leans;sit to/from stand Pt Will Transfer to Toilet: with min assist;stand pivot transfer;bedside commode Pt Will Perform Toileting - Clothing Manipulation and hygiene: with min assist;sitting/lateral leans;sit to/from stand Pt/caregiver will Perform Home Exercise Program: Increased ROM;Both right and left upper extremity;With written HEP provided;With theraband;With minimal assist   OT Frequency:  Min 2X/week    Co-evaluation              AM-PAC OT 6 Clicks Daily Activity     Outcome Measure Help from another person eating meals?: A Little Help from another person taking care of personal grooming?: A Little Help from another person toileting, which includes using toliet, bedpan, or urinal?: A Lot Help  from another person bathing (including washing, rinsing, drying)?: A Lot Help from another person to put on and taking off regular upper body clothing?: A Little Help from another person to put on and taking off regular lower body clothing?: Total 6 Click Score: 14   End of Session Equipment Utilized During Treatment: Gait belt;Rolling walker (2 wheels) Nurse Communication: Mobility status  Activity Tolerance: Patient tolerated treatment well Patient left: in bed;with call bell/phone within reach;with bed alarm set  OT Visit Diagnosis: Unsteadiness on feet (R26.81);Other abnormalities of gait and mobility (R26.89);Muscle weakness (generalized) (M62.81);Other symptoms and signs involving cognitive function;Pain Pain - part of body:  (Back)                Time: 8853-8791 OT Time Calculation (min): 22 min Charges:  OT General Charges $OT Visit: 1 Visit OT Evaluation $OT Eval Moderate Complexity: 1 Mod  Ronal Gift E. Kyce Ging, OTR/L Acute Rehabilitation Services [phone]   Ronal Gift Salt 06/10/2024, 12:38 PM

## 2024-06-11 DIAGNOSIS — E440 Moderate protein-calorie malnutrition: Secondary | ICD-10-CM | POA: Diagnosis not present

## 2024-06-11 DIAGNOSIS — N179 Acute kidney failure, unspecified: Secondary | ICD-10-CM | POA: Diagnosis not present

## 2024-06-11 DIAGNOSIS — I10 Essential (primary) hypertension: Secondary | ICD-10-CM | POA: Diagnosis not present

## 2024-06-11 DIAGNOSIS — E782 Mixed hyperlipidemia: Secondary | ICD-10-CM | POA: Diagnosis not present

## 2024-06-11 LAB — PHOSPHORUS: Phosphorus: 3.6 mg/dL (ref 2.5–4.6)

## 2024-06-11 LAB — GLUCOSE, CAPILLARY
Glucose-Capillary: 97 mg/dL (ref 70–99)
Glucose-Capillary: 153 mg/dL — ABNORMAL HIGH (ref 70–99)
Glucose-Capillary: 105 mg/dL — ABNORMAL HIGH (ref 70–99)
Glucose-Capillary: 165 mg/dL — ABNORMAL HIGH (ref 70–99)

## 2024-06-11 LAB — COMPREHENSIVE METABOLIC PANEL WITH GFR
ALT: 24 U/L (ref 0–44)
AST: 39 U/L (ref 15–41)
Albumin: 2.5 g/dL — ABNORMAL LOW (ref 3.5–5.0)
Alkaline Phosphatase: 70 U/L (ref 38–126)
Anion gap: 12 (ref 5–15)
BUN: 15 mg/dL (ref 8–23)
CO2: 23 mmol/L (ref 22–32)
Calcium: 8.4 mg/dL — ABNORMAL LOW (ref 8.9–10.3)
Chloride: 100 mmol/L (ref 98–111)
Creatinine, Ser: 1.19 mg/dL — ABNORMAL HIGH (ref 0.44–1.00)
GFR, Estimated: 50 mL/min — ABNORMAL LOW (ref >60)
Glucose, Bld: 90 mg/dL (ref 70–99)
Potassium: 3.5 mmol/L (ref 3.5–5.1)
Sodium: 135 mmol/L (ref 135–145)
Total Bilirubin: 0.8 mg/dL (ref 0.0–1.2)
Total Protein: 5.4 g/dL — ABNORMAL LOW (ref 6.5–8.1)

## 2024-06-11 LAB — MAGNESIUM: Magnesium: 2.2 mg/dL (ref 1.7–2.4)

## 2024-06-11 MED ORDER — MIDODRINE HCL 5 MG PO TABS
5.0000 mg | ORAL_TABLET | Freq: Three times a day (TID) | ORAL | Status: DC
  Administered 2024-06-11 – 2024-06-12 (×5): 5 mg via ORAL
  Filled 2024-06-11 (×5): qty 1

## 2024-06-11 MED ORDER — POTASSIUM CHLORIDE CRYS ER 20 MEQ PO TBCR
40.0000 meq | EXTENDED_RELEASE_TABLET | Freq: Two times a day (BID) | ORAL | Status: AC
  Administered 2024-06-11 (×2): 40 meq via ORAL
  Filled 2024-06-11 (×2): qty 2

## 2024-06-11 NOTE — Progress Notes (Signed)
   06/11/24 1010  Mobility  Activity Dangled on edge of bed;Moved bed into chair position  Level of Assistance Maximum assist, patient does 25-49%  Assistive Device Other (Comment) (Bedrail)  Activity Response Tolerated fair  Mobility Referral Yes  Mobility visit 1 Mobility  Mobility Specialist Start Time (ACUTE ONLY) 1010  Mobility Specialist Stop Time (ACUTE ONLY) 1033  Mobility Specialist Time Calculation (min) (ACUTE ONLY) 23 min   Mobility Specialist: Progress Note  Pre-Mobility:      BP 125/74 (88) During Mobility: BP 123/67 (81) Post-Mobility:    BP 109/65 (77)  Pt agreeable to mobility session - received in bed. C/o dizziness - RN notified via securechat. Returned to chair position in bed with all needs met - call bell within reach. Bed alarm on.   Additional comments:  Debra Watkins, BS Mobility Specialist Please contact via SecureChat or  Rehab office at [phone].

## 2024-06-11 NOTE — Progress Notes (Signed)
 PROGRESS NOTE        PATIENT DETAILS Name: Debra Watkins Age: 68 y.o. Sex: female Date of Birth: [birth year] Admit Date: 06/08/2024 Admitting Physician Marsa KATHEE Scurry, MD ERE:Almwd, Glade PARAS, MD  Brief Summary: Patient is a 68 y.o.  female with history of HTN, HLD, DM-2, hypothyroidism, anxiety, bronchial asthma, lumbar spinal stenosis with chronic pain syndrome (on narcotics) who presented to the ED with several weeks history of fatigue/malaise.  She apparently was sitting on a recliner for the past several days and not able to get up-she was able to call EMS who found her covered with stool/urine.  She was subsequently brought to the ED-found to have mild AKI/hyperkalemia/hyponatremia-and subsequently found to have severe hypothyroidism (not taking any of her medications including Synthroid  for at least a couple of months)   Significant events: 11/8>> admit to [facility]   Significant studies: 11/8>> no acute cardiopulmonary process 11/8>> CT head: No acute intracranial abnormality 11/8>> TSH: 50.4 (elevated) 11/10>> AM cortisol 22 (appropriate)   Significant microbiology data: None   Procedures: None   Consults: None  Subjective: Lying in bed, continues to have fatigue but improving since admission. No new or worsening concerns this morning. [facility].   Objective: Vitals: Blood pressure 98/64, pulse 66, temperature 97.7 F (36.5 C), temperature source Oral, resp. rate 16, height 5' 3 (1.6 m), weight 90.7 kg, SpO2 93%.   Exam: Constitutional: Tired and chronically ill appearing elderly female laying in bed. In no acute distress. Cardio:Regular rate and rhythm. Pulm:Clear to auscultation bilaterally in the anterior fields. Normal work of breathing on room air. Abdomen: Soft, non-tender, non-distended, positive bowel sounds FDX:umjrz LE edema Skin:Warm and dry. Neuro:Alert and oriented x3. Moves all 4 extremities against  gravity.  Pertinent Labs/Radiology:    Latest Ref Rng & Units 06/09/2024    4:32 AM 06/08/2024    9:51 AM 05/23/2024    3:30 AM  CBC  WBC 4.0 - 10.5 K/uL 5.0  5.3  4.8   Hemoglobin 12.0 - 15.0 g/dL 87.3  85.3  85.4   Hematocrit 36.0 - 46.0 % 35.6  42.0  44.8   Platelets 150 - 400 K/uL 134  196  224     Lab Results  Component Value Date   NA 135 06/11/2024   K 3.5 06/11/2024   CL 100 06/11/2024   CO2 23 06/11/2024      Assessment/Plan: Severe hypothyroidism Likely the cause of her generalized fatigue/malaise for the past several months This is secondary to noncompliance-stopped taking her Synthroid  for at least 2 months Resumed Synthroid  11/9- continued slow improvement  AKI Secondary to dehydration from poor oral intake and diarrhea, resolved For at least a couple of days PTA-not been able to move around-and mostly lying in a recliner-with very poor oral intake. Renal function improved and stable, continue with oral hydration   Hypokalemia Secondary to GI loss which have improved Replete/recheck, 3.5 this AM  Hypomagnesemia Repleted   Borderline hypotension Probably due to diarrhea which has resolved, AM cortisol 22, BP had been normal but lower yesterday afternoon Stopped metoprolol , continue to hold  Added midodrine with holding parameters   Lower extremity edema Appears minimally pitting Likely secondary to hypothyroidism Hold Lasix  due to soft blood pressure/AKI/hypokalemia   Mild hyperbilirubinemia Unclear significance-liver enzymes stable, Tbili WNL now   HTN  Holding all antihypertensives, BP still low to normal Continue to monitor and add back when appropriate  Hypophosphatemia 3.6 this AM after repletion    HLD Statin   DM-2 (A1c 6.8 on 6/2) CBG stable with SSI   Bronchial asthma Not in exacerbation Continue bronchodilators   Lumbar spinal stenosis/chronic pain syndrome on oral narcotics at home Continue Neurontin /Norco   Anxiety  disorder Supportive care   Debility/deconditioning At baseline walks with walker/cane-much weaker than usual baseline-sat on a recliner for at least 1-2 days covered in feces/urine.  Suspect significant deconditioning is from severe hypothyroidism Will need SNF- [facility]   Other issues Lives alone-apparently has no children-has a brother that she has some very irregular contact with-has not spoken with him apparently in a few months.  Found sitting on a recliner-probably was on the recliner for several days-covered in urine/feces.  Await PT/OT/social work eval-currently not safe for discharge home   Class II obesity: Estimated body mass index is 35.43 kg/m as calculated from the following:   Height as of this encounter: 5' 3 (1.6 m).   Weight as of this encounter: 90.7 kg.   Nutrition Status: Nutrition Problem: Moderate Malnutrition Etiology: chronic illness Signs/Symptoms: percent weight loss, moderate muscle depletion Percent weight loss: 15.6 % Interventions: MVI, Ensure Enlive (each supplement provides 350kcal and 20 grams of protein), Magic cup, Liberalize Diet, Refer to RD note for recommendations    Code status:   Code Status: Full Code   DVT Prophylaxis: enoxaparin (LOVENOX) injection 40 mg Start: 06/08/24 2000     Family Communication: None at bedside   Disposition Plan: Status is: Inpatient Remains inpatient appropriate because: Severity of disease   Planned Discharge Destination:Skilled nursing facility   Diet: Diet Order             Diet regular Room service appropriate? Yes with Assist; Fluid consistency: Thin  Diet effective now                     Antimicrobial agents: Anti-infectives (From admission, onward)    None        MEDICATIONS: Scheduled Meds:  enoxaparin (LOVENOX) injection  40 mg Subcutaneous Q24H   feeding supplement  237 mL Oral BID BM   gabapentin   300 mg Oral BID   Gerhardt's butt cream   Topical TID    insulin aspart  0-9 Units Subcutaneous TID WC   levothyroxine   112 mcg Oral Q0600   midodrine  5 mg Oral TID WC   multivitamin with minerals  1 tablet Oral Daily   rosuvastatin   5 mg Oral Q M,W,F   sodium chloride flush  3 mL Intravenous Q12H   thiamine  100 mg Oral Daily   Continuous Infusions:   PRN Meds:.acetaminophen  **OR** acetaminophen , albuterol , HYDROcodone -acetaminophen , polyethylene glycol   I have personally reviewed following labs and imaging studies  LABORATORY DATA: CBC: Recent Labs  Lab 06/08/24 0951 06/09/24 0432  WBC 5.3 5.0  NEUTROABS 3.5  --   HGB 14.6 12.6  HCT 42.0 35.6*  MCV 85.4 85.0  PLT 196 134*    Basic Metabolic Panel: Recent Labs  Lab 06/08/24 0951 06/08/24 1835 06/09/24 0432 06/10/24 0413 06/11/24 0409  NA 132*  --  132* 133* 135  K 3.2*  --  3.2* 4.3 3.5  CL 90*  --  95* 100 100  CO2 24  --  22 24 23   GLUCOSE 126*  --  122* 115* 90  BUN 14  --  14 14 15   CREATININE 1.41*  --  1.24* 1.20* 1.19*  CALCIUM  9.1  --  8.7* 8.6* 8.4*  MG  --  2.0  --  1.7 2.2  PHOS  --   --   --  1.9* 3.6    GFR: Estimated Creatinine Clearance: 49 mL/min (A) (by C-G formula based on SCr of 1.19 mg/dL (H)).  Liver Function Tests: Recent Labs  Lab 06/08/24 0951 06/09/24 0432 06/10/24 0413 06/11/24 0409  AST 44* 39 45* 39  ALT 28 23 26 24   ALKPHOS 92 77 75 70  BILITOT 1.9* 1.7* 1.2 0.8  PROT 7.0 6.0* 5.8* 5.4*  ALBUMIN 3.4* 2.8* 2.8* 2.5*   No results for input(s): LIPASE, AMYLASE in the last 168 hours. No results for input(s): AMMONIA in the last 168 hours.  Coagulation Profile: No results for input(s): INR, PROTIME in the last 168 hours.  Cardiac Enzymes: No results for input(s): CKTOTAL, CKMB, CKMBINDEX, TROPONINI in the last 168 hours.  BNP (last 3 results) No results for input(s): PROBNP in the last 8760 hours.  Lipid Profile: No results for input(s): CHOL, HDL, LDLCALC, TRIG, CHOLHDL, LDLDIRECT in  the last 72 hours.  Thyroid  Function Tests: Recent Labs    06/08/24 0931  TSH 50.430*    Anemia Panel: No results for input(s): VITAMINB12, FOLATE, FERRITIN, TIBC, IRON, RETICCTPCT in the last 72 hours.  Urine analysis:    Component Value Date/Time   COLORURINE AMBER (A) 06/10/2024 1925   APPEARANCEUR CLEAR 06/10/2024 1925   LABSPEC 1.021 06/10/2024 1925   PHURINE 5.0 06/10/2024 1925   GLUCOSEU NEGATIVE 06/10/2024 1925   GLUCOSEU NEGATIVE 06/29/2015 0903   HGBUR NEGATIVE 06/10/2024 1925   BILIRUBINUR NEGATIVE 06/10/2024 1925   KETONESUR NEGATIVE 06/10/2024 1925   PROTEINUR NEGATIVE 06/10/2024 1925   UROBILINOGEN 0.2 06/29/2015 0903   NITRITE NEGATIVE 06/10/2024 1925   LEUKOCYTESUR NEGATIVE 06/10/2024 1925    Sepsis Labs: Lactic Acid, Venous No results found for: LATICACIDVEN  MICROBIOLOGY: No results found for this or any previous visit (from the past 240 hours).  RADIOLOGY STUDIES/RESULTS: No results found.    LOS: 2 days   Fairy Pool, DO Internal Medicine Resident, PGY-3 7:43 AM 06/11/2024    To contact the attending provider between 7A-7P or the covering provider during after hours 7P-7A, please log into the web site [email or web address] and access using universal [place] password for that web site. If you do not have the password, please call the hospital operator.  06/11/2024, 7:43 AM

## 2024-06-11 NOTE — Plan of Care (Signed)

## 2024-06-11 NOTE — TOC Progression Note (Addendum)
 Transition of Care [facility]) - Progression Note    Patient Details  Name: Debra Watkins MRN: [id number] Date of Birth: [birth year]  Transition of Care [facility]) CM/SW Contact  Inocente GORMAN Kindle, LCSW Phone Number: 06/11/2024, 12:25 PM  Clinical Narrative:    CSW met with patient who has selected [facility] (CSW also presented additional bed offers closer to patient's brother but patient declined). [name] is able to offer a bed pending insurance approval. Patient declined for CSW to contact her brother. CSW submitted clinicals for insurance and received approval, Ref# [id number], effective 06/12/2024-06/14/2024.    Expected Discharge Plan: Skilled Nursing Facility Barriers to Discharge: Continued Medical Work up, SNF Pending bed offer, Insurance Authorization               Expected Discharge Plan and Services       Living arrangements for the past 2 months: Apartment                                       Social Drivers of Health (SDOH) Interventions SDOH Screenings   Food Insecurity: No Food Insecurity (06/08/2024)  Housing: Low Risk  (06/08/2024)  Transportation Needs: No Transportation Needs (06/08/2024)  Utilities: Not At Risk (06/08/2024)  Alcohol Screen: Low Risk  (12/27/2023)  Depression (PHQ2-9): Low Risk  (12/27/2023)  Financial Resource Strain: Low Risk  (12/27/2023)  Physical Activity: Inactive (12/27/2023)  Social Connections: Socially Isolated (06/08/2024)  Stress: No Stress Concern Present (12/27/2023)  Tobacco Use: Low Risk  (05/23/2024)  Health Literacy: Adequate Health Literacy (12/27/2023)    Readmission Risk Interventions     No data to display

## 2024-06-11 NOTE — Plan of Care (Signed)
   Problem: Education: Goal: Ability to describe self-care measures that may prevent or decrease complications (Diabetes Survival Skills Education) will improve Outcome: Progressing Goal: Individualized Educational Video(s) Outcome: Progressing   Problem: Coping: Goal: Ability to adjust to condition or change in health will improve Outcome: Progressing

## 2024-06-12 DIAGNOSIS — J4520 Mild intermittent asthma, uncomplicated: Secondary | ICD-10-CM | POA: Diagnosis not present

## 2024-06-12 DIAGNOSIS — G894 Chronic pain syndrome: Secondary | ICD-10-CM | POA: Diagnosis present

## 2024-06-12 DIAGNOSIS — J45909 Unspecified asthma, uncomplicated: Secondary | ICD-10-CM | POA: Diagnosis not present

## 2024-06-12 DIAGNOSIS — G9340 Encephalopathy, unspecified: Secondary | ICD-10-CM | POA: Diagnosis not present

## 2024-06-12 DIAGNOSIS — Z9049 Acquired absence of other specified parts of digestive tract: Secondary | ICD-10-CM | POA: Diagnosis not present

## 2024-06-12 DIAGNOSIS — Z880 Allergy status to penicillin: Secondary | ICD-10-CM | POA: Diagnosis not present

## 2024-06-12 DIAGNOSIS — D649 Anemia, unspecified: Secondary | ICD-10-CM | POA: Diagnosis present

## 2024-06-12 DIAGNOSIS — R4182 Altered mental status, unspecified: Secondary | ICD-10-CM | POA: Diagnosis not present

## 2024-06-12 DIAGNOSIS — Z91030 Bee allergy status: Secondary | ICD-10-CM | POA: Diagnosis not present

## 2024-06-12 DIAGNOSIS — E782 Mixed hyperlipidemia: Secondary | ICD-10-CM | POA: Diagnosis not present

## 2024-06-12 DIAGNOSIS — M25561 Pain in right knee: Secondary | ICD-10-CM | POA: Diagnosis present

## 2024-06-12 DIAGNOSIS — M6281 Muscle weakness (generalized): Secondary | ICD-10-CM | POA: Diagnosis not present

## 2024-06-12 DIAGNOSIS — R41841 Cognitive communication deficit: Secondary | ICD-10-CM | POA: Diagnosis not present

## 2024-06-12 DIAGNOSIS — E039 Hypothyroidism, unspecified: Secondary | ICD-10-CM | POA: Diagnosis present

## 2024-06-12 DIAGNOSIS — A419 Sepsis, unspecified organism: Secondary | ICD-10-CM | POA: Diagnosis not present

## 2024-06-12 DIAGNOSIS — R456 Violent behavior: Secondary | ICD-10-CM | POA: Diagnosis not present

## 2024-06-12 DIAGNOSIS — F411 Generalized anxiety disorder: Secondary | ICD-10-CM | POA: Diagnosis present

## 2024-06-12 DIAGNOSIS — E7800 Pure hypercholesterolemia, unspecified: Secondary | ICD-10-CM | POA: Diagnosis present

## 2024-06-12 DIAGNOSIS — Z794 Long term (current) use of insulin: Secondary | ICD-10-CM | POA: Diagnosis not present

## 2024-06-12 DIAGNOSIS — M48061 Spinal stenosis, lumbar region without neurogenic claudication: Secondary | ICD-10-CM | POA: Diagnosis present

## 2024-06-12 DIAGNOSIS — Z79890 Hormone replacement therapy (postmenopausal): Secondary | ICD-10-CM | POA: Diagnosis not present

## 2024-06-12 DIAGNOSIS — N3090 Cystitis, unspecified without hematuria: Secondary | ICD-10-CM | POA: Diagnosis not present

## 2024-06-12 DIAGNOSIS — N390 Urinary tract infection, site not specified: Secondary | ICD-10-CM | POA: Diagnosis not present

## 2024-06-12 DIAGNOSIS — E1169 Type 2 diabetes mellitus with other specified complication: Secondary | ICD-10-CM | POA: Diagnosis not present

## 2024-06-12 DIAGNOSIS — R000 Tachycardia, unspecified: Secondary | ICD-10-CM | POA: Diagnosis not present

## 2024-06-12 DIAGNOSIS — R2681 Unsteadiness on feet: Secondary | ICD-10-CM | POA: Diagnosis not present

## 2024-06-12 DIAGNOSIS — S42301D Unspecified fracture of shaft of humerus, right arm, subsequent encounter for fracture with routine healing: Secondary | ICD-10-CM | POA: Diagnosis not present

## 2024-06-12 DIAGNOSIS — G9341 Metabolic encephalopathy: Secondary | ICD-10-CM | POA: Diagnosis present

## 2024-06-12 DIAGNOSIS — N3000 Acute cystitis without hematuria: Secondary | ICD-10-CM | POA: Diagnosis not present

## 2024-06-12 DIAGNOSIS — Z888 Allergy status to other drugs, medicaments and biological substances status: Secondary | ICD-10-CM | POA: Diagnosis not present

## 2024-06-12 DIAGNOSIS — E440 Moderate protein-calorie malnutrition: Secondary | ICD-10-CM | POA: Diagnosis not present

## 2024-06-12 DIAGNOSIS — M1990 Unspecified osteoarthritis, unspecified site: Secondary | ICD-10-CM | POA: Diagnosis not present

## 2024-06-12 DIAGNOSIS — R531 Weakness: Secondary | ICD-10-CM | POA: Diagnosis not present

## 2024-06-12 DIAGNOSIS — N179 Acute kidney failure, unspecified: Secondary | ICD-10-CM | POA: Diagnosis not present

## 2024-06-12 DIAGNOSIS — Z1152 Encounter for screening for COVID-19: Secondary | ICD-10-CM | POA: Diagnosis not present

## 2024-06-12 DIAGNOSIS — Z79899 Other long term (current) drug therapy: Secondary | ICD-10-CM | POA: Diagnosis not present

## 2024-06-12 DIAGNOSIS — R918 Other nonspecific abnormal finding of lung field: Secondary | ICD-10-CM | POA: Diagnosis not present

## 2024-06-12 DIAGNOSIS — E119 Type 2 diabetes mellitus without complications: Secondary | ICD-10-CM | POA: Diagnosis present

## 2024-06-12 DIAGNOSIS — Z923 Personal history of irradiation: Secondary | ICD-10-CM | POA: Diagnosis not present

## 2024-06-12 DIAGNOSIS — Z6835 Body mass index (BMI) 35.0-35.9, adult: Secondary | ICD-10-CM | POA: Diagnosis not present

## 2024-06-12 DIAGNOSIS — Z7401 Bed confinement status: Secondary | ICD-10-CM | POA: Diagnosis not present

## 2024-06-12 DIAGNOSIS — M549 Dorsalgia, unspecified: Secondary | ICD-10-CM | POA: Diagnosis present

## 2024-06-12 DIAGNOSIS — I10 Essential (primary) hypertension: Secondary | ICD-10-CM | POA: Diagnosis present

## 2024-06-12 DIAGNOSIS — M8580 Other specified disorders of bone density and structure, unspecified site: Secondary | ICD-10-CM | POA: Diagnosis not present

## 2024-06-12 DIAGNOSIS — Z751 Person awaiting admission to adequate facility elsewhere: Secondary | ICD-10-CM | POA: Diagnosis not present

## 2024-06-12 DIAGNOSIS — E669 Obesity, unspecified: Secondary | ICD-10-CM | POA: Diagnosis present

## 2024-06-12 DIAGNOSIS — E890 Postprocedural hypothyroidism: Secondary | ICD-10-CM | POA: Diagnosis not present

## 2024-06-12 LAB — GLUCOSE, CAPILLARY
Glucose-Capillary: 137 mg/dL — ABNORMAL HIGH (ref 70–99)
Glucose-Capillary: 161 mg/dL — ABNORMAL HIGH (ref 70–99)

## 2024-06-12 MED ORDER — GERHARDT'S BUTT CREAM: 1.0000 | TOPICAL_CREAM | Freq: Three times a day (TID) | CUTANEOUS | Status: AC

## 2024-06-12 MED ORDER — HYDROCODONE-ACETAMINOPHEN 5-325 MG PO TABS: 1.0000 | ORAL_TABLET | Freq: Three times a day (TID) | ORAL | 0 refills | Status: DC | PRN

## 2024-06-12 MED ORDER — MIDODRINE HCL 5 MG PO TABS
5.0000 mg | ORAL_TABLET | Freq: Three times a day (TID) | ORAL | Status: AC
Start: 2024-06-12 — End: ?

## 2024-06-12 MED ORDER — POLYETHYLENE GLYCOL 3350 17 G PO PACK: 17.0000 g | PACK | Freq: Every day | ORAL | Status: AC | PRN

## 2024-06-12 MED ORDER — ENSURE PLUS HIGH PROTEIN PO LIQD: 237.0000 mL | Freq: Two times a day (BID) | ORAL | Status: AC

## 2024-06-12 MED ORDER — INSULIN ASPART 100 UNIT/ML IJ SOLN: 0.0000 [IU] | Freq: Three times a day (TID) | INTRAMUSCULAR | Status: AC

## 2024-06-12 NOTE — TOC Transition Note (Addendum)
 Transition of Care [facility]) - Discharge Note   Patient Details  Name: Debra Watkins MRN: [id number] Date of Birth: 1956/06/24  Transition of [facility]) CM/SW Contact:  Bernardino Dean, LCSWA Phone Number: 06/12/2024, 12:45 PM   Clinical Narrative:    [facility] will [name] Anticipated DC date: 06/12/24 Family notified: Pt declined Transport by: [facility]   Per MD patient ready for DC to [facility]. RN to call report prior to discharge [phone], room 202). RN, patient, and facility notified of DC. Discharge Summary and FL2 sent to facility. DC packet on chart, including signed rx. Ambulance transport requested for patient.   CSW will sign off for now as social work intervention is no longer needed. Please consult us  again if new needs arise.     Final next level of care: Skilled Nursing Facility Barriers to Discharge: Barriers Resolved   Patient Goals and CMS Choice Patient states their goals for this hospitalization and ongoing recovery are:: Rehab CMS Medicare.gov Compare Post Acute Care list provided to:: Patient Choice offered to / list presented to : Patient [place] ownership interest in [facility].provided to:: Patient    Discharge Placement   Existing PASRR number confirmed : 06/12/24          Patient [facility] Patient to be transferred to facility by: [facility] Name of family member notified: Patient declined Patient and family notified of of transfer: 06/12/24  Discharge Plan and Services Additional resources added to the After Visit Summary for                                       Social Drivers of Health (SDOH) Interventions SDOH Screenings   Food Insecurity: No Food Insecurity (06/08/2024)  Housing: Low Risk  (06/08/2024)  Transportation Needs: No Transportation Needs (06/08/2024)  Utilities: Not At Risk (06/08/2024)  Alcohol Screen: Low Risk  (12/27/2023)  Depression (PHQ2-9): Low Risk  (12/27/2023)  Financial  Resource Strain: Low Risk  (12/27/2023)  Physical Activity: Inactive (12/27/2023)  Social Connections: Socially Isolated (06/08/2024)  Stress: No Stress Concern Present (12/27/2023)  Tobacco Use: Low Risk  (05/23/2024)  Health Literacy: Adequate Health Literacy (12/27/2023)     Readmission Risk Interventions     No data to display

## 2024-06-12 NOTE — Progress Notes (Signed)
 Attempted to call [name] for handoff.Assigned nurse couldn't be reached out.

## 2024-06-12 NOTE — Discharge Instructions (Signed)
 Counseling Resources:  [facility] [address]  [place], [place] [address]        [phone]     [facility]  [phone] [address] [place]  [place], [place] [address] [phone]  [facility] [address], [place], [place]  [phone]    [facility] [address] [place], [place] [phone]  [facility]:  [phone] [address] [place]  [facility]:  [address] [place], [place] [phone]  [facility] (under & uninsured) [address]   [place] [phone]    [email or web address]    [facility] -[address]     Phone:  [phone] [facility]-  [address]  [phone]    [facility] [address] [place], [place] [phone]  *[facility]: [address] [place], [place], [place]   [phone]   [facility] (Spanish therapist) [address] [place]  [address] [place] [address] [phone]    [facility]  [phone]  [address],  [place], [place]    [place] [phone] [address] [place]   [facility] [place]  [address] [place], [place]        [phone]  [facility] [place], [address] [place] [phone]):   Mon - Fri from 8 AM - 3 PM  [facility] the [address], [address] [place], [place]: [phone]) 8:30 - 12; 1 - 2:30 Accepts Medicaid   [facility] [employer], [address] [place], [place] ([phone]):8:30 - 12; 2 - 3PM  [facility] [employer], [address] [place]; [phone]):   Mon - Fri 8 AM - 5 PM  *[facility] [address] [place]  MWF 12:30 to 3:00 or  call to schedule an appointment [phone]  Specific  Provider options Psychology Today  https://www.psychologytoday.com/us click on find a therapist  enter your zip code left side and select or tailor a therapist for your specific need.   [facility] Provider Directory [email or web address]  (Medicaid)  Follow all drop down to find a provider  Social Support program [facility] [place] [phone] or [employer] [address], [place], [place] Recovery support and educational  In home counseling [facility] Telephone: [phone]  office in [facility] [email or web address]   private insurance [place],  health Choice, [place], [place], [place], [place], [place] Health Choice   24- Hour Availability:  [facility]  [phone] or [phone]  [facility] Service of the [facility] [phone]  [facility]  [phone]   [facility]  [phone] (after hours)  Therapeutic Alternative/Mobile Crisis   [phone]  [place] National Suicide Hotline  [phone] Len Child[place])  Call 911 or go to emergency room  [facility]  [phone]);  [place] and [employer]  [phone]); [place], [place], [place], [place], [place], [place], [place]

## 2024-06-12 NOTE — Progress Notes (Signed)
 Physical Therapy Treatment Patient Details Name: Debra Watkins MRN: [id number] DOB: 06-29-1956 Today's Date: 06/12/2024   History of Present Illness The pt is a 68 yo female presenting 11/8 with SOB. Work up revealed: AKI and severe hypothyroidism. PMH includes: HTN, HLD, DM II, hypothyroidism, anxiety, obesity (BMI 35), reactive airway disease, lumbar stenosis, and chronic pain.    PT Comments  Pt required max assist supine to sit, +2 mod assist sit to stand with RW, and +2 mod assist sit to supine. Pt returned to supine at end of session with bed in semi chair position. Current POC remains appropriate.     If plan is discharge home, recommend the following: Two people to help with walking and/or transfers;Two people to help with bathing/dressing/bathroom;Assistance with cooking/housework;Direct supervision/assist for medications management;Direct supervision/assist for financial management;Assist for transportation;Help with stairs or ramp for entrance;Supervision due to cognitive status   Can travel by private vehicle     No  Equipment Recommendations  Wheelchair (measurements PT);Wheelchair cushion (measurements PT)    Recommendations for Other Services       Precautions / Restrictions Precautions Precautions: Fall Recall of Precautions/Restrictions: Intact Precaution/Restrictions Comments: watch hypotension     Mobility  Bed Mobility Overal bed mobility: Needs Assistance Bed Mobility: Supine to Sit, Sit to Supine     Supine to sit: Max assist, HOB elevated Sit to supine: Mod assist, Used rails, +2 for physical assistance   General bed mobility comments: assist for all aspects of mobility, increased time    Transfers Overall transfer level: Needs assistance Equipment used: Rolling walker (2 wheels) Transfers: Sit to/from Stand Sit to Stand: Mod assist, +2 physical assistance, +2 safety/equipment           General transfer comment: STS x 3 trials. Cleared  bottom but maintained flexed position. Sustained ~ 10 sec/trial.    Ambulation/Gait               General Gait Details: unable   Stairs             Wheelchair Mobility     Tilt Bed    Modified Rankin (Stroke Patients Only)       Balance Overall balance assessment: Needs assistance Sitting-balance support: No upper extremity supported, Feet supported Sitting balance-Leahy Scale: Fair     Standing balance support: Bilateral upper extremity supported, During functional activity, Reliant on assistive device for balance Standing balance-Leahy Scale: Poor                              Communication Communication Communication: No apparent difficulties  Cognition Arousal: Alert Behavior During Therapy: Flat affect   PT - Cognitive impairments: No family/caregiver present to determine baseline, Awareness, Problem solving                       PT - Cognition Comments: minimal verbalizations and interaction with therapist Following commands: Impaired Following commands impaired: Follows one step commands with increased time    Cueing Cueing Techniques: Verbal cues, Tactile cues  Exercises      General Comments General comments (skin integrity, edema, etc.): BP 101/61 supine, 95/58 sitting      Pertinent Vitals/Pain Pain Assessment Pain Assessment: Faces Faces Pain Scale: Hurts little more Pain Location: back Pain Descriptors / Indicators: Discomfort Pain Intervention(s): Limited activity within patient's tolerance, Monitored during session    Home Living  Prior Function            PT Goals (current goals can now be found in the care plan section) Acute Rehab PT Goals Patient Stated Goal: not stated Progress towards PT goals: Progressing toward goals    Frequency    Min 2X/week      PT Plan      Co-evaluation              AM-PAC PT 6 Clicks Mobility   Outcome Measure   Help needed turning from your back to your side while in a flat bed without using bedrails?: A Lot Help needed moving from lying on your back to sitting on the side of a flat bed without using bedrails?: A Lot Help needed moving to and from a bed to a chair (including a wheelchair)?: Total Help needed standing up from a chair using your arms (e.g., wheelchair or bedside chair)?: Total Help needed to walk in hospital room?: Total Help needed climbing 3-5 steps with a railing? : Total 6 Click Score: 8    End of Session Equipment Utilized During Treatment: Gait belt Activity Tolerance: Patient tolerated treatment well Patient left: in bed;with call bell/phone within reach;with bed alarm set Nurse Communication: Mobility status PT Visit Diagnosis: Unsteadiness on feet (R26.81);Muscle weakness (generalized) (M62.81);Difficulty in walking, not elsewhere classified (R26.2);Pain     Time: 9083-9065 PT Time Calculation (min) (ACUTE ONLY): 18 min  Charges:    $Therapeutic Activity: 8-22 mins PT General Charges $$ ACUTE PT VISIT: 1 Visit                     Sari MATSU., PT  Office # [phone]    Erven Sari Shaker 06/12/2024, 10:30 AM

## 2024-06-12 NOTE — Discharge Summary (Signed)
 PATIENT DETAILS Name: Debra Watkins Age: 68 y.o. Sex: female Date of Birth: 10-03-1955 MRN: [id number]. Admitting Physician: Marsa KATHEE Scurry, MD ERE:Almwd, Glade PARAS, MD  Admit Date: 06/08/2024 Discharge date: 06/12/2024  Recommendations for Outpatient Follow-up:  Follow up with PCP in 1-2 weeks Please obtain CMP/CBC in one week, TSH in 3-5 weeks Please follow up on the following pending results:  Admitted From:  Home  Disposition: Skilled nursing facility   Discharge Condition: fair  CODE STATUS:   Code Status: Full Code   Diet recommendation:  Diet Order             Diet - low sodium heart healthy           Diet regular Room service appropriate? Yes with Assist; Fluid consistency: Thin  Diet effective now                    Brief Summary: Debra Watkins is a 68 year old female with a history of HTN, T2DM, hypothyroidism, anxiety, bronchial asthma, HLD, and chronic pain from lumbar spinal stenosis on opiate therapy who presented from home with severe hypothyroidism 2/2 non-adherence and mild AKI/hypokalemia/hyponatremia. She was found in her recliner at home after being alone for several days after she was unable to get up. She had stopped taking her levothyroxine  and other medications about 2 months prior. TSH 50.4 on admission, sCr 1.41, K 3.2, and Na 132. No acute or contributing findings on admission head CT w/o, CBC, tox screen, CBG, or UA. She was resumed on levothyroxine  and showed stead but slow improvement. BP remained low normal with MAP 60-65 when sleeping. Midodrine was added with benefit, expect to see BP improve over the next 2-4 weeks back on levothyroxine . She endorsed some severe depressive symptoms but denied any active suicidal ideation. She has had some SI in the past but never has had a plan. She denies having any firearms at home. Discharged to [facility] for subacute rehab.   Brief Hospital Course: Severe hypothyroidism This is  secondary to noncompliance-stopped taking her Synthroid  for at least 2 months Resumed Synthroid  112 mcg daily on 06/09/2024   AKI Secondary to dehydration from poor oral intake and diarrhea For at least a couple of days PTA-not been able to move around-and mostly lying in a recliner-with very poor oral intake. Renal function improved and stable after IVF, continue with oral hydration   Hypokalemia Hypomagnesemia Hypophosphatemia Secondary to GI loss/low PO intake  Stable after repletion    Borderline hypotension Probably due to diarrhea and hypothyoidism AM cortisol 22 Stopped metoprolol , continue to hold  Added midodrine 5 mg TID   Lower extremity edema Appears minimally pitting Likely secondary to hypothyroidism Hold Lasix  due to soft blood pressure/AKI/hypokalemia, may resume PRN   Mild hyperbilirubinemia Unclear significance-liver enzymes stable, Tbili WNL on follow up   HTN Holding all antihypertensives, BP still low to normal, midodrine as above Continue to monitor and add back when appropriate   HLD Statin   DM-2 (A1c 6.8 on 6/2) CBG stable with minimal SSI   Bronchial asthma Not in exacerbation Continue bronchodilators   Lumbar spinal stenosis/chronic pain syndrome on oral narcotics at home Continue Neurontin /Norco   Anxiety disorder Supportive care   Debility/deconditioning Lives alone-apparently has no children-has a brother that she has some very irregular contact with-has not spoken with him apparently in a few months.  Found sitting on a recliner-probably was on the recliner for several days-covered in urine/feces. At  baseline walks with walker/cane-much weaker than usual baseline. Suspect significant deconditioning is from severe hypothyroidism SNF for subacute rehab As noted above-she she seems to be incredibly frustrated with her overall situation-and at times has expressed some suicidal ideation but does not have any active plan.  She does  acknowledge that-some of the statements that she has made-are just out of frustration.  She is being discharged to SNF in a stable condition-if need need-psychiatry evaluation can always be obtained at some point in the future if warranted by attending MD at SNF.  Discharge Diagnoses:  Principal Problem:   AKI (acute kidney injury) Active Problems:   Hypothyroidism following radioiodine therapy   Morbid obesity (HCC)   Anxiety state   Mild reactive airways disease   Essential hypertension, benign   Lumbar spinal stenosis with chronic back pain   Diabetes (HCC)   Hyperlipidemia   Malnutrition of moderate degree   Discharge Instructions: Debra Watkins,  You were recently admitted to [facility] for severe hypothyroidism which caused you to be extremely fatigued and unable to get up from your chair at home. We restarted your medicines and we wish you luck at rehab.  We recommend that you see your primary care doctor in about a week to make sure that you continue to improve. We are so glad that you are feeling better.  Activity:  As tolerated with Full fall precautions use walker/cane & assistance as needed WBAT   Discharge Instructions     Call MD for:  difficulty breathing, headache or visual disturbances   Complete by: As directed    Call MD for:  temperature >100.4   Complete by: As directed    Diet - low sodium heart healthy   Complete by: As directed    Discharge instructions   Complete by: As directed    Follow with Primary MD  Geofm Glade PARAS, MD in 1-2 weeks  Please get a complete blood count and chemistry panel checked by your Primary MD at your next visit, and again as instructed by your Primary MD.  Get Medicines reviewed and adjusted: Please take all your medications with you for your next visit with your Primary MD  Laboratory/radiological data: Please request your Primary MD to go over all hospital tests and procedure/radiological results at the  follow up, please ask your Primary MD to get all Hospital records sent to his/her office.  In some cases, they will be blood work, cultures and biopsy results pending at the time of your discharge. Please request that your primary care M.D. follows up on these results.  Also Note the following: If you experience worsening of your admission symptoms, develop shortness of breath, life threatening emergency, suicidal or homicidal thoughts you must seek medical attention immediately by calling 911 or calling your MD immediately  if symptoms less severe.  You must read complete instructions/literature along with all the possible adverse reactions/side effects for all the Medicines you take and that have been prescribed to you. Take any new Medicines after you have completely understood and accpet all the possible adverse reactions/side effects.   Do not drive when taking Pain medications or sleeping medications (Benzodaizepines)  Do not take more than prescribed Pain, Sleep and Anxiety Medications. It is not advisable to combine anxiety,sleep and pain medications without talking with your primary care practitioner  Special Instructions: If you have smoked or chewed Tobacco  in the last 2 yrs please stop smoking, stop any regular Alcohol  and  or any Recreational drug use.  Wear Seat belts while driving.  Please note: You were cared for by a hospitalist during your hospital stay. Once you are discharged, your primary care physician will handle any further medical issues. Please note that NO REFILLS for any discharge medications will be authorized once you are discharged, as it is imperative that you return to your primary care physician (or establish a relationship with a primary care physician if you do not have one) for your post hospital discharge needs so that they can reassess your need for medications and monitor your lab values.   Discharge wound care:   Complete by: As directed    Cleanse  sacrum/buttocks/perineum/inner thighs and labia with Vashe wound cleanser, do not rinse and allow to air dry. Apply Gerhardt's Butt Cream to entire area 3 times a day and prn soiling.  Can sprinkle over Gerhardt's with floor stock microguard powder (green and white label) for extra drying effect as desired. Would leave silicone foam off skin as will hold moisture onto skin   Increase activity slowly   Complete by: As directed       Allergies as of 06/12/2024       Reactions   Bee Venom Swelling   Tetanus Toxoid Hives   Penicillins Rash        Medication List     PAUSE taking these medications    furosemide  20 MG tablet Wait to take this until your doctor or other care provider tells you to start again. Commonly known as: LASIX  TAKE 1 TABLET BY MOUTH DAILY. TAKE AN EXTRA PILL 1-2 TIMES PER WEEK IF NEEDED FOR SWELLING   metoprolol  tartrate 25 MG tablet Wait to take this until your doctor or other care provider tells you to start again. Commonly known as: LOPRESSOR  TAKE 1/2 TABLET(12.5 MG) BY MOUTH TWICE DAILY What changed: See the new instructions.   Rybelsus  3 MG Tabs Wait to take this until your doctor or other care provider tells you to start again. Generic drug: Semaglutide  Take 1 tablet (3 mg total) by mouth daily.       TAKE these medications    acetaminophen  500 MG tablet Commonly known as: TYLENOL  Take 1,000 mg by mouth 2 (two) times daily as needed for headache or fever (pain).   albuterol  108 (90 Base) MCG/ACT inhaler Commonly known as: VENTOLIN  HFA Inhale 2 puffs into the lungs every 6 (six) hours as needed.   feeding supplement Liqd Take 237 mLs by mouth 2 (two) times daily between meals.   gabapentin  300 MG capsule Commonly known as: NEURONTIN  Take 1 capsule (300 mg total) by mouth 2 (two) times daily.   Gerhardt's butt cream Crea Apply 1 Application topically 3 (three) times daily.   HYDROcodone -acetaminophen  5-325 MG tablet Commonly known as:  NORCO/VICODIN Take 1 tablet by mouth every 8 (eight) hours as needed. What changed:  when to take this reasons to take this additional instructions   insulin aspart 100 UNIT/ML injection Commonly known as: novoLOG Inject 0-9 Units into the skin 3 (three) times daily with meals.   levothyroxine  112 MCG tablet Commonly known as: SYNTHROID  TAKE 1 TABLET(112 MCG) BY MOUTH DAILY   midodrine 5 MG tablet Commonly known as: PROAMATINE Take 1 tablet (5 mg total) by mouth 3 (three) times daily with meals.   Multivitamin Women 50+ Tabs Take 1 tablet by mouth daily.   polyethylene glycol 17 g packet Commonly known as: MIRALAX / GLYCOLAX Take 17 g by  mouth daily as needed for mild constipation.   rosuvastatin  5 MG tablet Commonly known as: CRESTOR  TAKE 1 TABLET(5 MG) BY MOUTH 3 TIMES A WEEK               Discharge Care Instructions  (From admission, onward)           Start     Ordered   06/12/24 0000  Discharge wound care:       Comments: Cleanse sacrum/buttocks/perineum/inner thighs and labia with Vashe wound cleanser, do not rinse and allow to air dry. Apply Gerhardt's Butt Cream to entire area 3 times a day and prn soiling.  Can sprinkle over Gerhardt's with floor stock microguard powder (green and white label) for extra drying effect as desired. Would leave silicone foam off skin as will hold moisture onto skin   06/12/24 0954            Contact information for follow-up providers     Burns, Glade PARAS, MD. Schedule an appointment as soon as possible for a visit in 1 week(s).   Specialty: Internal Medicine Contact information: [address] [place] [address] [phone]              Contact information for after-discharge care     Destination     [facility] .   Service: Skilled Nursing Contact information: [address] [place]  [address] [phone]                    Allergies   Allergen Reactions   Bee Venom Swelling   Tetanus Toxoid Hives   Penicillins Rash     Other Procedures/Studies: CT Head Wo Contrast Result Date: 06/08/2024 EXAM: CT HEAD WITHOUT CONTRAST 06/08/2024 10:25:47 AM TECHNIQUE: CT of the head was performed without the administration of intravenous contrast. Automated exposure control, iterative reconstruction, and/or weight based adjustment of the mA/kV was utilized to reduce the radiation dose to as low as reasonably achievable. COMPARISON: None available. CLINICAL HISTORY: 68 year old female with shortness of breath, agitation, and altered mental status. FINDINGS: BRAIN AND VENTRICLES: No acute hemorrhage. No evidence of acute infarct. No hydrocephalus. No extra-axial collection. No mass effect or midline shift. Brain volume within normal limits for age. Moderate for age patchy periventricular white matter hypodensity, nonspecific but most commonly due to small vessel disease. Calcified atherosclerosis at the skull base. No suspicious intracranial vascular hyperdensity. ORBITS: No acute abnormality. SINUSES: Paranasal sinuses, middle ears and mastoids are clear. SOFT TISSUES AND SKULL: No acute soft tissue abnormality. No skull fracture. Mild hyperostosis of the calvarium, normal variant. IMPRESSION: 1. No acute intracranial abnormality. 2. Moderate for age white matter changes, most commonly due to small vessel disease. Electronically signed by: Helayne Hurst MD 06/08/2024 10:31 AM EST RP Workstation: [id number]   DG Chest Port 1 View Result Date: 06/08/2024 EXAM: 1 VIEW XRAY OF THE CHEST 06/08/2024 10:10:00 AM COMPARISON: None available. CLINICAL HISTORY: 06/25/13 FINDINGS: LUNGS AND PLEURA: No focal pulmonary opacity. No pulmonary edema. No pleural effusion. No pneumothorax. HEART AND MEDIASTINUM: No acute abnormality of the cardiac and mediastinal silhouettes. BONES AND SOFT TISSUES: No acute fracture or destructive lesion. Multilevel thoracic  osteophytosis. IMPRESSION: 1. No acute cardiopulmonary process. Electronically signed by: Waddell Calk MD 06/08/2024 10:28 AM EST RP Workstation: [id number]     TODAY-DAY OF DISCHARGE:  Subjective:   Debra Watkins today has no new or worsening concerns. Feels a little less fatigued and agreeable to plan for  SNF for subacute rehab.  Objective:   Blood pressure 101/61, pulse 90, temperature 98.4 F (36.9 C), temperature source Oral, resp. rate 16, height 5' 3 (1.6 m), weight 90.7 kg, SpO2 92%.  Intake/Output Summary (Last 24 hours) at 06/12/2024 0955 Last data filed at 06/12/2024 0847 Gross per 24 hour  Intake 486 ml  Output 300 ml  Net 186 ml   Filed Weights   06/08/24 0922  Weight: 90.7 kg    Exam: Constitutional: Tired and chronically ill appearing elderly female laying in bed. In no acute distress. Cardio:Regular rate and rhythm. Pulm:Clear to auscultation bilaterally in the anterior fields. Normal work of breathing on room air. Abdomen: Soft, non-tender, non-distended, positive bowel sounds FDX:umjrz LE edema Skin:Warm and dry. Neuro:Alert and oriented x3. Moves all 4 extremities against gravity.   PERTINENT RADIOLOGIC STUDIES: No results found.   PERTINENT LAB RESULTS: CBC: No results for input(s): WBC, HGB, HCT, PLT in the last 72 hours. CMET CMP     Component Value Date/Time   NA 135 06/11/2024 0409   K 3.5 06/11/2024 0409   CL 100 06/11/2024 0409   CO2 23 06/11/2024 0409   GLUCOSE 90 06/11/2024 0409   BUN 15 06/11/2024 0409   CREATININE 1.19 (H) 06/11/2024 0409   CALCIUM  8.4 (L) 06/11/2024 0409   PROT 5.4 (L) 06/11/2024 0409   ALBUMIN 2.5 (L) 06/11/2024 0409   AST 39 06/11/2024 0409   ALT 24 06/11/2024 0409   ALKPHOS 70 06/11/2024 0409   BILITOT 0.8 06/11/2024 0409   GFR 71.04 01/01/2024 1118   GFRNONAA 50 (L) 06/11/2024 0409    GFR Estimated Creatinine Clearance: 49 mL/min (A) (by C-G formula based on SCr of 1.19 mg/dL (H)). No  results for input(s): LIPASE, AMYLASE in the last 72 hours. No results for input(s): CKTOTAL, CKMB, CKMBINDEX, TROPONINI in the last 72 hours. Invalid input(s): POCBNP No results for input(s): DDIMER in the last 72 hours. No results for input(s): HGBA1C in the last 72 hours. No results for input(s): CHOL, HDL, LDLCALC, TRIG, CHOLHDL, LDLDIRECT in the last 72 hours. No results for input(s): TSH, T4TOTAL, T3FREE, THYROIDAB in the last 72 hours.  Invalid input(s): FREET3 No results for input(s): VITAMINB12, FOLATE, FERRITIN, TIBC, IRON, RETICCTPCT in the last 72 hours. Coags: No results for input(s): INR in the last 72 hours.  Invalid input(s): PT Microbiology: No results found for this or any previous visit (from the past 240 hours).  FURTHER DISCHARGE INSTRUCTIONS:  Get Medicines reviewed and adjusted: Please take all your medications with you for your next visit with your Primary MD  Laboratory/radiological data: Please request your Primary MD to go over all hospital tests and procedure/radiological results at the follow up, please ask your Primary MD to get all Hospital records sent to his/her office.  In some cases, they will be blood work, cultures and biopsy results pending at the time of your discharge. Please request that your primary care M.D. goes through all the records of your hospital data and follows up on these results.  Also Note the following: If you experience worsening of your admission symptoms, develop shortness of breath, life threatening emergency, suicidal or homicidal thoughts you must seek medical attention immediately by calling 911 or calling your MD immediately  if symptoms less severe.  You must read complete instructions/literature along with all the possible adverse reactions/side effects for all the Medicines you take and that have been prescribed to you. Take any new Medicines after you have  completely understood and accpet all the possible adverse reactions/side effects.   Do not drive when taking Pain medications or sleeping medications (Benzodaizepines)  Do not take more than prescribed Pain, Sleep and Anxiety Medications. It is not advisable to combine anxiety,sleep and pain medications without talking with your primary care practitioner  Special Instructions: If you have smoked or chewed Tobacco  in the last 2 yrs please stop smoking, stop any regular Alcohol  and or any Recreational drug use.  Wear Seat belts while driving.  Please note: You were cared for by a hospitalist during your hospital stay. Once you are discharged, your primary care physician will handle any further medical issues. Please note that NO REFILLS for any discharge medications will be authorized once you are discharged, as it is imperative that you return to your primary care physician (or establish a relationship with a primary care physician if you do not have one) for your post hospital discharge needs so that they can reassess your need for medications and monitor your lab values.  Total Time spent coordinating discharge including counseling, education and face to face time equals greater than 30 minutes.  SignedBETHA Donalda Applebaum 06/12/2024 9:55 AM

## 2024-06-12 NOTE — Progress Notes (Addendum)
   06/12/24 0912  Mobility  Activity Dangled on edge of bed  Level of Assistance Maximum assist, patient does 25-49%  Assistive Device Other (Comment) (Bedrail)  Activity Response Tolerated fair  Mobility Referral Yes  Mobility visit 1 Mobility  Mobility Specialist Start Time (ACUTE ONLY) 0912  Mobility Specialist Stop Time (ACUTE ONLY) 0919  Mobility Specialist Time Calculation (min) (ACUTE ONLY) 7 min   Mobility Specialist: Progress Note  Pre-Mobility:      HR 90, SpO2 94% RA  Pt agreeable to mobility session - received in bed. C/o back pain and knee pain. Returned to EOB w/ PT present with all needs met - call bell within reach.   Additional comments: MS assisted with pt during PT session. Pt seen for additional visit, c/o knee pain. Denies dizziness besides low BP.   Virgle Boards, BS Mobility Specialist Please contact via SecureChat or  Rehab office at [phone].

## 2024-06-12 NOTE — Hospital Course (Signed)
 Debra Watkins is a 68 year old female with a history of HTN, T2DM, hypothyroidism, anxiety, bronchial asthma, HLD, and chronic pain from lumbar spinal stenosis on opiate therapy who presented from home with severe hypothyroidism 2/2 non-adherence and mild AKI/hypokalemia/hyponatremia. She was found in her recliner at home after being alone for several days after she was unable to get up. She had stopped taking her levothyroxine  and other medications about 2 months prior. TSH 50.4 on admission, sCr 1.41, K 3.2, and Na 132. No acute or contributing findings on admission head CT w/o, CBC, tox screen, CBG, or UA. She was resumed on levothyroxine  and showed stead but slow improvement. BP remained low normal with MAP 60-65 when sleeping. Midodrine was added with benefit, expect to see BP improve over the next 2-4 weeks back on levothyroxine . She endorsed some severe depressive symptoms but denied any active suicidal ideation. She has had some SI in the past but never has had a plan. She denies having any firearms at home. Discharged to [facility] for subacute rehab.

## 2024-06-14 DIAGNOSIS — G894 Chronic pain syndrome: Secondary | ICD-10-CM | POA: Diagnosis not present

## 2024-06-14 DIAGNOSIS — M48061 Spinal stenosis, lumbar region without neurogenic claudication: Secondary | ICD-10-CM | POA: Diagnosis not present

## 2024-06-14 DIAGNOSIS — R2681 Unsteadiness on feet: Secondary | ICD-10-CM | POA: Diagnosis not present

## 2024-06-14 DIAGNOSIS — R41841 Cognitive communication deficit: Secondary | ICD-10-CM | POA: Diagnosis not present

## 2024-06-14 DIAGNOSIS — E440 Moderate protein-calorie malnutrition: Secondary | ICD-10-CM | POA: Diagnosis not present

## 2024-06-14 DIAGNOSIS — M6281 Muscle weakness (generalized): Secondary | ICD-10-CM | POA: Diagnosis not present

## 2024-06-14 DIAGNOSIS — J45909 Unspecified asthma, uncomplicated: Secondary | ICD-10-CM | POA: Diagnosis not present

## 2024-06-19 ENCOUNTER — Encounter (HOSPITAL_COMMUNITY): Payer: Self-pay | Admitting: Internal Medicine

## 2024-06-19 ENCOUNTER — Inpatient Hospital Stay (HOSPITAL_COMMUNITY)
Admission: EM | Admit: 2024-06-19 | Discharge: 2024-06-26 | DRG: 072 | Disposition: A | Discharge status: Skilled Nursing Facility | Source: Skilled Nursing Facility | Attending: Internal Medicine | Admitting: Internal Medicine

## 2024-06-19 ENCOUNTER — Emergency Department (HOSPITAL_COMMUNITY)

## 2024-06-19 ENCOUNTER — Other Ambulatory Visit: Payer: Self-pay

## 2024-06-19 DIAGNOSIS — R000 Tachycardia, unspecified: Secondary | ICD-10-CM | POA: Diagnosis not present

## 2024-06-19 DIAGNOSIS — D649 Anemia, unspecified: Principal | ICD-10-CM

## 2024-06-19 DIAGNOSIS — E785 Hyperlipidemia, unspecified: Secondary | ICD-10-CM | POA: Diagnosis present

## 2024-06-19 DIAGNOSIS — N390 Urinary tract infection, site not specified: Secondary | ICD-10-CM | POA: Diagnosis not present

## 2024-06-19 DIAGNOSIS — E782 Mixed hyperlipidemia: Secondary | ICD-10-CM

## 2024-06-19 DIAGNOSIS — E890 Postprocedural hypothyroidism: Secondary | ICD-10-CM | POA: Diagnosis present

## 2024-06-19 DIAGNOSIS — I10 Essential (primary) hypertension: Secondary | ICD-10-CM | POA: Diagnosis present

## 2024-06-19 DIAGNOSIS — R918 Other nonspecific abnormal finding of lung field: Secondary | ICD-10-CM | POA: Diagnosis not present

## 2024-06-19 DIAGNOSIS — E119 Type 2 diabetes mellitus without complications: Secondary | ICD-10-CM

## 2024-06-19 DIAGNOSIS — F411 Generalized anxiety disorder: Secondary | ICD-10-CM | POA: Diagnosis present

## 2024-06-19 DIAGNOSIS — E1169 Type 2 diabetes mellitus with other specified complication: Secondary | ICD-10-CM

## 2024-06-19 DIAGNOSIS — J4520 Mild intermittent asthma, uncomplicated: Secondary | ICD-10-CM | POA: Diagnosis not present

## 2024-06-19 DIAGNOSIS — M48061 Spinal stenosis, lumbar region without neurogenic claudication: Secondary | ICD-10-CM | POA: Diagnosis not present

## 2024-06-19 DIAGNOSIS — N3000 Acute cystitis without hematuria: Secondary | ICD-10-CM | POA: Diagnosis not present

## 2024-06-19 DIAGNOSIS — G9340 Encephalopathy, unspecified: Principal | ICD-10-CM | POA: Diagnosis present

## 2024-06-19 DIAGNOSIS — G9341 Metabolic encephalopathy: Secondary | ICD-10-CM | POA: Diagnosis present

## 2024-06-19 DIAGNOSIS — J45909 Unspecified asthma, uncomplicated: Secondary | ICD-10-CM | POA: Diagnosis present

## 2024-06-19 DIAGNOSIS — A419 Sepsis, unspecified organism: Secondary | ICD-10-CM

## 2024-06-19 DIAGNOSIS — N3090 Cystitis, unspecified without hematuria: Secondary | ICD-10-CM

## 2024-06-19 DIAGNOSIS — R4182 Altered mental status, unspecified: Secondary | ICD-10-CM | POA: Diagnosis not present

## 2024-06-19 DIAGNOSIS — Z794 Long term (current) use of insulin: Secondary | ICD-10-CM

## 2024-06-19 LAB — COMPREHENSIVE METABOLIC PANEL WITH GFR
ALT: 40 U/L (ref 0–44)
AST: 49 U/L — ABNORMAL HIGH (ref 15–41)
Albumin: 2.5 g/dL — ABNORMAL LOW (ref 3.5–5.0)
Alkaline Phosphatase: 74 U/L (ref 38–126)
Anion gap: 15 (ref 5–15)
BUN: 13 mg/dL (ref 8–23)
CO2: 22 mmol/L (ref 22–32)
Calcium: 8.3 mg/dL — ABNORMAL LOW (ref 8.9–10.3)
Chloride: 95 mmol/L — ABNORMAL LOW (ref 98–111)
Creatinine, Ser: 1.15 mg/dL — ABNORMAL HIGH (ref 0.44–1.00)
GFR, Estimated: 52 mL/min — ABNORMAL LOW (ref >60)
Glucose, Bld: 131 mg/dL — ABNORMAL HIGH (ref 70–99)
Potassium: 3.9 mmol/L (ref 3.5–5.1)
Sodium: 132 mmol/L — ABNORMAL LOW (ref 135–145)
Total Bilirubin: 1 mg/dL (ref 0.0–1.2)
Total Protein: 5.7 g/dL — ABNORMAL LOW (ref 6.5–8.1)

## 2024-06-19 LAB — CBC WITH DIFFERENTIAL/PLATELET
Abs Immature Granulocytes: 0.09 K/uL — ABNORMAL HIGH (ref 0.00–0.07)
Basophils Absolute: 0 K/uL (ref 0.0–0.1)
Basophils Relative: 1 %
Eosinophils Absolute: 0.1 K/uL (ref 0.0–0.5)
Eosinophils Relative: 2 %
HCT: 27.9 % — ABNORMAL LOW (ref 36.0–46.0)
Hemoglobin: 9.6 g/dL — ABNORMAL LOW (ref 12.0–15.0)
Immature Granulocytes: 2 %
Lymphocytes Relative: 28 %
Lymphs Abs: 1.7 K/uL (ref 0.7–4.0)
MCH: 30.7 pg (ref 26.0–34.0)
MCHC: 34.4 g/dL (ref 30.0–36.0)
MCV: 89.1 fL (ref 80.0–100.0)
Monocytes Absolute: 0.4 K/uL (ref 0.1–1.0)
Monocytes Relative: 6 %
Neutro Abs: 3.8 K/uL (ref 1.7–7.7)
Neutrophils Relative %: 61 %
Platelets: 234 K/uL (ref 150–400)
RBC: 3.13 MIL/uL — ABNORMAL LOW (ref 3.87–5.11)
RDW: 17.5 % — ABNORMAL HIGH (ref 11.5–15.5)
WBC: 6.1 K/uL (ref 4.0–10.5)
nRBC: 0 % (ref 0.0–0.2)

## 2024-06-19 LAB — URINALYSIS, W/ REFLEX TO CULTURE (INFECTION SUSPECTED)
Bilirubin Urine: NEGATIVE
Glucose, UA: NEGATIVE mg/dL
Ketones, ur: NEGATIVE mg/dL
Nitrite: NEGATIVE
Protein, ur: NEGATIVE mg/dL
Specific Gravity, Urine: 1.013 (ref 1.005–1.030)
pH: 6 (ref 5.0–8.0)

## 2024-06-19 LAB — I-STAT VENOUS BLOOD GAS, ED
Acid-Base Excess: 5 mmol/L — ABNORMAL HIGH (ref 0.0–2.0)
Bicarbonate: 27.3 mmol/L (ref 20.0–28.0)
Calcium, Ion: 1.07 mmol/L — ABNORMAL LOW (ref 1.15–1.40)
HCT: 27 % — ABNORMAL LOW (ref 36.0–46.0)
Hemoglobin: 9.2 g/dL — ABNORMAL LOW (ref 12.0–15.0)
O2 Saturation: 99 %
Potassium: 3.8 mmol/L (ref 3.5–5.1)
Sodium: 131 mmol/L — ABNORMAL LOW (ref 135–145)
TCO2: 28 mmol/L (ref 22–32)
pCO2, Ven: 31.7 mmHg — ABNORMAL LOW (ref 44–60)
pH, Ven: 7.544 — ABNORMAL HIGH (ref 7.25–7.43)
pO2, Ven: 133 mmHg — ABNORMAL HIGH (ref 32–45)

## 2024-06-19 LAB — FOLATE: Folate: 12.7 ng/mL (ref >5.9)

## 2024-06-19 LAB — IRON AND TIBC
Iron: 38 ug/dL (ref 28–170)
Saturation Ratios: 17 % (ref 10.4–31.8)
TIBC: 220 ug/dL — ABNORMAL LOW (ref 250–450)
UIBC: 182 ug/dL

## 2024-06-19 LAB — CBC
HCT: 27.8 % — ABNORMAL LOW (ref 36.0–46.0)
Hemoglobin: 9.3 g/dL — ABNORMAL LOW (ref 12.0–15.0)
MCH: 30.3 pg (ref 26.0–34.0)
MCHC: 33.5 g/dL (ref 30.0–36.0)
MCV: 90.6 fL (ref 80.0–100.0)
Platelets: 237 K/uL (ref 150–400)
RBC: 3.07 MIL/uL — ABNORMAL LOW (ref 3.87–5.11)
RDW: 17.7 % — ABNORMAL HIGH (ref 11.5–15.5)
WBC: 6.4 K/uL (ref 4.0–10.5)
nRBC: 0 % (ref 0.0–0.2)

## 2024-06-19 LAB — BRAIN NATRIURETIC PEPTIDE: B Natriuretic Peptide: 44.4 pg/mL (ref 0.0–100.0)

## 2024-06-19 LAB — RESP PANEL BY RT-PCR (RSV, FLU A&B, COVID)  RVPGX2
Influenza A by PCR: NEGATIVE
Influenza B by PCR: NEGATIVE
Resp Syncytial Virus by PCR: NEGATIVE
SARS Coronavirus 2 by RT PCR: NEGATIVE

## 2024-06-19 LAB — FERRITIN: Ferritin: 210 ng/mL (ref 11–307)

## 2024-06-19 LAB — POC OCCULT BLOOD, ED: Fecal Occult Bld: NEGATIVE

## 2024-06-19 LAB — TSH: TSH: 36.312 u[IU]/mL — ABNORMAL HIGH (ref 0.350–4.500)

## 2024-06-19 LAB — CBG MONITORING, ED: Glucose-Capillary: 144 mg/dL — ABNORMAL HIGH (ref 70–99)

## 2024-06-19 LAB — PROTIME-INR
INR: 1.1 (ref 0.8–1.2)
Prothrombin Time: 15.1 s (ref 11.4–15.2)

## 2024-06-19 LAB — I-STAT CG4 LACTIC ACID, ED: Lactic Acid, Venous: 1.4 mmol/L (ref 0.5–1.9)

## 2024-06-19 LAB — LIPASE, BLOOD: Lipase: 34 U/L (ref 11–51)

## 2024-06-19 MED ORDER — SODIUM CHLORIDE 0.9 % IV SOLN
2.0000 g | Freq: Once | INTRAVENOUS | Status: AC
  Administered 2024-06-19: 2 g via INTRAVENOUS
  Filled 2024-06-19: qty 20

## 2024-06-19 MED ORDER — ACETAMINOPHEN 500 MG PO TABS
1000.0000 mg | ORAL_TABLET | Freq: Once | ORAL | Status: AC
  Administered 2024-06-19: 1000 mg via ORAL
  Filled 2024-06-19: qty 2

## 2024-06-19 MED ORDER — SODIUM CHLORIDE 0.9 % IV SOLN
1.0000 g | INTRAVENOUS | Status: DC
  Administered 2024-06-20 – 2024-06-22 (×3): 1 g via INTRAVENOUS
  Filled 2024-06-19 (×4): qty 10

## 2024-06-19 MED ORDER — ALBUTEROL SULFATE (2.5 MG/3ML) 0.083% IN NEBU: 2.5000 mg | INHALATION_SOLUTION | Freq: Four times a day (QID) | RESPIRATORY_TRACT | Status: DC | PRN

## 2024-06-19 MED ORDER — ACETAMINOPHEN 650 MG RE SUPP
650.0000 mg | Freq: Four times a day (QID) | RECTAL | Status: DC | PRN
Start: 2024-06-19 — End: 2024-06-26

## 2024-06-19 MED ORDER — LACTATED RINGERS IV BOLUS
1000.0000 mL | Freq: Once | INTRAVENOUS | Status: AC
  Administered 2024-06-19: 1000 mL via INTRAVENOUS

## 2024-06-19 MED ORDER — POLYETHYLENE GLYCOL 3350 17 G PO PACK: 17.0000 g | PACK | Freq: Every day | ORAL | Status: DC | PRN

## 2024-06-19 MED ORDER — SODIUM CHLORIDE 0.9% FLUSH
3.0000 mL | Freq: Two times a day (BID) | INTRAVENOUS | Status: DC
  Administered 2024-06-19 – 2024-06-26 (×15): 3 mL via INTRAVENOUS

## 2024-06-19 MED ORDER — HYDROCODONE-ACETAMINOPHEN 5-325 MG PO TABS
1.0000 | ORAL_TABLET | Freq: Three times a day (TID) | ORAL | Status: DC | PRN
Start: 2024-06-19 — End: 2024-06-26
  Administered 2024-06-20: 1 via ORAL
  Filled 2024-06-19: qty 1

## 2024-06-19 MED ORDER — METRONIDAZOLE 500 MG/100ML IV SOLN: 500.0000 mg | Freq: Once | INTRAVENOUS | Status: DC

## 2024-06-19 MED ORDER — ENSURE PLUS HIGH PROTEIN PO LIQD
237.0000 mL | Freq: Two times a day (BID) | ORAL | Status: DC
  Administered 2024-06-19 – 2024-06-26 (×13): 237 mL via ORAL
  Filled 2024-06-19 (×3): qty 237

## 2024-06-19 MED ORDER — GABAPENTIN 300 MG PO CAPS
300.0000 mg | ORAL_CAPSULE | Freq: Two times a day (BID) | ORAL | Status: DC
  Administered 2024-06-19 – 2024-06-26 (×14): 300 mg via ORAL
  Filled 2024-06-19 (×14): qty 1

## 2024-06-19 MED ORDER — LEVOTHYROXINE SODIUM 112 MCG PO TABS
112.0000 ug | ORAL_TABLET | Freq: Every day | ORAL | Status: DC
  Administered 2024-06-20 – 2024-06-26 (×7): 112 ug via ORAL
  Filled 2024-06-19 (×7): qty 1

## 2024-06-19 MED ORDER — VANCOMYCIN HCL 2000 MG/400ML IV SOLN
2000.0000 mg | Freq: Once | INTRAVENOUS | Status: DC
  Filled 2024-06-19: qty 400

## 2024-06-19 MED ORDER — INSULIN ASPART 100 UNIT/ML IJ SOLN
0.0000 [IU] | Freq: Three times a day (TID) | INTRAMUSCULAR | Status: DC
  Administered 2024-06-19 – 2024-06-20 (×4): 2 [IU] via SUBCUTANEOUS
  Administered 2024-06-21: 3 [IU] via SUBCUTANEOUS
  Administered 2024-06-21 – 2024-06-22 (×2): 2 [IU] via SUBCUTANEOUS
  Administered 2024-06-22: 3 [IU] via SUBCUTANEOUS
  Administered 2024-06-22 – 2024-06-23 (×3): 2 [IU] via SUBCUTANEOUS
  Administered 2024-06-24 (×2): 3 [IU] via SUBCUTANEOUS
  Administered 2024-06-24 – 2024-06-25 (×2): 2 [IU] via SUBCUTANEOUS
  Administered 2024-06-25: 3 [IU] via SUBCUTANEOUS
  Administered 2024-06-25 – 2024-06-26 (×2): 2 [IU] via SUBCUTANEOUS
  Administered 2024-06-26: 3 [IU] via SUBCUTANEOUS
  Filled 2024-06-19: qty 3
  Filled 2024-06-19 (×2): qty 2
  Filled 2024-06-19: qty 3
  Filled 2024-06-19 (×3): qty 2
  Filled 2024-06-19: qty 3
  Filled 2024-06-19: qty 2
  Filled 2024-06-19: qty 3
  Filled 2024-06-19 (×5): qty 2
  Filled 2024-06-19: qty 3
  Filled 2024-06-19: qty 2

## 2024-06-19 MED ORDER — ALBUTEROL SULFATE HFA 108 (90 BASE) MCG/ACT IN AERS: 2.0000 | INHALATION_SPRAY | Freq: Four times a day (QID) | RESPIRATORY_TRACT | Status: DC | PRN

## 2024-06-19 MED ORDER — MIDODRINE HCL 5 MG PO TABS
5.0000 mg | ORAL_TABLET | Freq: Three times a day (TID) | ORAL | Status: DC
  Administered 2024-06-19 – 2024-06-26 (×21): 5 mg via ORAL
  Filled 2024-06-19 (×21): qty 1

## 2024-06-19 MED ORDER — ACETAMINOPHEN 325 MG PO TABS
650.0000 mg | ORAL_TABLET | Freq: Four times a day (QID) | ORAL | Status: DC | PRN
  Administered 2024-06-20 – 2024-06-23 (×3): 650 mg via ORAL
  Filled 2024-06-19 (×3): qty 2

## 2024-06-19 MED ORDER — ROSUVASTATIN CALCIUM 5 MG PO TABS
5.0000 mg | ORAL_TABLET | ORAL | Status: DC
  Administered 2024-06-21 – 2024-06-26 (×3): 5 mg via ORAL
  Filled 2024-06-19 (×3): qty 1

## 2024-06-19 NOTE — ED Triage Notes (Signed)
 Pt bib ptar from [facility] with reports of AMS and combativeness per nursing facility staff. Per PTAR, pt alert and oriented. VSS.

## 2024-06-19 NOTE — ED Notes (Signed)
 Second message sent to pharmacy regarding missing dose for Ensure/feeding supplement. Awaiting dose. Seena, MD notified of patient soft blood pressure.

## 2024-06-19 NOTE — H&P (Signed)
 History and Physical   Debra Watkins FMW:[id number] DOB: 1956/04/22 DOA: 06/19/2024  PCP: Geofm Glade PARAS, MD   Patient coming from: [name]  Chief Complaint: Altered mental status  HPI: Debra Watkins is a 68 y.o. female with medical history significant of hypertension, hyperlipidemia, diabetes, hypothyroidism, obesity, anxiety, reactive airway disease, spinal stenosis, chronic pain presenting for mashing placed with altered mental status.  Patient was reportedly altered at [facility] and combative per staff.  On EMS arrival patient found to be alert and oriented with stable vital signs.  Brought to the ED for further evaluation.  Patient denies fevers, chills, chest pain, shortness of breath, abdominal pain, constipation, diarrhea, nausea, vomiting.  Of note, patient recently admitted 11/8-11/12.  Admitted for hypothyroidism as she had been off Synthroid  prior to that but restarted that admission.  Also noted to have AKI and electrolyte disturbance with were addressed.  Low blood pressure noted blood pressure medications were held and midodrine started.  Patient noted to have significant deconditioning and was sent to SNF for rehab.  ED Course: Vital signs in the ED notable for fever to 101.4, heart rate in the 100s, blood pressure in the 110s-120s systolic.  Lab workup included CMP with sodium 132, chloride 95, creatinine stable 1.1, glucose 131, calcium  8.3, protein 5.7, albumin 2.5, AST stable at 49.  CBC with hemoglobin 9.6 down from 1310 days ago.  PT and INR normal.  Lactic acid normal.  Lipase normal.  FOBT negative.  Respiratory panel for flu COVID and RSV negative.  Urinalysis with hemoglobin, leukocytes, bacteria.  Urine culture and blood culture pending.  TSH 36 which is down from 50 last admission.  VBG with pH 7.54 and pCO2 31.7.  CT head showed no acute abnormality.  Chest x-ray showed subtle left lower lobe opacity consistent with atelectasis versus early  infection.    Review of Systems: As per HPI otherwise all other systems reviewed and are negative.  Past Medical History:  Diagnosis Date   Anxiety    Headache(784.0)    Hypercholesteremia    Hyperthyroidism    s/p I-131 ablation 10/2007   Hypothyroidism    post radiation   Lumbar back pain    Overweight(278.02)    Palpitations    related to hyperthyroidism   Pulmonary nodule    4mm LLL on CXR 09/19/08, not seen on CXR f/u x 5 since   Unspecified deficiency anemia    Vitamin D  deficiency     Past Surgical History:  Procedure Laterality Date   CHOLECYSTECTOMY, LAPAROSCOPIC  2010   therapy for hyperthyroidism  10/2007   tonsillectomy  1963    Social History  reports that she has never smoked. She has never used smokeless tobacco. She reports that she does not currently use alcohol. She reports that she does not use drugs.  Allergies  Allergen Reactions   Bee Venom Swelling   Tetanus Toxoid Hives   Penicillins Rash    Family History  Adopted: Yes  Problem Relation Age of Onset   Cancer - Colon Brother   Reviewed on admission  Prior to Admission medications   Medication Sig Start Date End Date Taking? Authorizing Provider  acetaminophen  (TYLENOL ) 500 MG tablet Take 1,000 mg by mouth 2 (two) times daily as needed for headache or fever (pain).    [provider]  albuterol  (VENTOLIN  HFA) 108 (90 Base) MCG/ACT inhaler Inhale 2 puffs into the lungs every 6 (six) hours as needed. 05/31/22   Burns,  Glade PARAS, MD  feeding supplement (ENSURE PLUS HIGH PROTEIN) LIQD Take 237 mLs by mouth 2 (two) times daily between meals. 06/12/24   Jolaine Pac, DO  furosemide  (LASIX ) 20 MG tablet TAKE 1 TABLET BY MOUTH DAILY. TAKE AN EXTRA PILL 1-2 TIMES PER WEEK IF NEEDED FOR SWELLING Patient not taking: Reported on 06/10/2024 11/13/23   Geofm Glade PARAS, MD  gabapentin  (NEURONTIN ) 300 MG capsule Take 1 capsule (300 mg total) by mouth 2 (two) times daily. 01/01/24   Geofm Glade PARAS, MD   HYDROcodone -acetaminophen  (NORCO/VICODIN) 5-325 MG tablet Take 1 tablet by mouth every 8 (eight) hours as needed. 06/12/24   Ghimire, Donalda HERO, MD  insulin aspart (NOVOLOG) 100 UNIT/ML injection Inject 0-9 Units into the skin 3 (three) times daily with meals. 06/12/24   Jolaine Pac, DO  levothyroxine  (SYNTHROID ) 112 MCG tablet TAKE 1 TABLET(112 MCG) BY MOUTH DAILY 03/11/24   Geofm Glade PARAS, MD  metoprolol  tartrate (LOPRESSOR ) 25 MG tablet TAKE 1/2 TABLET(12.5 MG) BY MOUTH TWICE DAILY Patient taking differently: Take 25 mg by mouth daily. 12/18/23   Geofm Glade PARAS, MD  midodrine (PROAMATINE) 5 MG tablet Take 1 tablet (5 mg total) by mouth 3 (three) times daily with meals. 06/12/24   Jolaine Pac, DO  Multiple Vitamins-Minerals (MULTIVITAMIN WOMEN 50+) TABS Take 1 tablet by mouth daily.    [provider]  Nystatin (GERHARDT'S BUTT CREAM) CREA Apply 1 Application topically 3 (three) times daily. 06/12/24   Jolaine Pac, DO  polyethylene glycol (MIRALAX / GLYCOLAX) 17 g packet Take 17 g by mouth daily as needed for mild constipation. 06/12/24   Jolaine Pac, DO  rosuvastatin  (CRESTOR ) 5 MG tablet TAKE 1 TABLET(5 MG) BY MOUTH 3 TIMES A WEEK 05/02/24   Burns, Glade PARAS, MD  Semaglutide  (RYBELSUS ) 3 MG TABS Take 1 tablet (3 mg total) by mouth daily. 01/01/24   Geofm Glade PARAS, MD    Physical Exam: Vitals:   06/19/24 1037 06/19/24 1215  BP: 122/69 114/89  Pulse: (!) 110 (!) 109  Resp: 20 17  Temp: (!) 101.4 F (38.6 C)   SpO2: 94% 94%    Physical Exam Constitutional:      General: She is not in acute distress.    Appearance: Normal appearance. She is obese.  HENT:     Head: Normocephalic and atraumatic.     Mouth/Throat:     Mouth: Mucous membranes are moist.     Pharynx: Oropharynx is clear.  Eyes:     Extraocular Movements: Extraocular movements intact.     Pupils: Pupils are equal, round, and reactive to light.  Cardiovascular:     Rate and Rhythm: Regular rhythm.  Tachycardia present.     Pulses: Normal pulses.     Heart sounds: Normal heart sounds.  Pulmonary:     Effort: Pulmonary effort is normal. No respiratory distress.     Breath sounds: Normal breath sounds.  Abdominal:     General: Bowel sounds are normal. There is no distension.     Palpations: Abdomen is soft.     Tenderness: There is no abdominal tenderness.  Musculoskeletal:        General: No swelling or deformity.  Skin:    General: Skin is warm and dry.  Neurological:     General: No focal deficit present.     Mental Status: Mental status is at baseline.     Comments: Alert and oriented to person and place.  Not year.  May be her  baseline.    Labs on Admission: I have personally reviewed following labs and imaging studies  CBC: Recent Labs  Lab 06/19/24 1040 06/19/24 1052  WBC 6.1  --   NEUTROABS 3.8  --   HGB 9.6* 9.2*  HCT 27.9* 27.0*  MCV 89.1  --   PLT 234  --     Basic Metabolic Panel: Recent Labs  Lab 06/19/24 1040 06/19/24 1052  NA 132* 131*  K 3.9 3.8  CL 95*  --   CO2 22  --   GLUCOSE 131*  --   BUN 13  --   CREATININE 1.15*  --   CALCIUM  8.3*  --     GFR: Estimated Creatinine Clearance: 50.7 mL/min (A) (by C-G formula based on SCr of 1.15 mg/dL (H)).  Liver Function Tests: Recent Labs  Lab 06/19/24 1040  AST 49*  ALT 40  ALKPHOS 74  BILITOT 1.0  PROT 5.7*  ALBUMIN 2.5*    Urine analysis:    Component Value Date/Time   COLORURINE AMBER (A) 06/19/2024 1040   APPEARANCEUR CLOUDY (A) 06/19/2024 1040   LABSPEC 1.013 06/19/2024 1040   PHURINE 6.0 06/19/2024 1040   GLUCOSEU NEGATIVE 06/19/2024 1040   GLUCOSEU NEGATIVE 06/29/2015 0903   HGBUR MODERATE (A) 06/19/2024 1040   BILIRUBINUR NEGATIVE 06/19/2024 1040   KETONESUR NEGATIVE 06/19/2024 1040   PROTEINUR NEGATIVE 06/19/2024 1040   UROBILINOGEN 0.2 06/29/2015 0903   NITRITE NEGATIVE 06/19/2024 1040   LEUKOCYTESUR LARGE (A) 06/19/2024 1040    Radiological Exams on  Admission: DG Chest Port 1 View Result Date: 06/19/2024 CLINICAL DATA:  Possible sepsis.  Altered mental status. EXAM: PORTABLE CHEST 1 VIEW COMPARISON:  06/08/2024 FINDINGS: Lungs are adequately inflated with subtle patchy density over the left base which may be due to atelectasis or early infection. No effusion. Cardiomediastinal silhouette and remainder of the exam is unchanged. IMPRESSION: Subtle patchy density over the left base which may be due to atelectasis or early infection. Electronically Signed   By: Toribio Agreste M.D.   On: 06/19/2024 11:56   CT Head Wo Contrast Result Date: 06/19/2024 EXAM: CT HEAD WITHOUT CONTRAST 06/19/2024 10:55:00 AM TECHNIQUE: CT of the head was performed without the administration of intravenous contrast. Automated exposure control, iterative reconstruction, and/or weight based adjustment of the mA/kV was utilized to reduce the radiation dose to as low as reasonably achievable. COMPARISON: Head CT 06/08/2024. CLINICAL HISTORY: 68 year old female with mental status change of unknown cause. FINDINGS: BRAIN AND VENTRICLES: No acute hemorrhage. No evidence of acute infarct. No hydrocephalus. No extra-axial collection. No mass effect or midline shift. Stable brain volume. Patchy and moderate for age periventricular white matter hypodensity is stable, most pronounced in the frontal horn regions. Calcified atherosclerosis at the skull base. No suspicious intracranial vascular hyperdensity. ORBITS: No acute abnormality. SINUSES: Paranasal sinuses, middle ears and mastoids remain well aerated. SOFT TISSUES AND SKULL: No acute soft tissue abnormality. No skull fracture. IMPRESSION: 1. No acute intracranial abnormality. 2. Stable Moderate for age white matter changes, most commonly due to small vessel disease. Electronically signed by: Helayne Hurst MD 06/19/2024 11:13 AM EST RP Workstation: [id number]   EKG: Independently reviewed.  Sinus tachycardia at 107 bpm.  Nonspecific T wave  changes.  Baseline wander throughout.  Assessment/Plan Principal Problem:   Acute encephalopathy Active Problems:   Hypothyroidism following radioiodine therapy   Anxiety state   Mild reactive airways disease   Essential hypertension, benign   Lumbar spinal stenosis with chronic back pain  Diabetes (HCC)   Hyperlipidemia   Acute encephalopathy UTI > At facility noted to be altered and combative.  So far here is alert and oriented to person, place, not year.  This may be her baseline, has had history of atypical behavior and responding slowly in the past but follows commands and answers questions appropriately. > Noted to have fever to 101.4 with urinalysis showing hemoglobin, leukocytes, bacteria.  Presumed metabolic encephalopathy secondary to UTI.  Did have associated tachycardia. > No leukocytosis currently.  Respiratory panel for flu COVID and RSV negative.  No other symptoms reported. > CT head negative in the ED. > Started on ceftriaxone in the ED - Monitor on telemetry - Continue ceftriaxone - Trend fever curve and WBC - Follow-up urine culture and blood culture  Anemia > Normocytic anemia with hemoglobin 9.6 in the ED.  Down from 13 ten days ago. > No reported bleeding or black or bloody stools.FOBT negative in the ED. > Repeat CBC ordered in the ED to confirm this is not a lab error. > No evidence of volume overload to point towards hemodilution, though limited by baseline body habitus, will check BNP for completeness.Lasix  has been held recently. > Will start anemia workup and hold off on chemoprophylaxis for DVT at this time. - Monitoring on telemetry as above - Check iron, ferritin, folate, B12 - Check BNP - Trend CBC  History of hypertension Hypotension > During recent admission had more issues with hypotension than hypertension.  Lasix  and metoprolol  were held and patient was started on midodrine. > Blood pressure in the 110s-120s systolic in the ED - Continue  with home midodrine  Hyperlipidemia - Continue home rosuvastatin   Diabetes - SSI  Hypothyroidism > Had missed Synthroid  dose until recently.  During last admission TSH 50, now 67. - Continue home Synthroid   Reactive airway disease - Continue as needed albuterol   Spinal stenosis Chronic pain - Continue home gabapentin  and Norco  DVT prophylaxis: SCDs Code Status:   Full Family Communication:  None on admission  Disposition Plan:   Patient is from:  Home  Anticipated DC to:  Home  Anticipated DC date:  1 to 2 days  Anticipated DC barriers: None  Consults called:  None Admission status:  Observation, telemetry  Severity of Illness: The appropriate patient status for this patient is OBSERVATION. Observation status is judged to be reasonable and necessary in order to provide the required intensity of service to ensure the patient's safety. The patient's presenting symptoms, physical exam findings, and initial radiographic and laboratory data in the context of their medical condition is felt to place them at decreased risk for further clinical deterioration. Furthermore, it is anticipated that the patient will be medically stable for discharge from the hospital within 2 midnights of admission.    Marsa KATHEE Scurry MD [facility]  How to contact the [facility] Attending or Consulting provider 7A - 7P or covering provider during after hours 7P -7A, for this patient?   Check the care team in [facility] and look for a) attending/consulting [facility] provider listed and b) the [facility] team listed Log into [email or web address] and use [facility] universal password to access. If you do not have the password, please contact the hospital operator. Locate the [facility] provider you are looking for under [facility] and page to a number that you can be directly reached. If you still have difficulty reaching the provider, please page the [facility] (Director on Call) for the Hospitalists listed on amion for  assistance.  06/19/2024, 2:03 PM

## 2024-06-19 NOTE — ED Provider Notes (Signed)
  EMERGENCY DEPARTMENT AT [facility] Provider Note   CSN: [id number] Arrival date & time: 06/19/24  1031     History Chief Complaint  Patient presents with   Altered Mental Status    HPI: RAYCHELLE HUDMAN is a 68 y.o. female with history pertinent for lumbar spinal stenosis, hypothyroidism, elevated BMI, reactive airway disease, HTN, T2DM, HLD, chronic pain syndrome who presents complaining of AMS/combativeness. Patient arrived via EMS from [facility].  History provided by patient and EMS.  No interpreter required during this encounter.  EMS reports that they were called out by facility because patient was reportedly altered and combative.  Reportedly has been using curse words and was verbally and physically aggressive with staff at facility.  EMS reports that they were told that the patient was altered, however note that patient was reportedly admitted 2 days ago for altered mental status, and that they were not explicitly told by facility how patient was altered from her baseline when she was admitted 2 days ago.  Patient denies headache, chest pain, fever, chills, shortness of breath, nausea, vomiting, diarrhea, abdominal pain, dysuria  Patient's recorded medical, surgical, social, medication list and allergies were reviewed in the Snapshot window as part of the initial history.   Prior to Admission medications   Medication Sig Start Date End Date Taking? Authorizing Provider  acetaminophen  (TYLENOL ) 500 MG tablet Take 1,000 mg by mouth 2 (two) times daily as needed for headache or fever (pain).   Yes [provider]  albuterol  (VENTOLIN  HFA) 108 (90 Base) MCG/ACT inhaler Inhale 2 puffs into the lungs every 6 (six) hours as needed. Patient taking differently: Inhale 2 puffs into the lungs every 6 (six) hours as needed for wheezing or shortness of breath. 05/31/22  Yes Burns, Glade PARAS, MD  feeding supplement (ENSURE PLUS HIGH PROTEIN) LIQD Take  237 mLs by mouth 2 (two) times daily between meals. 06/12/24  Yes Jolaine Pac, DO  gabapentin  (NEURONTIN ) 300 MG capsule Take 1 capsule (300 mg total) by mouth 2 (two) times daily. 01/01/24  Yes Burns, Glade PARAS, MD  HYDROcodone -acetaminophen  (NORCO/VICODIN) 5-325 MG tablet Take 1 tablet by mouth every 8 (eight) hours as needed. 06/12/24  Yes Ghimire, Donalda HERO, MD  insulin aspart (NOVOLOG) 100 UNIT/ML injection Inject 0-9 Units into the skin 3 (three) times daily with meals. Patient taking differently: Inject 0-9 Units into the skin 3 (three) times daily with meals. If blood sugar is less than 70, call md. 70-120 = 0 units; 121-200 = 2 units; 201-250 = 3 units; 251-300 =5 units; 301-400 = 9 units; if blood sugar is greater than 400 call MD 06/12/24  Yes Jolaine Pac, DO  levothyroxine  (SYNTHROID ) 112 MCG tablet TAKE 1 TABLET(112 MCG) BY MOUTH DAILY 03/11/24  Yes Burns, Glade PARAS, MD  midodrine (PROAMATINE) 5 MG tablet Take 1 tablet (5 mg total) by mouth 3 (three) times daily with meals. 06/12/24  Yes Jolaine Pac, DO  Multiple Vitamins-Minerals (MULTIVITAMIN WOMEN 50+) TABS Take 1 tablet by mouth daily.   Yes [provider]  polyethylene glycol (MIRALAX / GLYCOLAX) 17 g packet Take 17 g by mouth daily as needed for mild constipation. 06/12/24  Yes Jolaine Pac, DO  rosuvastatin  (CRESTOR ) 5 MG tablet TAKE 1 TABLET(5 MG) BY MOUTH 3 TIMES A WEEK 05/02/24  Yes Burns, Glade PARAS, MD  zinc oxide (BALMEX) 11.3 % CREA cream Apply 1 Application topically in the morning, at noon, and at bedtime.   Yes  [provider]  furosemide  (LASIX ) 20 MG tablet TAKE 1 TABLET BY MOUTH DAILY. TAKE AN EXTRA PILL 1-2 TIMES PER WEEK IF NEEDED FOR SWELLING Patient not taking: Reported on 06/10/2024 11/13/23   Geofm Glade PARAS, MD  metoprolol  tartrate (LOPRESSOR ) 25 MG tablet TAKE 1/2 TABLET(12.5 MG) BY MOUTH TWICE DAILY Patient taking differently: Take 25 mg by mouth daily. 12/18/23   Geofm Glade PARAS, MD   Nystatin (GERHARDT'S BUTT CREAM) CREA Apply 1 Application topically 3 (three) times daily. Patient not taking: Reported on 06/19/2024 06/12/24   Jolaine Pac, DO  Semaglutide  (RYBELSUS ) 3 MG TABS Take 1 tablet (3 mg total) by mouth daily. Patient not taking: Reported on 06/19/2024 01/01/24   Geofm Glade PARAS, MD     Allergies: Bee venom, Tetanus toxoid, and Penicillins   Review of Systems   ROS as per HPI  Physical Exam Updated Vital Signs BP 130/68 (BP Location: Right Arm)   Pulse 93   Temp 98.1 F (36.7 C) (Oral)   Resp 20   SpO2 96%  Physical Exam Vitals and nursing note reviewed.  Constitutional:      General: She is not in acute distress.    Appearance: She is well-developed.  HENT:     Head: Normocephalic and atraumatic.  Eyes:     Conjunctiva/sclera: Conjunctivae normal.  Cardiovascular:     Rate and Rhythm: Regular rhythm. Tachycardia present.     Heart sounds: No murmur heard. Pulmonary:     Effort: Pulmonary effort is normal. No respiratory distress.     Breath sounds: Normal breath sounds.  Abdominal:     Palpations: Abdomen is soft.     Tenderness: There is no abdominal tenderness.  Genitourinary:    Rectum: Normal. Guaiac result negative.     Comments: Brown stool Musculoskeletal:        General: No swelling.     Cervical back: Neck supple.  Skin:    General: Skin is warm and dry.     Capillary Refill: Capillary refill takes less than 2 seconds.  Neurological:     Mental Status: She is alert.  Psychiatric:        Mood and Affect: Mood normal.     ED Course/ Medical Decision Making/ A&P    Procedures Procedures   Medications Ordered in ED Medications  cefTRIAXone (ROCEPHIN) 1 g in sodium chloride 0.9 % 100 mL IVPB (has no administration in time range)  HYDROcodone -acetaminophen  (NORCO/VICODIN) 5-325 MG per tablet 1 tablet (has no administration in time range)  midodrine (PROAMATINE) tablet 5 mg (5 mg Oral Given 06/19/24 1746)  rosuvastatin   (CRESTOR ) tablet 5 mg (has no administration in time range)  levothyroxine  (SYNTHROID ) tablet 112 mcg (112 mcg Oral Given 06/20/24 0455)  gabapentin  (NEURONTIN ) capsule 300 mg (300 mg Oral Given 06/19/24 2111)  feeding supplement (ENSURE PLUS HIGH PROTEIN) liquid 237 mL (237 mLs Oral Given 06/19/24 1948)  sodium chloride flush (NS) 0.9 % injection 3 mL (3 mLs Intravenous Given 06/19/24 2112)  acetaminophen  (TYLENOL ) tablet 650 mg (has no administration in time range)    Or  acetaminophen  (TYLENOL ) suppository 650 mg (has no administration in time range)  polyethylene glycol (MIRALAX / GLYCOLAX) packet 17 g (has no administration in time range)  albuterol  (PROVENTIL ) (2.5 MG/3ML) 0.083% nebulizer solution 2.5 mg (has no administration in time range)  insulin aspart (novoLOG) injection 0-15 Units (2 Units Subcutaneous Given 06/19/24 1753)  acetaminophen  (TYLENOL ) tablet 1,000 mg (1,000 mg Oral Given 06/19/24 1104)  lactated  ringers  bolus 1,000 mL (0 mLs Intravenous Stopped 06/19/24 1222)  cefTRIAXone  (ROCEPHIN ) 2 g in sodium chloride  0.9 % 100 mL IVPB (0 g Intravenous Stopped 06/19/24 1454)    Medical Decision Making:   CARIAH SALATINO is a 68 y.o. female who presents for reported altered mental status/combativeness as per above.  Physical exam is pertinent for febrile on vitals, otherwise no focal abnormalities.   The differential includes but is not limited to sepsis, volume overload, thyroid  abnormality, UTI, metabolic encephalopathy.  Independent historian: EMS  External data reviewed: Labs: reviewed prior labs for baseline and Notes: Reviewed patient's recent discharge summary, patient was admitted for severe hypothyroidism in the setting of nonadherence to home medications and was found to have multiple electrolyte derangements and AKI.  Initial Plan:  Screening labs including CBC and Metabolic panel to evaluate for infectious or metabolic etiology of disease.  Screening TSH given  patient recently admitted for symptomatic hypothyroidism RVP given patient febrile Lipase/coags/lactic acid/blood cultures/VBG in the setting of sepsis Urinalysis with reflex culture ordered to evaluate for UTI or relevant urologic/nephrologic pathology.  CT head to evaluate for structural/infectious intra-cranial pathology in the setting of altered mental status Chest x-ray to evaluate for structural/infectious intra-thoracic pathology in the setting of sepsis EKG to evaluate for cardiac pathology Objective evaluation as below reviewed   Labs: Ordered, Independent interpretation, and Details: Lactic acid WNL.  CBC without leukocytosis, thrombocytopenia. New anemia to 9.6 from baseline of 12-14 previously.  VBG without hypercarbia or acidosis.  BNP WNL.  TSH significantly elevated at 36, however improved from 50 previously.  CMP without AKI, emergent electrolyte derangement, emergent LFT abnormality coags reassuring.  UA concerning for UTI with present LE, WBCs, RBCs, bacteria  Radiology: Ordered, Independent interpretation, Details: CT head without ICH, displaced fracture, MLS, loss/white matter differentiation.  Chest x-ray without focal airspace opacification, cardiomediastinal silhouette derangement, pneumothorax, pleural effusion, bony derangement, and All images reviewed independently.  Agree with radiology report at this time.   DG Chest Port 1 View Result Date: 06/19/2024 CLINICAL DATA:  Possible sepsis.  Altered mental status. EXAM: PORTABLE CHEST 1 VIEW COMPARISON:  06/08/2024 FINDINGS: Lungs are adequately inflated with subtle patchy density over the left base which may be due to atelectasis or early infection. No effusion. Cardiomediastinal silhouette and remainder of the exam is unchanged. IMPRESSION: Subtle patchy density over the left base which may be due to atelectasis or early infection. Electronically Signed   By: Toribio Agreste M.D.   On: 06/19/2024 11:56   CT Head Wo  Contrast Result Date: 06/19/2024 EXAM: CT HEAD WITHOUT CONTRAST 06/19/2024 10:55:00 AM TECHNIQUE: CT of the head was performed without the administration of intravenous contrast. Automated exposure control, iterative reconstruction, and/or weight based adjustment of the mA/kV was utilized to reduce the radiation dose to as low as reasonably achievable. COMPARISON: Head CT 06/08/2024. CLINICAL HISTORY: 68 year old female with mental status change of unknown cause. FINDINGS: BRAIN AND VENTRICLES: No acute hemorrhage. No evidence of acute infarct. No hydrocephalus. No extra-axial collection. No mass effect or midline shift. Stable brain volume. Patchy and moderate for age periventricular white matter hypodensity is stable, most pronounced in the frontal horn regions. Calcified atherosclerosis at the skull base. No suspicious intracranial vascular hyperdensity. ORBITS: No acute abnormality. SINUSES: Paranasal sinuses, middle ears and mastoids remain well aerated. SOFT TISSUES AND SKULL: No acute soft tissue abnormality. No skull fracture. IMPRESSION: 1. No acute intracranial abnormality. 2. Stable Moderate for age white matter changes, most commonly due to  small vessel disease. Electronically signed by: Helayne Hurst MD 06/19/2024 11:13 AM EST RP Workstation: [id number]   CT Head Wo Contrast Result Date: 06/08/2024 EXAM: CT HEAD WITHOUT CONTRAST 06/08/2024 10:25:47 AM TECHNIQUE: CT of the head was performed without the administration of intravenous contrast. Automated exposure control, iterative reconstruction, and/or weight based adjustment of the mA/kV was utilized to reduce the radiation dose to as low as reasonably achievable. COMPARISON: None available. CLINICAL HISTORY: 68 year old female with shortness of breath, agitation, and altered mental status. FINDINGS: BRAIN AND VENTRICLES: No acute hemorrhage. No evidence of acute infarct. No hydrocephalus. No extra-axial collection. No mass effect or midline shift.  Brain volume within normal limits for age. Moderate for age patchy periventricular white matter hypodensity, nonspecific but most commonly due to small vessel disease. Calcified atherosclerosis at the skull base. No suspicious intracranial vascular hyperdensity. ORBITS: No acute abnormality. SINUSES: Paranasal sinuses, middle ears and mastoids are clear. SOFT TISSUES AND SKULL: No acute soft tissue abnormality. No skull fracture. Mild hyperostosis of the calvarium, normal variant. IMPRESSION: 1. No acute intracranial abnormality. 2. Moderate for age white matter changes, most commonly due to small vessel disease. Electronically signed by: Helayne Hurst MD 06/08/2024 10:31 AM EST RP Workstation: [id number]   DG Chest Port 1 View Result Date: 06/08/2024 EXAM: 1 VIEW XRAY OF THE CHEST 06/08/2024 10:10:00 AM COMPARISON: None available. CLINICAL HISTORY: 06/25/13 FINDINGS: LUNGS AND PLEURA: No focal pulmonary opacity. No pulmonary edema. No pleural effusion. No pneumothorax. HEART AND MEDIASTINUM: No acute abnormality of the cardiac and mediastinal silhouettes. BONES AND SOFT TISSUES: No acute fracture or destructive lesion. Multilevel thoracic osteophytosis. IMPRESSION: 1. No acute cardiopulmonary process. Electronically signed by: Waddell Calk MD 06/08/2024 10:28 AM EST RP Workstation: [id number]    EKG/Medicine tests: Ordered and Independent interpretation EKG Interpretation: Sinus tachycardia Low voltage, precordial leads Baseline wander in lead(s) III V1 V3 Confirmed by Rogelia Satterfield ([id number]) on 06/19/2024 12:17:36 PM                Interventions: Tylenol , LR bolus, ceftriaxone   See the EMR for full details regarding lab and imaging results.  Patient presents to the emergency department for report of altered mental status not otherwise specified at facility as well as combativeness.  On arrival patient has no complaints, however is febrile and has tachycardia, thus presentation is concerning for  sepsis.  Broad labs and imaging are indicated, however given patient is normotensive, does not have localizing symptoms, will hold empiric antibiotics at this time.  Labs and imaging reveal no significant anemia, as well as UTI on UA.  Patient was administered ceftriaxone , however despite fluids and Tylenol , patient was persistently tachycardic.  Additionally, patient has new anemia, no recent trauma, no evidence of blood loss on exam, stool is grossly and guaiac negative for blood, therefore unclear etiology of blood loss, therefore do feel that patient warrants workup and admission for anemia given patient previously had normal hemoglobin during recent hospitalization.  Medicine consulted for admission, discussed with Dr. Agapito who accepted the patient to his service, no additional acute events while patient was under my care.  Presentation is most consistent with acute complicated illness  Discussion of management or test interpretations with external provider(s): Dr. Melvin, hospitalists  Risk Drugs:OTC drugs and Prescription drug management Treatment: Decision regarding hospitalization  Disposition: ADMIT: I believe the patient requires admission for further care and management. The patient was admitted to hospitalists. Please see inpatient provider note for additional treatment plan details.   MDM generated  using voice dictation software and may contain dictation errors.  Please contact me for any clarification or with any questions.  Clinical Impression:  1. Anemia, unspecified type   2. Sepsis without acute organ dysfunction, due to unspecified organism (HCC)   3. Cystitis      Admit   Final Clinical Impression(s) / ED Diagnoses Final diagnoses:  Anemia, unspecified type  Sepsis without acute organ dysfunction, due to unspecified organism [facility])  Cystitis    Rx / DC Orders ED Discharge Orders     None        Rogelia Jerilynn RAMAN, MD 06/20/24 [phone]

## 2024-06-20 ENCOUNTER — Observation Stay (HOSPITAL_COMMUNITY)

## 2024-06-20 ENCOUNTER — Encounter: Payer: Self-pay | Admitting: Internal Medicine

## 2024-06-20 DIAGNOSIS — G9340 Encephalopathy, unspecified: Secondary | ICD-10-CM | POA: Diagnosis not present

## 2024-06-20 DIAGNOSIS — M1711 Unilateral primary osteoarthritis, right knee: Secondary | ICD-10-CM | POA: Diagnosis not present

## 2024-06-20 DIAGNOSIS — Z7401 Bed confinement status: Secondary | ICD-10-CM | POA: Diagnosis not present

## 2024-06-20 DIAGNOSIS — M85861 Other specified disorders of bone density and structure, right lower leg: Secondary | ICD-10-CM | POA: Diagnosis not present

## 2024-06-20 DIAGNOSIS — D649 Anemia, unspecified: Secondary | ICD-10-CM | POA: Diagnosis present

## 2024-06-20 DIAGNOSIS — M25561 Pain in right knee: Secondary | ICD-10-CM | POA: Diagnosis not present

## 2024-06-20 DIAGNOSIS — R531 Weakness: Secondary | ICD-10-CM | POA: Diagnosis not present

## 2024-06-20 DIAGNOSIS — G9341 Metabolic encephalopathy: Secondary | ICD-10-CM | POA: Diagnosis present

## 2024-06-20 DIAGNOSIS — M25461 Effusion, right knee: Secondary | ICD-10-CM | POA: Diagnosis not present

## 2024-06-20 LAB — GLUCOSE, CAPILLARY
Glucose-Capillary: 141 mg/dL — ABNORMAL HIGH (ref 70–99)
Glucose-Capillary: 136 mg/dL — ABNORMAL HIGH (ref 70–99)

## 2024-06-20 LAB — BLOOD CULTURE ID PANEL (REFLEXED) - BCID2

## 2024-06-20 LAB — COMPREHENSIVE METABOLIC PANEL WITH GFR
ALT: 43 U/L (ref 0–44)
AST: 50 U/L — ABNORMAL HIGH (ref 15–41)
Albumin: 2.7 g/dL — ABNORMAL LOW (ref 3.5–5.0)
Alkaline Phosphatase: 80 U/L (ref 38–126)
Anion gap: 13 (ref 5–15)
BUN: 16 mg/dL (ref 8–23)
CO2: 25 mmol/L (ref 22–32)
Calcium: 8.4 mg/dL — ABNORMAL LOW (ref 8.9–10.3)
Chloride: 95 mmol/L — ABNORMAL LOW (ref 98–111)
Creatinine, Ser: 1.07 mg/dL — ABNORMAL HIGH (ref 0.44–1.00)
GFR, Estimated: 57 mL/min — ABNORMAL LOW (ref >60)
Glucose, Bld: 154 mg/dL — ABNORMAL HIGH (ref 70–99)
Potassium: 3.6 mmol/L (ref 3.5–5.1)
Sodium: 133 mmol/L — ABNORMAL LOW (ref 135–145)
Total Bilirubin: 0.6 mg/dL (ref 0.0–1.2)
Total Protein: 6.2 g/dL — ABNORMAL LOW (ref 6.5–8.1)

## 2024-06-20 LAB — CBG MONITORING, ED
Glucose-Capillary: 131 mg/dL — ABNORMAL HIGH (ref 70–99)
Glucose-Capillary: 132 mg/dL — ABNORMAL HIGH (ref 70–99)
Glucose-Capillary: 162 mg/dL — ABNORMAL HIGH (ref 70–99)

## 2024-06-20 LAB — CBC
HCT: 29.2 % — ABNORMAL LOW (ref 36.0–46.0)
Hemoglobin: 9.6 g/dL — ABNORMAL LOW (ref 12.0–15.0)
MCH: 29.8 pg (ref 26.0–34.0)
MCHC: 32.9 g/dL (ref 30.0–36.0)
MCV: 90.7 fL (ref 80.0–100.0)
Platelets: 236 K/uL (ref 150–400)
RBC: 3.22 MIL/uL — ABNORMAL LOW (ref 3.87–5.11)
RDW: 17.5 % — ABNORMAL HIGH (ref 11.5–15.5)
WBC: 5.6 K/uL (ref 4.0–10.5)
nRBC: 0 % (ref 0.0–0.2)

## 2024-06-20 LAB — MRSA NEXT GEN BY PCR, NASAL: MRSA by PCR Next Gen: NOT DETECTED

## 2024-06-20 LAB — VITAMIN B12: Vitamin B-12: 324 pg/mL (ref 180–914)

## 2024-06-20 NOTE — ED Notes (Signed)
 PATIENT FROM [facility]

## 2024-06-20 NOTE — Progress Notes (Addendum)
 PHARMACY - PHYSICIAN COMMUNICATION CRITICAL VALUE ALERT - BLOOD CULTURE IDENTIFICATION (BCID)  Debra Watkins is an 68 y.o. female who presented to [facility] on 06/19/2024 with a chief complaint of AMS  1 of 3 positive for  MRSE  Name of physician (or Provider) Contacted: Dr. Dino  Current antibiotics: Ceftriaxone  Changes to prescribed antibiotics recommended:  Patient is on recommended antibiotics - No changes needed - likely a contaminant  Results for orders placed or performed during the hospital encounter of 06/19/24  Blood Culture ID Panel (Reflexed) (Collected: 06/19/2024 10:40 AM)  Result Value Ref Range   Enterococcus faecalis NOT DETECTED NOT DETECTED   Enterococcus Faecium NOT DETECTED NOT DETECTED   Listeria monocytogenes NOT DETECTED NOT DETECTED   Staphylococcus species DETECTED (A) NOT DETECTED   Staphylococcus aureus (BCID) NOT DETECTED NOT DETECTED   Staphylococcus epidermidis DETECTED (A) NOT DETECTED   Staphylococcus lugdunensis NOT DETECTED NOT DETECTED   Streptococcus species NOT DETECTED NOT DETECTED   Streptococcus agalactiae NOT DETECTED NOT DETECTED   Streptococcus pneumoniae NOT DETECTED NOT DETECTED   Streptococcus pyogenes NOT DETECTED NOT DETECTED   A.calcoaceticus-baumannii NOT DETECTED NOT DETECTED   Bacteroides fragilis NOT DETECTED NOT DETECTED   Enterobacterales NOT DETECTED NOT DETECTED   Enterobacter cloacae complex NOT DETECTED NOT DETECTED   Escherichia coli NOT DETECTED NOT DETECTED   Klebsiella aerogenes NOT DETECTED NOT DETECTED   Klebsiella oxytoca NOT DETECTED NOT DETECTED   Klebsiella pneumoniae NOT DETECTED NOT DETECTED   Proteus species NOT DETECTED NOT DETECTED   Salmonella species NOT DETECTED NOT DETECTED   Serratia marcescens NOT DETECTED NOT DETECTED   Haemophilus influenzae NOT DETECTED NOT DETECTED   Neisseria meningitidis NOT DETECTED NOT DETECTED   Pseudomonas aeruginosa NOT DETECTED NOT DETECTED    Stenotrophomonas maltophilia NOT DETECTED NOT DETECTED   Candida albicans NOT DETECTED NOT DETECTED   Candida auris NOT DETECTED NOT DETECTED   Candida glabrata NOT DETECTED NOT DETECTED   Candida krusei NOT DETECTED NOT DETECTED   Candida parapsilosis NOT DETECTED NOT DETECTED   Candida tropicalis NOT DETECTED NOT DETECTED   Cryptococcus neoformans/gattii NOT DETECTED NOT DETECTED   Methicillin resistance mecA/C DETECTED (A) NOT DETECTED    Debra Watkins 06/20/2024  4:55 PM

## 2024-06-20 NOTE — ED Notes (Addendum)
Patient changed into new brief

## 2024-06-20 NOTE — Care Management Obs Status (Signed)
 MEDICARE OBSERVATION STATUS NOTIFICATION   Patient Details  Name: Debra Watkins MRN: [id number] Date of Birth: [birth year]   Medicare Observation Status Notification Given:  Yes    Corean JAYSON Canary, RN 06/20/2024, 8:47 AM

## 2024-06-20 NOTE — Progress Notes (Signed)
 Pharmacy Quality Measure Review  This patient is appearing on a report for being at risk of failing the adherence measure for cholesterol (statin) medications this calendar year.   Medication: Rosuvastatin  5mg  Last fill date: 11/19 for 30 day supply  Insurance report was not up to date. No action needed at this time.   Angela Baalmann, PharmD [facility] Pharmacist

## 2024-06-20 NOTE — Progress Notes (Signed)
  Progress Note   Patient: Debra Watkins FMW:[id number] DOB: 28-Jul-1956 DOA: 06/19/2024     0 DOS: the patient was seen and examined on 06/20/2024   Brief hospital course:  68 y.o. female with medical history significant of hypertension, hyperlipidemia, diabetes, hypothyroidism, obesity, anxiety, reactive airway disease, spinal stenosis, chronic pain presented from [facility] with altered mental status.  She is currently being treated for acute metabolic encephalopathy in setting of possible acute uncomplicated urinary tract infection with intravenous antibiotics.  Assessment and Plan:  Acute metabolic encephalopathy Possible acute uncomplicated UTI -CT head negative in the ED. -Started on ceftriaxone  in the ED - Monitor on telemetry - Continue with intravenous ceftriaxone  - Trend fever curve and WBC - Follow-up urine culture and blood culture   Normocytic anemia, POA: -No reported bleeding or black or bloody stools.FOBT negative in the ED. -Follow-up CBC in a.m. - Monitoring on telemetry as above - Iron profile is unremarkable and folate is within normal limits    History of hypertension Hypotension > During recent admission had more issues with hypotension than hypertension.  Lasix  and metoprolol  were held and patient was started on midodrine . -Blood pressure in the 110s-120s systolic in the ED - Continue with home midodrine    Hyperlipidemia - Continue home rosuvastatin    Diabetes melitis type II - SSI   Hypothyroidism - Continue home Synthroid    Reactive airway disease - Continue as needed albuterol    Spinal stenosis Chronic pain - Continue home gabapentin  and Norco  Disposition: Back to [facility] once medically stable.     Subjective: She was complaining of severe right knee pain so I have ordered a right knee x-ray.  She has been pleasantly confused.  Physical Exam: Vitals:   06/20/24 0452 06/20/24 0756 06/20/24 0759 06/20/24 0808  BP: 130/68  124/62    Pulse: 93 97    Resp: 20 17    Temp: 98.1 F (36.7 C)  98.2 F (36.8 C)   TempSrc: Oral  Oral   SpO2: 96% 96%    Weight:    90.7 kg  Height:    5' 3 (1.6 m)   Constitutional: Appears pleasantly confused, not agitated Eyes: PERRL, lids and conjunctivae normal ENMT: Mucous membranes are moist. Posterior pharynx clear of any exudate or lesions.Normal dentition.  Neck: normal, supple, no masses, no thyromegaly Respiratory: clear to auscultation bilaterally, no wheezing, no crackles. Normal respiratory effort. No accessory muscle use.  Cardiovascular: Regular rate and rhythm, no murmurs / rubs / gallops. No extremity edema. 2+ pedal pulses. No carotid bruits.  Abdomen: no tenderness, no masses palpated. No hepatosplenomegaly. Bowel sounds positive.  Musculoskeletal: no clubbing / cyanosis. No joint deformity upper and lower extremities. Good ROM, no contractures. Normal muscle tone.  Skin: no rashes, lesions, ulcers. No induration Neurologic: No obvious focal neurological deficits  Data Reviewed:  There are no new results to review at this time.  Family Communication: None at the bedside  Disposition: Status is: Observation The patient remains OBS appropriate and will d/c before 2 midnights.  Planned Discharge Destination: Skilled nursing facility    Time spent: 40 minutes  Author: Deliliah Room, MD 06/20/2024 9:44 AM  For on call review [email or web address].

## 2024-06-21 ENCOUNTER — Other Ambulatory Visit: Payer: Self-pay | Admitting: Internal Medicine

## 2024-06-21 DIAGNOSIS — G9340 Encephalopathy, unspecified: Secondary | ICD-10-CM | POA: Diagnosis not present

## 2024-06-21 LAB — CULTURE, BLOOD (ROUTINE X 2): Special Requests: ADEQUATE

## 2024-06-21 LAB — URINE CULTURE

## 2024-06-21 LAB — GLUCOSE, CAPILLARY
Glucose-Capillary: 126 mg/dL — ABNORMAL HIGH (ref 70–99)
Glucose-Capillary: 155 mg/dL — ABNORMAL HIGH (ref 70–99)
Glucose-Capillary: 117 mg/dL — ABNORMAL HIGH (ref 70–99)
Glucose-Capillary: 131 mg/dL — ABNORMAL HIGH (ref 70–99)

## 2024-06-21 NOTE — TOC CM/SW Note (Signed)
 Transition of Care [facility]) - Inpatient Brief Assessment   Patient Details  Name: Debra Watkins MRN: [id number] Date of Birth: 1955-11-06  [facility] CM/SW Contact:    Almarie CHRISTELLA Goodie, LCSW Phone Number: 06/21/2024, 10:37 AM   Clinical Narrative:    Patient has been at [facility] for rehab since recent hospital stay, but [facility] will not accept back. Prior to brief SNF stay, patient was at home alone. CSW messaged MD, requested PT and OT evaluations to determine patient's needs. CSW to follow.    Transition of Care Asessment: Insurance and Status: Insurance coverage has been reviewed Patient has primary care physician: Yes Home environment has been reviewed: Home alone Prior level of function:: Recently at SNF for rehab Prior/Current Home Services: No current home services Social Drivers of Health Review: SDOH reviewed no interventions necessary Readmission risk has been reviewed: Yes Transition of care needs: transition of care needs identified, TOC will continue to follow

## 2024-06-21 NOTE — Evaluation (Signed)
 Occupational Therapy Evaluation Patient Details Name: Debra Watkins MRN: [id number] DOB: [birth year] Today's Date: 06/21/2024   History of Present Illness   Debra Watkins is a 68 yo female who presented from [facility] with AMS. Pt is being treated for acute metabolic encephalopathy in setting of possible UTI. PMH includes: recent admit 11/8 for AKI; HTN, HLD, DM II, hypothyroidism, anxiety, obesity (BMI 35), reactive airway disease, lumbar stenosis, and chronic pain.     Clinical Impressions [name] was evaluated s/p the above admission list. She presents from SNF and reports she was not getting OOB and doing bed level ADLs with staff assist. Upon evaluation the pt was limited by impaired cognition, general debility, L knee pain, poor sitting balance and limited activity tolerance. Overall she required max-total A for bed mobility with significant cues. Due to the deficits listed below the pt also needs total A for LB ADLs and min A for UB ADLs. Pt will benefit from continued acute OT services and skilled inpatient follow up therapy, <3 hours/day.      If plan is discharge home, recommend the following:   Two people to help with walking and/or transfers;A lot of help with bathing/dressing/bathroom;Assistance with cooking/housework;Direct supervision/assist for medications management;Direct supervision/assist for financial management;Assist for transportation;Help with stairs or ramp for entrance;Supervision due to cognitive status     Functional Status Assessment   Patient has had a recent decline in their functional status and demonstrates the ability to make significant improvements in function in a reasonable and predictable amount of time.     Equipment Recommendations   Other (comment)      Precautions/Restrictions   Precautions Precautions: Fall Recall of Precautions/Restrictions: Intact Restrictions Weight Bearing Restrictions Per Provider Order:  No     Mobility Bed Mobility Overal bed mobility: Needs Assistance Bed Mobility: Supine to Sit, Sit to Supine     Supine to sit: Max assist Sit to supine: Total assist   General bed mobility comments: required cues for initiation    Transfers          Balance Overall balance assessment: Needs assistance Sitting-balance support: No upper extremity supported, Feet supported Sitting balance-Leahy Scale: Poor           ADL either performed or assessed with clinical judgement   ADL Overall ADL's : Needs assistance/impaired Eating/Feeding: Set up   Grooming: Set up;Bed level   Upper Body Bathing: Minimal assistance   Lower Body Bathing: Total assistance   Upper Body Dressing : Minimal assistance   Lower Body Dressing: Total assistance   Toilet Transfer: Total assistance   Toileting- Clothing Manipulation and Hygiene: Total assistance       Functional mobility during ADLs: Total assistance General ADL Comments: pt requires constant cues for initation and sequencing. ADLs at bed level due to poor sitting balance     Vision Baseline Vision/History: 1 Wears glasses Vision Assessment?: No apparent visual deficits     Perception Perception: Not tested       Praxis Praxis: Not tested       Pertinent Vitals/Pain Pain Assessment Pain Assessment: Faces Faces Pain Scale: Hurts little more Pain Location: L knee Pain Descriptors / Indicators: Discomfort Pain Intervention(s): Limited activity within patient's tolerance, Monitored during session     Extremity/Trunk Assessment Upper Extremity Assessment Upper Extremity Assessment: Generalized weakness   Lower Extremity Assessment Lower Extremity Assessment: Defer to PT evaluation   Cervical / Trunk Assessment Cervical / Trunk Assessment: Other exceptions Cervical / Trunk Exceptions:  large body habitus   Communication Communication Communication: No apparent difficulties   Cognition Arousal:  Alert Behavior During Therapy: Flat affect, Anxious Cognition: Cognition impaired   Orientation impairments: Situation Awareness: Online awareness impaired Memory impairment (select all impairments): Short-term memory, Working memory Attention impairment (select first level of impairment): Sustained attention Executive functioning impairment (select all impairments): Initiation, Organization, Reasoning, Sequencing, Problem solving OT - Cognition Comments: pt with confusion throughout, needed cues for initiation, sequencing and problem solving.       Following commands: Impaired       Cueing  General Comments   Cueing Techniques: Verbal cues  VSS           Home Living Family/patient expects to be discharged to:: Skilled nursing facility         Additional Comments: recent d/c to SNF, prior to last admission she was from home      Prior Functioning/Environment Prior Level of Function : Needs assist             Mobility Comments: pt unable to provide detail, states she has not walked in over a year and did not get OOB at SNF ADLs Comments: reports bed level ADLs at SNF with staff assist    OT Problem List: Decreased range of motion;Decreased strength;Decreased activity tolerance;Impaired balance (sitting and/or standing);Decreased coordination;Decreased cognition;Decreased safety awareness;Decreased knowledge of use of DME or AE;Decreased knowledge of precautions;Cardiopulmonary status limiting activity;Obesity;Pain   OT Treatment/Interventions: Self-care/ADL training;Therapeutic exercise;Energy conservation;DME and/or AE instruction;Manual therapy;Therapeutic activities;Patient/family education;Balance training;Cognitive remediation/compensation      OT Goals(Current goals can be found in the care plan section)   Acute Rehab OT Goals Patient Stated Goal: to get up OT Goal Formulation: With patient Time For Goal Achievement: 07/05/24 Potential to Achieve  Goals: Fair ADL Goals Additional ADL Goal #1: Pt will complete bed mobility with min A Additional ADL Goal #2: Pt will tolerate ADLs on the EOB with superivison A for balance   OT Frequency:  Min 2X/week       AM-PAC OT 6 Clicks Daily Activity     Outcome Measure Help from another person eating meals?: A Little Help from another person taking care of personal grooming?: A Little Help from another person toileting, which includes using toliet, bedpan, or urinal?: Total Help from another person bathing (including washing, rinsing, drying)?: A Lot Help from another person to put on and taking off regular upper body clothing?: A Little Help from another person to put on and taking off regular lower body clothing?: Total 6 Click Score: 13   End of Session Nurse Communication: Mobility status  Activity Tolerance: Patient tolerated treatment well Patient left: in bed;with call bell/phone within reach;with bed alarm set  OT Visit Diagnosis: Unsteadiness on feet (R26.81);Other abnormalities of gait and mobility (R26.89);Muscle weakness (generalized) (M62.81);Other symptoms and signs involving cognitive function;Pain                Time: 1501-1520 OT Time Calculation (min): 19 min Charges:  OT General Charges $OT Visit: 1 Visit OT Evaluation $OT Eval Moderate Complexity: 1 Mod  Lucie Kendall, OTR/L Acute Rehabilitation Services Office [phone] Secure Chat Communication Preferred   Lucie JONETTA Kendall 06/21/2024, 3:58 PM

## 2024-06-21 NOTE — Progress Notes (Signed)
  Progress Note   Patient: Debra Watkins FMW:[id number] DOB: 1955/12/01 DOA: 06/19/2024     1 DOS: the patient was seen and examined on 06/21/2024   Brief hospital course:  68 y.o. female with medical history significant of hypertension, hyperlipidemia, diabetes, hypothyroidism, obesity, anxiety, reactive airway disease, spinal stenosis, chronic pain presented from [facility] with altered mental status.  She is currently being treated for acute metabolic encephalopathy in setting of possible acute uncomplicated urinary tract infection with intravenous antibiotics.  Assessment and Plan:  Acute metabolic encephalopathy, resolving, appears to be at baseline now Possible acute uncomplicated UTI -CT head negative in the ED. -Started on ceftriaxone  in the ED - Monitor on telemetry - Continue with intravenous ceftriaxone  - Follow-up urine culture (Multiple species) and blood culture (contaminant)   Normocytic anemia, POA: -No reported bleeding or black or bloody stools.FOBT negative in the ED. -Follow-up CBC in a.m. - Monitoring on telemetry as above - Iron profile is unremarkable and folate is within normal limits    History of hypertension Hypotension -During recent admission had more issues with hypotension than hypertension.  Lasix  and metoprolol  were held and patient was started on midodrine . - Continue with home midodrine    Hyperlipidemia - Continue home rosuvastatin    Diabetes melitis type II - SSI   Hypothyroidism - Continue home Synthroid    Reactive airway disease - Continue as needed albuterol   Physical deconditioning,POA: Ordered PT/OT.   Spinal stenosis Chronic pain - Continue home gabapentin  and Norco  Disposition: She is from [employer] [employer].     Subjective: She said that she is in the hospital for her back pain which has been going on for years know. We spoke about the results of her right knee Xray as well. She said that she was living in a condo  by herself prior to this admission but doesn't think that she can live alone anymore.  Physical Exam: Vitals:   06/20/24 1959 06/21/24 0018 06/21/24 0504 06/21/24 0826  BP: 112/68 104/61 106/62 102/60  Pulse: 97 98 96 96  Resp: 18 17 18 18   Temp: 99.7 F (37.6 C) 98.6 F (37 C) 98.5 F (36.9 C) 98 F (36.7 C)  TempSrc:    Oral  SpO2: 95% 94% 95% 96%  Weight:      Height:       Constitutional: Appears alert and awake Eyes: PERRL, lids and conjunctivae normal ENMT: Mucous membranes are moist. Posterior pharynx clear of any exudate or lesions.Normal dentition.  Neck: normal, supple, no masses, no thyromegaly Respiratory: clear to auscultation bilaterally, no wheezing, no crackles. Normal respiratory effort. No accessory muscle use.  Cardiovascular: Regular rate and rhythm, no murmurs / rubs / gallops. No extremity edema. 2+ pedal pulses. No carotid bruits.  Abdomen: no tenderness, no masses palpated. No hepatosplenomegaly. Bowel sounds positive.  Musculoskeletal: no clubbing / cyanosis. No joint deformity upper and lower extremities. Good ROM, no contractures. Normal muscle tone.  Skin: no rashes, lesions, ulcers. No induration Neurologic: No obvious focal neurological deficits. Alert awake  Data Reviewed:  There are no new results to review at this time.  Family Communication: None at the bedside  Disposition: Status is: Observation The patient remains OBS appropriate and will d/c before 2 midnights.  Planned Discharge Destination: Skilled nursing facility    Time spent: 40 minutes  Author: Deliliah Room, MD 06/21/2024 10:35 AM  For on call review [email or web address]

## 2024-06-21 NOTE — Plan of Care (Signed)
   Problem: Education: Goal: Ability to describe self-care measures that may prevent or decrease complications (Diabetes Survival Skills Education) will improve Outcome: Progressing Goal: Individualized Educational Video(s) Outcome: Progressing

## 2024-06-22 DIAGNOSIS — G9340 Encephalopathy, unspecified: Secondary | ICD-10-CM | POA: Diagnosis not present

## 2024-06-22 LAB — GLUCOSE, CAPILLARY
Glucose-Capillary: 129 mg/dL — ABNORMAL HIGH (ref 70–99)
Glucose-Capillary: 161 mg/dL — ABNORMAL HIGH (ref 70–99)
Glucose-Capillary: 133 mg/dL — ABNORMAL HIGH (ref 70–99)
Glucose-Capillary: 112 mg/dL — ABNORMAL HIGH (ref 70–99)

## 2024-06-22 NOTE — Plan of Care (Signed)
  Problem: Education: Goal: Ability to describe self-care measures that may prevent or decrease complications (Diabetes Survival Skills Education) will improve Outcome: Progressing Goal: Individualized Educational Video(s) Outcome: Progressing   Problem: Coping: Goal: Ability to adjust to condition or change in health will improve Outcome: Progressing   Problem: Fluid Volume: Goal: Ability to maintain a balanced intake and output will improve Outcome: Progressing   Problem: Health Behavior/Discharge Planning: Goal: Ability to identify and utilize available resources and services will improve Outcome: Progressing Goal: Ability to manage health-related needs will improve Outcome: Progressing   Problem: Tissue Perfusion: Goal: Adequacy of tissue perfusion will improve Outcome: Progressing   Problem: Education: Goal: Knowledge of General Education information will improve Description: Including pain rating scale, medication(s)/side effects and non-pharmacologic comfort measures Outcome: Progressing   Problem: Clinical Measurements: Goal: Ability to maintain clinical measurements within normal limits will improve Outcome: Progressing Goal: Will remain free from infection Outcome: Progressing Goal: Diagnostic test results will improve Outcome: Progressing Goal: Respiratory complications will improve Outcome: Progressing Goal: Cardiovascular complication will be avoided Outcome: Progressing   Problem: Activity: Goal: Risk for activity intolerance will decrease Outcome: Progressing

## 2024-06-22 NOTE — Evaluation (Signed)
 Physical Therapy Evaluation Patient Details Name: Debra Watkins MRN: [id number] DOB: [birth year] Today's Date: 06/22/2024  History of Present Illness  Debra Watkins is a 68 yo female who presented from [facility] SN[facility] with AMS. Pt is being treated for acute metabolic encephalopathy in setting of possible UTI. PMH includes: recent admit 11/8 for AKI; HTN, HLD, DM II, hypothyroidism, anxiety, obesity (BMI 35), reactive airway disease, lumbar stenosis, and chronic pain.  Clinical Impression  PT evaluation complete.  Pt mildly confused.  Unable to tell me how long she has been at the SNF or when she last stood on her feet (she was able to stand with PT two person mod assist with RW earlier this month during a previous admission).  She was max assist for bed mobility and we used the maxi move total lift to get up to the chair due to decreased ability to move EOB and significant weakness demonstrated during bed level and seated MMT. Pt is appropriate to return to SNF at discharge and hopefully will be active with PT again there. PT will continue to follow acutely for safe mobility progression.      If plan is discharge home, recommend the following: Two people to help with walking and/or transfers;Two people to help with bathing/dressing/bathroom;Assistance with cooking/housework;Direct supervision/assist for medications management;Direct supervision/assist for financial management;Assist for transportation;Help with stairs or ramp for entrance;Supervision due to cognitive status   Can travel by private vehicle   No    Equipment Recommendations Wheelchair (measurements PT);Wheelchair cushion (measurements PT)  Recommendations for Other Services       Functional Status Assessment Patient has had a recent decline in their functional status and demonstrates the ability to make significant improvements in function in a reasonable and predictable amount of time.     Precautions /  Restrictions Precautions Precautions: Fall      Mobility  Bed Mobility Overal bed mobility: Needs Assistance Bed Mobility: Rolling, Supine to Sit, Sit to Supine Rolling: Max assist   Supine to sit: Max assist Sit to supine: Max assist   General bed mobility comments: Max assist for bed mobility, pt attempting to help move legs, pull on bedrail to assist at trunk and scoot once seated, however, significant assist needed to complete these tasks.    Transfers Overall transfer level: Needs assistance   Transfers: Bed to chair/wheelchair/BSC             General transfer comment: Pt tolerated lift OOB to chair.  She did have quite a bit of anxiety about it, but tolerated it well. Transfer via Lift Equipment: Maximove  Ambulation/Gait                  Stairs            Wheelchair Mobility     Tilt Bed    Modified Rankin (Stroke Patients Only)       Balance Overall balance assessment: Needs assistance Sitting-balance support: Feet supported, Bilateral upper extremity supported Sitting balance-Leahy Scale: Fair Sitting balance - Comments: close supervision on the EOB                                     Pertinent Vitals/Pain Pain Assessment Pain Assessment: Faces Faces Pain Scale: Hurts little more Pain Location: R knee today Pain Descriptors / Indicators: Discomfort Pain Intervention(s): Limited activity within patient's tolerance, Monitored during session, Repositioned    Home  Living Family/patient expects to be discharged to:: Skilled nursing facility                        Prior Function Prior Level of Function : Needs assist       Physical Assist : Mobility (physical) Mobility (physical): Bed mobility;Transfers;Gait;Stairs   Mobility Comments: Pt reports being bedbound, unknown amount of time, does not work with PT at [facility] and does not get up to the [facility] or EOB during the day.       Extremity/Trunk Assessment    Upper Extremity Assessment Upper Extremity Assessment: Defer to OT evaluation    Lower Extremity Assessment Lower Extremity Assessment: RLE deficits/detail;LLE deficits/detail RLE Deficits / Details: Right leg more impaired than left, pt reporting R knee pain (none on left today). PT 2+/5 throughout. RLE: Unable to fully assess due to pain RLE Sensation: WNL LLE Deficits / Details: left leg with normal sensation, strength slightly better against gravity than R, but still very weak 3-/5 throughout. LLE Sensation: WNL    Cervical / Trunk Assessment Cervical / Trunk Assessment: Other exceptions Cervical / Trunk Exceptions: large body habitus  Communication   Communication Communication: No apparent difficulties    Cognition Arousal: Alert Behavior During Therapy: Flat affect, Anxious   PT - Cognitive impairments: No family/caregiver present to determine baseline, Memory   Orientation impairments: Time                   PT - Cognition Comments: Pt not able to tell me how long she has been at [facility] or how long it has been since she stood or walked.         Cueing Cueing Techniques: Verbal cues, Tactile cues     General Comments      Exercises     Assessment/Plan    PT Assessment Patient needs continued PT services  PT Problem List Decreased strength;Decreased range of motion;Decreased activity tolerance;Decreased balance;Decreased mobility;Decreased knowledge of use of DME;Obesity;Pain       PT Treatment Interventions DME instruction;Gait training;Functional mobility training;Therapeutic activities;Therapeutic exercise;Balance training;Patient/family education    PT Goals (Current goals can be found in the Care Plan section)  Acute Rehab PT Goals Patient Stated Goal: to continue to work on getting up, ok with using the lift again to help if needed.    Frequency Min 2X/week     Co-evaluation               AM-PAC PT 6 Clicks Mobility  Outcome  Measure Help needed turning from your back to your side while in a flat bed without using bedrails?: A Lot Help needed moving from lying on your back to sitting on the side of a flat bed without using bedrails?: A Lot Help needed moving to and from a bed to a chair (including a wheelchair)?: A Lot Help needed standing up from a chair using your arms (e.g., wheelchair or bedside chair)?: Total Help needed to walk in hospital room?: Total Help needed climbing 3-5 steps with a railing? : Total 6 Click Score: 9    End of Session   Activity Tolerance: Patient limited by pain;Other (comment) (self limiting, anxiety) Patient left: in chair;with call bell/phone within reach;with chair alarm set Nurse Communication: Mobility status PT Visit Diagnosis: Unsteadiness on feet (R26.81);Muscle weakness (generalized) (M62.81);Difficulty in walking, not elsewhere classified (R26.2);Pain Pain - Right/Left: Right Pain - part of body: Knee    Time: 9147-9054 PT Time Calculation (min) (  ACUTE ONLY): 53 min   Charges:   PT Evaluation $PT Eval Moderate Complexity: 1 Mod PT Treatments $Therapeutic Activity: 38-52 mins PT General Charges $$ ACUTE PT VISIT: 1 Visit         Asberry HERO, PT, DPT  Acute Rehabilitation Secure chat is best for contact #[phone] office    06/22/2024, 1:01 PM

## 2024-06-22 NOTE — Progress Notes (Signed)
  Progress Note   Patient: Debra Watkins FMW:[id number] DOB: 08-11-1955 DOA: 06/19/2024     2 DOS: the patient was seen and examined on 06/22/2024   Brief hospital course:  68 y.o. female with medical history significant of hypertension, hyperlipidemia, diabetes, hypothyroidism, obesity, anxiety, reactive airway disease, spinal stenosis, chronic pain presented from [facility] with altered mental status.  She is currently being treated for acute metabolic encephalopathy in setting of possible acute uncomplicated urinary tract infection with intravenous antibiotics.  Assessment and Plan:  Acute metabolic encephalopathy, resolving, appears to be at baseline now Possible acute uncomplicated UTI -CT head negative in the ED. -Started on ceftriaxone  in the ED - Monitor on telemetry - Continue with intravenous ceftriaxone  - Follow-up urine culture (Multiple species) and blood culture (contaminant)   Normocytic anemia, POA: -No reported bleeding or black or bloody stools.FOBT negative in the ED. -Follow-up CBC closely - Monitoring on telemetry as above - Iron profile is unremarkable and folate is within normal limits    History of hypertension Hypotension -During recent admission had more issues with hypotension than hypertension.  Lasix  and metoprolol  were held and patient was started on midodrine . - Continue with home midodrine    Hyperlipidemia - Continue home rosuvastatin    Diabetes mellitus type II - SSI   Hypothyroidism - Continue home Synthroid    Reactive airway disease - Continue as needed albuterol   Physical deconditioning,POA: Ordered PT/OT.   Spinal stenosis Chronic pain - Continue home gabapentin  and Norco  Disposition: She is from [facility] but they are not taking her back. TOC consulted.     Subjective: She is complaining of being not able to sleep all night long. Denies any other complaints.  Physical Exam: Vitals:   06/21/24 0826 06/21/24  1150 06/21/24 1539 06/22/24 0817  BP: 102/60 (!) 104/59 (!) 101/59 120/63  Pulse: 96 98 96 95  Resp: 18 18 20 18   Temp: 98 F (36.7 C) 98.5 F (36.9 C) 98.8 F (37.1 C) 97.7 F (36.5 C)  TempSrc: Oral Oral Oral Oral  SpO2: 96% 93% 93% 91%  Weight:      Height:       Constitutional: Appears alert and awake Eyes: PERRL, lids and conjunctivae normal ENMT: Mucous membranes are moist. Posterior pharynx clear of any exudate or lesions.Normal dentition.  Neck: normal, supple, no masses, no thyromegaly Respiratory: clear to auscultation bilaterally, no wheezing, no crackles. Normal respiratory effort. No accessory muscle use.  Cardiovascular: Regular rate and rhythm, no murmurs / rubs / gallops. No extremity edema. 2+ pedal pulses. No carotid bruits.  Abdomen: no tenderness, no masses palpated. No hepatosplenomegaly. Bowel sounds positive.  Musculoskeletal: no clubbing / cyanosis. No joint deformity upper and lower extremities. Good ROM, no contractures. Normal muscle tone.  Skin: no rashes, lesions, ulcers. No induration Neurologic: No obvious focal neurological deficits. Alert awake  Data Reviewed:  There are no new results to review at this time.  Family Communication: None at the bedside  Disposition: Status is: Observation The patient remains OBS appropriate and will d/c before 2 midnights.  Planned Discharge Destination: Skilled nursing facility    Time spent: 40 minutes  Author: Deliliah Room, MD 06/22/2024 9:02 AM  For on call review [email or web address]

## 2024-06-23 DIAGNOSIS — G9340 Encephalopathy, unspecified: Secondary | ICD-10-CM | POA: Diagnosis not present

## 2024-06-23 LAB — GLUCOSE, CAPILLARY
Glucose-Capillary: 116 mg/dL — ABNORMAL HIGH (ref 70–99)
Glucose-Capillary: 148 mg/dL — ABNORMAL HIGH (ref 70–99)
Glucose-Capillary: 146 mg/dL — ABNORMAL HIGH (ref 70–99)
Glucose-Capillary: 129 mg/dL — ABNORMAL HIGH (ref 70–99)

## 2024-06-23 MED ORDER — GERHARDT'S BUTT CREAM
TOPICAL_CREAM | Freq: Two times a day (BID) | CUTANEOUS | Status: DC
  Filled 2024-06-23: qty 60

## 2024-06-23 NOTE — TOC Initial Note (Signed)
 Transition of Care [facility]) - Initial/Assessment Note    Patient Details  Name: Debra Watkins MRN: [id number] Date of Birth: [birth year]  Transition of Care [facility]) CM/SW Contact:    Gwenn Julien [place], [place] Phone Number: 06/23/2024, 10:13 AM  Clinical Narrative: PT rec for SNF noted. Pt currently confused. Spoke to pt's brother and SIL re SNF. Pt's brother agreeable to SNF and thinks pt will need to transition to LTC after rehab. Reviewed SNF placement process and answered questions. Will f/u with offers as available.   Julien Gwenn, MSW, LCSW [phone] (coverage)                    Expected Discharge Plan: Skilled Nursing Facility Barriers to Discharge: Continued Medical Work up, SNF Pending bed offer, Insurance Authorization   Patient Goals and CMS Choice     Choice offered to / list presented to : Sibling      Expected Discharge Plan and Services     Post Acute Care Choice: Skilled Nursing Facility Living arrangements for the past 2 months: Skilled Nursing Facility                                      Prior Living Arrangements/Services Living arrangements for the past 2 months: Skilled Nursing Facility Lives with:: Facility Resident Patient language and need for interpreter reviewed:: Yes        Need for Family Participation in Patient Care: Yes (Comment) Care giver support system in place?: No (comment)   Criminal Activity/Legal Involvement Pertinent to Current Situation/Hospitalization: No - Comment as needed  Activities of Daily Living   ADL Screening (condition at time of admission) Independently performs ADLs?: No Does the patient have a NEW difficulty with bathing/dressing/toileting/self-feeding that is expected to last >3 days?: Yes (Initiates electronic notice to provider for possible OT consult) Does the patient have a NEW difficulty with getting in/out of bed, walking, or climbing stairs that is expected to last >3 days?: Yes  (Initiates electronic notice to provider for possible PT consult) Does the patient have a NEW difficulty with communication that is expected to last >3 days?: No Is the patient deaf or have difficulty hearing?: No Does the patient have difficulty seeing, even when wearing glasses/contacts?: No Does the patient have difficulty concentrating, remembering, or making decisions?: No  Permission Sought/Granted Permission sought to share information with : Facility Industrial/product Designer granted to share information with : Yes, Verbal Permission Granted              Emotional Assessment       Orientation: : Fluctuating Orientation (Suspected and/or reported Sundowners), Oriented to Self Alcohol / Substance Use: Not Applicable Psych Involvement: No (comment)  Admission diagnosis:  Metabolic encephalopathy [G93.41] Acute encephalopathy [G93.40] Cystitis [N30.90] Anemia, unspecified type [D64.9] Sepsis without acute organ dysfunction, due to unspecified organism [facility]) [A41.9] Patient Active Problem List   Diagnosis Date Noted   Metabolic encephalopathy 06/20/2024   Acute encephalopathy 06/19/2024   UTI (urinary tract infection) 06/19/2024   Malnutrition of moderate degree 06/11/2024   AKI (acute kidney injury) 06/08/2024   Hyperlipidemia 08/31/2023   Encounter for pain management 08/30/2023   Pain management contract signed, 08/30/22 08/30/2022   Bilateral leg edema 05/31/2022   Requires assistance with activities of daily living (ADL) 06/06/2021   Chronic pain syndrome 05/28/2021   Diabetes (HCC) 07/02/2016   Essential hypertension, benign 06/29/2016  Lumbar spinal stenosis with chronic back pain 06/29/2016   Mild reactive airways disease 01/16/2015   DJD (degenerative joint disease) 05/07/2012   Osteopenia 05/07/2012   Vitamin D  deficiency 02/21/2009   Anemia 02/19/2009   Hypothyroidism following radioiodine therapy 03/10/2008   Morbid obesity (HCC) 12/13/2007    Anxiety state 12/13/2007   PCP:  Geofm Glade PARAS, MD Pharmacy:   [facility] [phone] - [place], [place] - [address] AT [facility] [address] [facility] [address] [place] [address] Phone: [phone] Fax: [phone]  [facility] - [place], [place] - [address] [facility] AT Portal to Registered [facility] [address] [place] [place] [address] Phone: [phone] Fax: [phone]  [facility] - [place], [place] - [address] [place] [address] [place] [address] [place] [address] Phone: [phone] Fax: [phone]     Social Drivers of Health (SDOH) Social History: SDOH Screenings   Food Insecurity: No Food Insecurity (06/20/2024)  Housing: Low Risk  (06/20/2024)  Transportation Needs: No Transportation Needs (06/20/2024)  Utilities: Not At Risk (06/20/2024)  Alcohol Screen: Low Risk  (12/27/2023)  Depression (PHQ2-9): Low Risk  (12/27/2023)  Financial Resource Strain: Low Risk  (12/27/2023)  Physical Activity: Inactive (12/27/2023)  Social Connections: Socially Isolated (06/20/2024)  Stress: No Stress Concern Present (12/27/2023)  Tobacco Use: Low Risk  (06/19/2024)  Health Literacy: Adequate Health Literacy (12/27/2023)   SDOH Interventions:     Readmission Risk Interventions     No data to display

## 2024-06-23 NOTE — Plan of Care (Signed)
  Problem: Education: Goal: Ability to describe self-care measures that may prevent or decrease complications (Diabetes Survival Skills Education) will improve Outcome: Progressing Goal: Individualized Educational Video(s) Outcome: Progressing   Problem: Coping: Goal: Ability to adjust to condition or change in health will improve Outcome: Progressing   Problem: Fluid Volume: Goal: Ability to maintain a balanced intake and output will improve Outcome: Progressing   Problem: Health Behavior/Discharge Planning: Goal: Ability to identify and utilize available resources and services will improve Outcome: Progressing Goal: Ability to manage health-related needs will improve Outcome: Progressing   Problem: Nutritional: Goal: Maintenance of adequate nutrition will improve Outcome: Progressing Goal: Progress toward achieving an optimal weight will improve Outcome: Progressing   Problem: Skin Integrity: Goal: Risk for impaired skin integrity will decrease Outcome: Progressing   Problem: Tissue Perfusion: Goal: Adequacy of tissue perfusion will improve Outcome: Progressing   Problem: Education: Goal: Knowledge of General Education information will improve Description: Including pain rating scale, medication(s)/side effects and non-pharmacologic comfort measures Outcome: Progressing

## 2024-06-23 NOTE — NC FL2 (Signed)
 [place]  MEDICAID FL2 LEVEL OF CARE FORM     IDENTIFICATION  Patient Name: Debra Watkins Birthdate: 03/04/1956 Sex: female Admission Date (Current Location): 06/19/2024  [facility] and [place] Number:  Producer, Television/film/video and Address:  [facility], [address], [place], [place] [address]      Provider Number: [id number]  Attending Physician Name and Address:  Dino Antu, MD  Relative Name and Phone Number:       Current Level of Care: Hospital Recommended Level of Care: Skilled Nursing Facility Prior Approval Number:    Date Approved/Denied:   PASRR Number: [id number]  Discharge Plan: SNF    Current Diagnoses: Patient Active Problem List   Diagnosis Date Noted   Metabolic encephalopathy 06/20/2024   Acute encephalopathy 06/19/2024   UTI (urinary tract infection) 06/19/2024   Malnutrition of moderate degree 06/11/2024   AKI (acute kidney injury) 06/08/2024   Hyperlipidemia 08/31/2023   Encounter for pain management 08/30/2023   Pain management contract signed, 08/30/22 08/30/2022   Bilateral leg edema 05/31/2022   Requires assistance with activities of daily living (ADL) 06/06/2021   Chronic pain syndrome 05/28/2021   Diabetes (HCC) 07/02/2016   Essential hypertension, benign 06/29/2016   Lumbar spinal stenosis with chronic back pain 06/29/2016   Mild reactive airways disease 01/16/2015   DJD (degenerative joint disease) 05/07/2012   Osteopenia 05/07/2012   Vitamin D  deficiency 02/21/2009   Anemia 02/19/2009   Hypothyroidism following radioiodine therapy 03/10/2008   Morbid obesity (HCC) 12/13/2007   Anxiety state 12/13/2007    Orientation RESPIRATION BLADDER Height & Weight     Self (fluctuating orientation)  Normal Incontinent Weight: 199 lb 15.3 oz (90.7 kg) Height:  5' 3 (160 cm)  BEHAVIORAL SYMPTOMS/MOOD NEUROLOGICAL BOWEL NUTRITION STATUS      Incontinent    AMBULATORY STATUS COMMUNICATION OF NEEDS Skin    Extensive Assist Verbally                         Personal Care Assistance Level of Assistance  Bathing, Feeding, Dressing Bathing Assistance: Maximum assistance Feeding assistance: Limited assistance Dressing Assistance: Maximum assistance     Functional Limitations Info  Sight, Hearing, Speech Sight Info: Adequate Hearing Info: Adequate Speech Info: Adequate    SPECIAL CARE FACTORS FREQUENCY  PT (By licensed PT), OT (By licensed OT)                    Contractures Contractures Info: Not present    Additional Factors Info  Code Status Code Status Info: FULL CODE             Current Medications (06/23/2024):  This is the current hospital active medication list Current Facility-Administered Medications  Medication Dose Route Frequency Provider Last Rate Last Admin   acetaminophen  (TYLENOL ) tablet 650 mg  650 mg Oral Q6H PRN Seena Marsa NOVAK, MD   650 mg at 06/21/24 2235   Or   acetaminophen  (TYLENOL ) suppository 650 mg  650 mg Rectal Q6H PRN Seena Marsa NOVAK, MD       albuterol  (PROVENTIL ) (2.5 MG/3ML) 0.083% nebulizer solution 2.5 mg  2.5 mg Nebulization Q6H PRN Seena Marsa NOVAK, MD       feeding supplement (ENSURE PLUS HIGH PROTEIN) liquid 237 mL  237 mL Oral BID BM Melvin, Alexander B, MD   237 mL at 06/22/24 1312   gabapentin  (NEURONTIN ) capsule 300 mg  300 mg Oral BID Melvin, Alexander B,  MD   300 mg at 06/23/24 1019   Gerhardt's butt cream   Topical BID Sundil, Subrina, MD   Given at 06/23/24 1019   HYDROcodone -acetaminophen  (NORCO/VICODIN) 5-325 MG per tablet 1 tablet  1 tablet Oral Q8H PRN Seena Marsa NOVAK, MD   1 tablet at 06/20/24 0931   insulin  aspart (novoLOG ) injection 0-15 Units  0-15 Units Subcutaneous TID WC Melvin, Alexander B, MD   2 Units at 06/22/24 1723   levothyroxine  (SYNTHROID ) tablet 112 mcg  112 mcg Oral Q0600 Melvin, Alexander B, MD   112 mcg at 06/23/24 0547   midodrine  (PROAMATINE ) tablet 5 mg  5 mg Oral TID WC Melvin,  Alexander B, MD   5 mg at 06/23/24 0809   polyethylene glycol (MIRALAX  / GLYCOLAX ) packet 17 g  17 g Oral Daily PRN Melvin, Alexander B, MD       rosuvastatin  (CRESTOR ) tablet 5 mg  5 mg Oral Once per day on Monday Wednesday Friday Seena Marsa NOVAK, MD   5 mg at 06/21/24 9076   sodium chloride  flush (NS) 0.9 % injection 3 mL  3 mL Intravenous Q12H Seena Marsa NOVAK, MD   3 mL at 06/22/24 2133     Discharge Medications: Please see discharge summary for a list of discharge medications.  Relevant Imaging Results:  Relevant Lab Results:   Additional Information SSN [id number]  Adley Mazurowski [place], [place]

## 2024-06-23 NOTE — Progress Notes (Signed)
  Progress Note   Patient: Debra Watkins FMW:[id number] DOB: 06/15/1956 DOA: 06/19/2024     3 DOS: the patient was seen and examined on 06/23/2024   Brief hospital course:  68 y.o. female with medical history significant of hypertension, hyperlipidemia, diabetes, hypothyroidism, obesity, anxiety, reactive airway disease, spinal stenosis, chronic pain presented from [facility] with altered mental status.  She is currently being treated for acute metabolic encephalopathy in setting of possible acute uncomplicated urinary tract infection with intravenous antibiotics.  Assessment and Plan:  Acute metabolic encephalopathy, resolving, appears to be at baseline now Possible acute uncomplicated UTI -CT head negative in the ED. -Started on ceftriaxone  in the ED - Monitor on telemetry - Discontinued intravenous ceftriaxone  on 11/23 as no evidence of fever, leukocytosis or UTI symptoms. - Follow-up urine culture (Multiple species) and blood culture (contaminant)   Normocytic anemia, POA: -No reported bleeding or black or bloody stools.FOBT negative in the ED. -Follow-up CBC closely - Monitoring on telemetry as above - Iron profile is unremarkable and folate is within normal limits    History of hypertension Hypotension -During recent admission had more issues with hypotension than hypertension.  Lasix  and metoprolol  were held and patient was started on midodrine . - Continue with home midodrine    Hyperlipidemia - Continue home rosuvastatin    Diabetes mellitus type II - SSI   Hypothyroidism - Continue home Synthroid    Reactive airway disease - Continue as needed albuterol   Physical deconditioning,POA: Ordered PT/OT.   Spinal stenosis Chronic pain - Continue home gabapentin  and Norco  Disposition: She is from [facility] but they are not taking her back. TOC consulted. PT evaluation done.     Subjective: She is complaining of being not able to sleep all night long.  Denies any other complaints.  Physical Exam: Vitals:   06/22/24 2013 06/23/24 0050 06/23/24 0351 06/23/24 0820  BP: 107/68 108/71 110/68 114/69  Pulse: 91 98 94 87  Resp: 20 20 18 18   Temp: 98 F (36.7 C) 98 F (36.7 C) 98 F (36.7 C) 97.6 F (36.4 C)  TempSrc: Oral Oral Oral Oral  SpO2: 94% 94% 94% 94%  Weight:      Height:       Constitutional: Appears alert and awake Eyes: PERRL, lids and conjunctivae normal ENMT: Mucous membranes are moist. Posterior pharynx clear of any exudate or lesions.Normal dentition.  Neck: normal, supple, no masses, no thyromegaly Respiratory: clear to auscultation bilaterally, no wheezing, no crackles. Normal respiratory effort. No accessory muscle use.  Cardiovascular: Regular rate and rhythm, no murmurs / rubs / gallops. No extremity edema. 2+ pedal pulses. No carotid bruits.  Abdomen: no tenderness, no masses palpated. No hepatosplenomegaly. Bowel sounds positive.  Musculoskeletal: no clubbing / cyanosis. No joint deformity upper and lower extremities. Good ROM, no contractures. Normal muscle tone.  Skin: no rashes, lesions, ulcers. No induration Neurologic: No obvious focal neurological deficits. Alert awake  Data Reviewed:  There are no new results to review at this time.  Family Communication: None at the bedside  Disposition: Status is: Observation The patient remains OBS appropriate and will d/c before 2 midnights.  Planned Discharge Destination: Skilled nursing facility    Time spent: 35 minutes  Author: Deliliah Room, MD 06/23/2024 9:00 AM  For on call review [email or web address]

## 2024-06-23 NOTE — Plan of Care (Signed)
  Problem: Fluid Volume: Goal: Ability to maintain a balanced intake and output will improve Outcome: Progressing   Problem: Metabolic: Goal: Ability to maintain appropriate glucose levels will improve Outcome: Progressing   Problem: Education: Goal: Knowledge of General Education information will improve Description: Including pain rating scale, medication(s)/side effects and non-pharmacologic comfort measures Outcome: Progressing   Problem: Clinical Measurements: Goal: Ability to maintain clinical measurements within normal limits will improve Outcome: Progressing   Problem: Activity: Goal: Risk for activity intolerance will decrease Outcome: Progressing   Problem: Coping: Goal: Level of anxiety will decrease Outcome: Progressing   Problem: Pain Managment: Goal: General experience of comfort will improve and/or be controlled Outcome: Progressing   Problem: Safety: Goal: Ability to remain free from injury will improve Outcome: Progressing   Problem: Skin Integrity: Goal: Risk for impaired skin integrity will decrease Outcome: Progressing

## 2024-06-24 DIAGNOSIS — G9340 Encephalopathy, unspecified: Secondary | ICD-10-CM | POA: Diagnosis not present

## 2024-06-24 LAB — GLUCOSE, CAPILLARY
Glucose-Capillary: 138 mg/dL — ABNORMAL HIGH (ref 70–99)
Glucose-Capillary: 135 mg/dL — ABNORMAL HIGH (ref 70–99)
Glucose-Capillary: 152 mg/dL — ABNORMAL HIGH (ref 70–99)
Glucose-Capillary: 152 mg/dL — ABNORMAL HIGH (ref 70–99)
Glucose-Capillary: 135 mg/dL — ABNORMAL HIGH (ref 70–99)

## 2024-06-24 LAB — CULTURE, BLOOD (ROUTINE X 2)
Culture: NO GROWTH
Special Requests: ADEQUATE

## 2024-06-24 MED ORDER — HYDROCORTISONE 0.5 % EX CREA
TOPICAL_CREAM | Freq: Two times a day (BID) | CUTANEOUS | Status: DC
  Administered 2024-06-25: 1 via TOPICAL
  Filled 2024-06-24: qty 28.35

## 2024-06-24 NOTE — Plan of Care (Signed)
  Problem: Metabolic: Goal: Ability to maintain appropriate glucose levels will improve Outcome: Progressing   Problem: Nutritional: Goal: Maintenance of adequate nutrition will improve Outcome: Progressing Goal: Progress toward achieving an optimal weight will improve Outcome: Progressing   Problem: Tissue Perfusion: Goal: Adequacy of tissue perfusion will improve Outcome: Progressing   Problem: Health Behavior/Discharge Planning: Goal: Ability to manage health-related needs will improve Outcome: Progressing   Problem: Clinical Measurements: Goal: Ability to maintain clinical measurements within normal limits will improve Outcome: Progressing Goal: Will remain free from infection Outcome: Progressing Goal: Diagnostic test results will improve Outcome: Progressing Goal: Respiratory complications will improve Outcome: Progressing Goal: Cardiovascular complication will be avoided Outcome: Progressing   Problem: Activity: Goal: Risk for activity intolerance will decrease Outcome: Progressing   Problem: Nutrition: Goal: Adequate nutrition will be maintained Outcome: Progressing   Problem: Coping: Goal: Level of anxiety will decrease Outcome: Progressing   Problem: Elimination: Goal: Will not experience complications related to bowel motility Outcome: Progressing Goal: Will not experience complications related to urinary retention Outcome: Progressing   Problem: Pain Managment: Goal: General experience of comfort will improve and/or be controlled Outcome: Progressing   Problem: Education: Goal: Knowledge of General Education information will improve Description: Including pain rating scale, medication(s)/side effects and non-pharmacologic comfort measures Outcome: Not Progressing

## 2024-06-24 NOTE — Progress Notes (Signed)
  Progress Note   Patient: Debra Watkins FMW:[id number] DOB: 09-21-1955 DOA: 06/19/2024     4 DOS: the patient was seen and examined on 06/24/2024   Brief hospital course:  68 y.o. female with medical history significant of hypertension, hyperlipidemia, diabetes, hypothyroidism, obesity, anxiety, reactive airway disease, spinal stenosis, chronic pain presented from [facility] with altered mental status.  She is currently being treated for acute metabolic encephalopathy in setting of possible acute uncomplicated urinary tract infection with intravenous antibiotics. Pending placement to SNF  Assessment and Plan:  Acute metabolic encephalopathy, resolving, appears to be at baseline now Possible acute uncomplicated UTI -CT head negative in the ED. -Started on ceftriaxone  in the ED - Monitor on telemetry - Discontinued intravenous ceftriaxone  on 11/23 as no evidence of fever, leukocytosis or UTI symptoms. - Follow-up urine culture (Multiple species) and blood culture (contaminant)   Normocytic anemia, POA: -No reported bleeding or black or bloody stools.FOBT negative in the ED. -Follow-up CBC closely - Monitoring on telemetry as above - Iron profile is unremarkable and folate is within normal limits    History of hypertension Hypotension -During recent admission had more issues with hypotension than hypertension.  Lasix  and metoprolol  were held and patient was started on midodrine . - Continue with home midodrine    Hyperlipidemia - Continue home rosuvastatin    Diabetes mellitus type II - SSI   Hypothyroidism - Continue home Synthroid    Reactive airway disease - Continue as needed albuterol   Physical deconditioning,POA: Ordered PT/OT.   Spinal stenosis Chronic pain - Continue home gabapentin  and Norco  Disposition: She is from [facility] but they are not taking her back. TOC consulted. PT evaluation done.     Subjective: She is complaining of back itching.  No other active complaints. I told her that we are waiting on a rehab place/SNF and insurance auth before she can be discharged.  Physical Exam: Vitals:   06/23/24 2002 06/23/24 2313 06/24/24 0407 06/24/24 0807  BP: (!) 106/57 110/65 136/71 111/60  Pulse: 92 92 92 94  Resp: 18 18 18 18   Temp: 97.9 F (36.6 C) 98 F (36.7 C) (!) 97.2 F (36.2 C) 98.7 F (37.1 C)  TempSrc: Oral Oral Oral Oral  SpO2: 93% 95% 96% 93%  Weight:      Height:       Constitutional: Appears alert and awake Eyes: PERRL, lids and conjunctivae normal ENMT: Mucous membranes are moist. Posterior pharynx clear of any exudate or lesions.Normal dentition.  Neck: normal, supple, no masses, no thyromegaly Respiratory: clear to auscultation bilaterally, no wheezing, no crackles. Normal respiratory effort. No accessory muscle use.  Cardiovascular: Regular rate and rhythm, no murmurs / rubs / gallops. No extremity edema. 2+ pedal pulses. No carotid bruits.  Abdomen: no tenderness, no masses palpated. No hepatosplenomegaly. Bowel sounds positive.  Musculoskeletal: no clubbing / cyanosis. No joint deformity upper and lower extremities. Good ROM, no contractures. Normal muscle tone.  Skin: no rashes, lesions, ulcers. No induration Neurologic: No obvious focal neurological deficits. Alert awake  Data Reviewed:  There are no new results to review at this time.  Family Communication: None at the bedside  Disposition: Status is: Observation The patient remains OBS appropriate and will d/c before 2 midnights.  Planned Discharge Destination: Skilled nursing facility    Time spent: 35 minutes  Author: Deliliah Room, MD 06/24/2024 9:00 AM  For on call review [email or web address]

## 2024-06-24 NOTE — Care Management Important Message (Signed)
 Important Message  Patient Details  Name: Debra Watkins MRN: [id number] Date of Birth: November 09, 1955   Important Message Given:  Yes - Medicare IM     Jennie Laneta Dragon 06/24/2024, 1:50 PM

## 2024-06-24 NOTE — TOC Progression Note (Signed)
 Transition of Care [facility]) - Progression Note    Patient Details  Name: Debra Watkins MRN: [id number] Date of Birth: [birth year]  [facility]) CM/SW Contact  Almarie CHRISTELLA [place], [place] Phone Number: 06/24/2024, 3:53 PM  Clinical Narrative:   CSW attempted to reach patient's brother to provide bed offers, left a voicemail requesting call back. CSW to follow.    Expected Discharge Plan: Skilled Nursing Facility Barriers to Discharge: Continued Medical Work up, SNF Pending bed offer, English As A Second Language Teacher               Expected Discharge Plan and Services     Post Acute Care Choice: Skilled Nursing Facility Living arrangements for the past 2 months: Skilled Nursing Facility                                       Social Drivers of Health (SDOH) Interventions SDOH Screenings   Food Insecurity: No Food Insecurity (06/20/2024)  Housing: Low Risk  (06/20/2024)  Transportation Needs: No Transportation Needs (06/20/2024)  Utilities: Not At Risk (06/20/2024)  Alcohol Screen: Low Risk  (12/27/2023)  Depression (PHQ2-9): Low Risk  (12/27/2023)  Financial Resource Strain: Low Risk  (12/27/2023)  Physical Activity: Inactive (12/27/2023)  Social Connections: Socially Isolated (06/20/2024)  Stress: No Stress Concern Present (12/27/2023)  Tobacco Use: Low Risk  (06/19/2024)  Health Literacy: Adequate Health Literacy (12/27/2023)    Readmission Risk Interventions     No data to display

## 2024-06-25 DIAGNOSIS — G9340 Encephalopathy, unspecified: Secondary | ICD-10-CM | POA: Diagnosis not present

## 2024-06-25 LAB — GLUCOSE, CAPILLARY
Glucose-Capillary: 129 mg/dL — ABNORMAL HIGH (ref 70–99)
Glucose-Capillary: 153 mg/dL — ABNORMAL HIGH (ref 70–99)
Glucose-Capillary: 134 mg/dL — ABNORMAL HIGH (ref 70–99)

## 2024-06-25 NOTE — Plan of Care (Signed)

## 2024-06-25 NOTE — Progress Notes (Signed)
 Physical Therapy Treatment Patient Details Name: Debra Watkins MRN: [id number] DOB: [birth year] Today's Date: 06/25/2024   History of Present Illness Debra Watkins is a 68 yo female who presented from [facility] with AMS. Pt is being treated for acute metabolic encephalopathy in setting of possible UTI. PMH includes: recent admit 11/8 for AKI; HTN, HLD, DM II, hypothyroidism, anxiety, obesity (BMI 35), reactive airway disease, lumbar stenosis, and chronic pain.    PT Comments  Pt received in supine and agreeable to PT session. Today, pt maxA for all bed mobility, requiring assist for both trunk and LE management. Pt attempted to move LE to EOB and push up into sitting during supine>sit, but ultimately required assist to accomplish and maintain sitting. Pt requiring maxA +2 for STS transfer with Stedy, and despite verbal and tactile cueing, was unable to fully come into standing. She had a BM during this, and returned to supine for pericare, where pt required maxA for rolling. Hand over hand guidance to handrails to assist were used, but pt unable to carry through to significantly assist in mobility. Continuing to recommend post-acute rehab <3hrs/day to improve strength, functional mobility, and balance. Acute PT to follow.    If plan is discharge home, recommend the following: Two people to help with walking and/or transfers;Two people to help with bathing/dressing/bathroom;Assistance with cooking/housework;Direct supervision/assist for medications management;Direct supervision/assist for financial management;Assist for transportation;Help with stairs or ramp for entrance;Supervision due to cognitive status   Can travel by private vehicle     No  Equipment Recommendations  Wheelchair (measurements PT);Wheelchair cushion (measurements PT)    Recommendations for Other Services       Precautions / Restrictions Precautions Precautions: Fall Recall of Precautions/Restrictions:  Intact Precaution/Restrictions Comments: watch hypotension Restrictions Weight Bearing Restrictions Per Provider Order: No     Mobility  Bed Mobility Overal bed mobility: Needs Assistance Bed Mobility: Rolling, Supine to Sit, Sit to Supine Rolling: Max assist   Supine to sit: Max assist Sit to supine: Max assist   General bed mobility comments: MaxA for all bed mobility. In supine>sit, pt attempting to move legs off EOB, but difficulty with initiation.    Transfers Overall transfer level: Needs assistance Equipment used: Ambulation equipment used Transfers: Sit to/from Stand Sit to Stand: +2 physical assistance, Max assist           General transfer comment: Performed STS from bed using Stedy, and pt unable to fully shift hips anteriorly and stand upright. Pt with BM on first STS, so returned to supine for pericare. Transfer via Lift Equipment: Stedy  Ambulation/Gait                   Stairs             Wheelchair Mobility     Tilt Bed    Modified Rankin (Stroke Patients Only)       Balance Overall balance assessment: Needs assistance Sitting-balance support: Feet supported, Bilateral upper extremity supported Sitting balance-Leahy Scale: Poor Sitting balance - Comments: Reliant on external support for EOB Postural control: Posterior lean, Right lateral lean Standing balance support: Bilateral upper extremity supported, During functional activity, Reliant on assistive device for balance Standing balance-Leahy Scale: Poor Standing balance comment: reliant on external assist                            Communication Communication Communication: No apparent difficulties  Cognition Arousal: Alert Behavior During  Therapy: Flat affect   PT - Cognitive impairments: No family/caregiver present to determine baseline, Memory, Initiation                       PT - Cognition Comments: Pt with difficulty initiating mobility with  verbla and tactile cueing. Following commands: Impaired Following commands impaired: Follows one step commands with increased time    Cueing Cueing Techniques: Verbal cues, Tactile cues  Exercises      General Comments General comments (skin integrity, edema, etc.): Pt with BM during STS, and unable to complete further STS due to this. Pt had difficulty even with hand over hand guidance to assist with rolling using handrails      Pertinent Vitals/Pain Pain Assessment Pain Assessment: Faces Faces Pain Scale: Hurts little more Pain Location: R knee today Pain Descriptors / Indicators: Discomfort Pain Intervention(s): Limited activity within patient's tolerance, Monitored during session    Home Living                          Prior Function            PT Goals (current goals can now be found in the care plan section) Acute Rehab PT Goals PT Goal Formulation: With patient Time For Goal Achievement: 07/09/24 Potential to Achieve Goals: Fair Progress towards PT goals: Progressing toward goals    Frequency    Min 2X/week      PT Plan      Co-evaluation              AM-PAC PT 6 Clicks Mobility   Outcome Measure  Help needed turning from your back to your side while in a flat bed without using bedrails?: A Lot Help needed moving from lying on your back to sitting on the side of a flat bed without using bedrails?: A Lot Help needed moving to and from a bed to a chair (including a wheelchair)?: Total Help needed standing up from a chair using your arms (e.g., wheelchair or bedside chair)?: Total Help needed to walk in hospital room?: Total Help needed climbing 3-5 steps with a railing? : Total 6 Click Score: 8    End of Session Equipment Utilized During Treatment: Gait belt Activity Tolerance: Patient tolerated treatment well;Other (comment) (BM) Patient left: in bed;with call bell/phone within reach;with bed alarm set;with nursing/sitter in  room Nurse Communication: Mobility status PT Visit Diagnosis: Unsteadiness on feet (R26.81);Muscle weakness (generalized) (M62.81);Difficulty in walking, not elsewhere classified (R26.2);Pain Pain - Right/Left: Right Pain - part of body: Knee     Time: 8888-8865 PT Time Calculation (min) (ACUTE ONLY): 23 min  Charges:    $Therapeutic Activity: 23-37 mins PT General Charges $$ ACUTE PT VISIT: 1 Visit                     Gearold Wainer, SPT    Mialee Weyman 06/25/2024, 12:37 PM

## 2024-06-25 NOTE — Progress Notes (Signed)
  Progress Note   Patient: Debra Watkins FMW:[id number] DOB: Apr 24, 1956 DOA: 06/19/2024     5 DOS: the patient was seen and examined on 06/25/2024   Brief hospital course:  68 y.o. female with medical history significant of hypertension, hyperlipidemia, diabetes, hypothyroidism, obesity, anxiety, reactive airway disease, spinal stenosis, chronic pain presented from [facility] with altered mental status.  She is currently being treated for acute metabolic encephalopathy in setting of possible acute uncomplicated urinary tract infection with intravenous antibiotics. Pending placement to SNF  Assessment and Plan:  Acute metabolic encephalopathy, resolving, appears to be at baseline now Possible acute uncomplicated UTI -CT head negative in the ED. -Started on ceftriaxone  in the ED - Monitor on telemetry - Discontinued intravenous ceftriaxone  on 11/23 as no evidence of fever, leukocytosis or UTI symptoms. - Follow-up urine culture (Multiple species) and blood culture (contaminant)   Normocytic anemia, POA: -No reported bleeding or black or bloody stools.FOBT negative in the ED. -Follow-up CBC closely - Monitoring on telemetry as above - Iron profile is unremarkable and folate is within normal limits    History of hypertension Hypotension -During recent admission had more issues with hypotension than hypertension.  Lasix  and metoprolol  were held and patient was started on midodrine . - Continue with home midodrine    Hyperlipidemia - Continue home rosuvastatin    Diabetes mellitus type II - SSI   Hypothyroidism - Continue home Synthroid    Reactive airway disease - Continue as needed albuterol   Physical deconditioning,POA: Ordered PT/OT.   Spinal stenosis Chronic pain - Continue home gabapentin  and Norco  Disposition: She is from [facility] but they are not taking her back. TOC consulted. PT evaluation done.     Subjective: Her only active complaint this  morning is that she was woken up at 5:30 AM and that interrupted her sleep.  Physical Exam: Vitals:   06/24/24 1954 06/24/24 2353 06/25/24 0336 06/25/24 0751  BP: 112/65 118/68 118/70 107/72  Pulse: 100 (!) 104 100 98  Resp: 16 18  18   Temp: 98 F (36.7 C) 98.2 F (36.8 C) 98.5 F (36.9 C) 98.1 F (36.7 C)  TempSrc: Oral Oral Oral   SpO2: 93% 94% 92% 94%  Weight:      Height:       Constitutional: Appears alert and awake Eyes: PERRL, lids and conjunctivae normal ENMT: Mucous membranes are moist. Posterior pharynx clear of any exudate or lesions.Normal dentition.  Neck: normal, supple, no masses, no thyromegaly Respiratory: clear to auscultation bilaterally, no wheezing, no crackles. Normal respiratory effort. No accessory muscle use.  Cardiovascular: Regular rate and rhythm, no murmurs / rubs / gallops. No extremity edema. 2+ pedal pulses. No carotid bruits.  Abdomen: no tenderness, no masses palpated. No hepatosplenomegaly. Bowel sounds positive.  Musculoskeletal: no clubbing / cyanosis. No joint deformity upper and lower extremities. Good ROM, no contractures. Normal muscle tone.  Skin: no rashes, lesions, ulcers. No induration Neurologic: No obvious focal neurological deficits. Alert awake  Data Reviewed:  There are no new results to review at this time.  Family Communication: None at the bedside  Disposition: Status is: Observation The patient remains OBS appropriate and will d/c before 2 midnights.  Planned Discharge Destination: Skilled nursing facility    Time spent: 35 minutes  Author: Deliliah Room, MD 06/25/2024 10:17 AM  For on call review [email or web address].

## 2024-06-25 NOTE — TOC Progression Note (Signed)
 Transition of Care [facility]) - Progression Note    Patient Details  Name: Debra Watkins MRN: [id number] Date of Birth: 06/27/1956  [facility]) CM/SW Contact  Almarie CHRISTELLA Goodie, [place] Phone Number: 06/25/2024, 2:23 PM  Clinical Narrative:   CSW spoke with patient's brother, Prentice, to provide bed offers for SNF. Prentice chose [facility]. CSW confirmed with [facility] that they had a bed available for the patient. CSW contacted CMA to request insurance authorization. CSW to follow.    Expected Discharge Plan: Skilled Nursing Facility Barriers to Discharge: Continued Medical Work up, SNF Pending bed offer, English As A Second Language Teacher               Expected Discharge Plan and Services     Post Acute Care Choice: Skilled Nursing Facility Living arrangements for the past 2 months: Skilled Nursing Facility                                       Social Drivers of Health (SDOH) Interventions SDOH Screenings   Food Insecurity: No Food Insecurity (06/20/2024)  Housing: Low Risk  (06/20/2024)  Transportation Needs: No Transportation Needs (06/20/2024)  Utilities: Not At Risk (06/20/2024)  Alcohol Screen: Low Risk  (12/27/2023)  Depression (PHQ2-9): Low Risk  (12/27/2023)  Financial Resource Strain: Low Risk  (12/27/2023)  Physical Activity: Inactive (12/27/2023)  Social Connections: Socially Isolated (06/20/2024)  Stress: No Stress Concern Present (12/27/2023)  Tobacco Use: Low Risk  (06/19/2024)  Health Literacy: Adequate Health Literacy (12/27/2023)    Readmission Risk Interventions     No data to display

## 2024-06-26 DIAGNOSIS — N3090 Cystitis, unspecified without hematuria: Secondary | ICD-10-CM | POA: Diagnosis not present

## 2024-06-26 DIAGNOSIS — G9340 Encephalopathy, unspecified: Secondary | ICD-10-CM | POA: Diagnosis not present

## 2024-06-26 DIAGNOSIS — D649 Anemia, unspecified: Secondary | ICD-10-CM | POA: Diagnosis not present

## 2024-06-26 LAB — GLUCOSE, CAPILLARY
Glucose-Capillary: 123 mg/dL — ABNORMAL HIGH (ref 70–99)
Glucose-Capillary: 159 mg/dL — ABNORMAL HIGH (ref 70–99)
Glucose-Capillary: 157 mg/dL — ABNORMAL HIGH (ref 70–99)

## 2024-06-26 MED ORDER — GABAPENTIN 300 MG PO CAPS: 300.0000 mg | ORAL_CAPSULE | Freq: Two times a day (BID) | ORAL | 0 refills | Status: AC

## 2024-06-26 MED ORDER — HYDROCODONE-ACETAMINOPHEN 5-325 MG PO TABS: 1.0000 | ORAL_TABLET | Freq: Three times a day (TID) | ORAL | 0 refills | Status: AC | PRN

## 2024-06-26 NOTE — TOC Transition Note (Signed)
 Transition of Care [facility]) - Discharge Note   Patient Details  Name: Debra Watkins MRN: [id number] Date of Birth: 1955-12-09  Transition of Care [facility]) CM/SW Contact:  Almarie CHRISTELLA Goodie, LCSW Phone Number: 06/26/2024, 1:42 PM   Clinical Narrative:   Patient received insurance approval, [facility] can accept today. MD updated, patient is stable. Discharge information sent to [facility], confirmed receipt. CSW updated brother, Prentice, he is in agreement. Transport arranged with [facility] for next available.  Nurse to call report to [phone], Room 127.    Final next level of care: Skilled Nursing Facility Barriers to Discharge: Barriers Resolved   Patient Goals and CMS Choice     Choice offered to / list presented to : Sibling      Discharge Placement              Patient [facility] bed at: [facility] Patient to be transferred to facility by: [facility] Name of family member notified: Brother Prentice Patient and family notified of of transfer: 06/26/24  Discharge Plan and Services Additional resources added to the After Visit Summary for       Post Acute Care Choice: Skilled Nursing Facility                               Social Drivers of Health (SDOH) Interventions SDOH Screenings   Food Insecurity: No Food Insecurity (06/20/2024)  Housing: Low Risk  (06/20/2024)  Transportation Needs: No Transportation Needs (06/20/2024)  Utilities: Not At Risk (06/20/2024)  Alcohol Screen: Low Risk  (12/27/2023)  Depression (PHQ2-9): Low Risk  (12/27/2023)  Financial Resource Strain: Low Risk  (12/27/2023)  Physical Activity: Inactive (12/27/2023)  Social Connections: Socially Isolated (06/20/2024)  Stress: No Stress Concern Present (12/27/2023)  Tobacco Use: Low Risk  (06/19/2024)  Health Literacy: Adequate Health Literacy (12/27/2023)     Readmission Risk Interventions     No data to display

## 2024-06-26 NOTE — Discharge Summary (Signed)
 Physician Discharge Summary   Patient: Debra Watkins MRN: [id number] DOB: Dec 04, 1955  Admit date:     06/19/2024  Discharge date: 06/26/24  Discharge Physician: Debra Watkins   PCP: Debra Glade PARAS, MD   Recommendations at discharge:    Follow up with PCP in 1-2 weeks  Discharge Diagnoses: Principal Problem:   Acute encephalopathy Active Problems:   Hypothyroidism following radioiodine therapy   Anemia   Anxiety state   Mild reactive airways disease   Essential hypertension, benign   Lumbar spinal stenosis with chronic back pain   Diabetes (HCC)   Hyperlipidemia   UTI (urinary tract infection)   Metabolic encephalopathy  Resolved Problems:   * No resolved hospital problems. *  Hospital Course: 68 y.o. female with medical history significant of hypertension, hyperlipidemia, diabetes, hypothyroidism, obesity, anxiety, reactive airway disease, spinal stenosis, chronic pain presented from [facility] with altered mental status.  She is currently being treated for acute metabolic encephalopathy in setting of possible acute uncomplicated urinary tract infection with intravenous antibiotics.  Assessment and Plan: Acute metabolic encephalopathy, resolving, appears to be at baseline now Ruled out acute uncomplicated UTI  -CT head negative in the ED. -Pt was given dose of ceftriaxone  in the ED - Discontinued intravenous ceftriaxone  on 11/23 as no evidence of fever, leukocytosis or UTI symptoms. - Follow-up urine culture (Multiple species) and blood culture (contaminant)   Normocytic anemia, POA: -No reported bleeding or black or bloody stools.FOBT negative in the ED. - Iron profile is unremarkable and folate is within normal limits    History of hypertension Hypotension -During recent admission had more issues with hypotension than hypertension.  Lasix  and metoprolol  were held and patient was started on midodrine . - Continue with home midodrine    Hyperlipidemia -  Continue home rosuvastatin    Diabetes mellitus type II - SSI   Hypothyroidism - Continue home Synthroid    Reactive airway disease - Continue as needed albuterol    Physical deconditioning,POA: Ordered PT/OT.   Spinal stenosis Chronic pain - Continue home gabapentin  and Norco       Consultants:  Procedures performed:   Disposition: Skilled nursing facility Diet recommendation:  Regular diet DISCHARGE MEDICATION: Allergies as of 06/26/2024       Reactions   Bee Venom Swelling   Tetanus Toxoid Hives   Penicillins Rash        Medication List     PAUSE taking these medications    metoprolol  tartrate 25 MG tablet Wait to take this until your doctor or other care provider tells you to start again. Commonly known as: LOPRESSOR  TAKE 1/2 TABLET(12.5 MG) BY MOUTH TWICE DAILY What changed: See the new instructions.   Rybelsus  3 MG Tabs Wait to take this until your doctor or other care provider tells you to start again. Generic drug: Semaglutide  Take 1 tablet (3 mg total) by mouth daily.       STOP taking these medications    furosemide  20 MG tablet Commonly known as: LASIX        TAKE these medications    acetaminophen  500 MG tablet Commonly known as: TYLENOL  Take 1,000 mg by mouth 2 (two) times daily as needed for headache or fever (pain).   albuterol  108 (90 Base) MCG/ACT inhaler Commonly known as: VENTOLIN  HFA Inhale 2 puffs into the lungs every 6 (six) hours as needed. What changed: reasons to take this   feeding supplement Liqd Take 237 mLs by mouth 2 (two) times daily between meals.   gabapentin  300  MG capsule Commonly known as: NEURONTIN  Take 1 capsule (300 mg total) by mouth 2 (two) times daily.   Gerhardt's butt cream Crea Apply 1 Application topically 3 (three) times daily.   HYDROcodone -acetaminophen  5-325 MG tablet Commonly known as: NORCO/VICODIN Take 1 tablet by mouth every 8 (eight) hours as needed.   insulin  aspart 100 UNIT/ML  injection Commonly known as: novoLOG  Inject 0-9 Units into the skin 3 (three) times daily with meals. What changed: additional instructions   levothyroxine  112 MCG tablet Commonly known as: SYNTHROID  TAKE 1 TABLET(112 MCG) BY MOUTH DAILY   midodrine  5 MG tablet Commonly known as: PROAMATINE  Take 1 tablet (5 mg total) by mouth 3 (three) times daily with meals.   Multivitamin Women 50+ Tabs Take 1 tablet by mouth daily.   polyethylene glycol 17 g packet Commonly known as: MIRALAX  / GLYCOLAX  Take 17 g by mouth daily as needed for mild constipation.   rosuvastatin  5 MG tablet Commonly known as: CRESTOR  TAKE 1 TABLET(5 MG) BY MOUTH 3 TIMES A WEEK   zinc oxide 11.3 % Crea cream Commonly known as: BALMEX Apply 1 Application topically in the morning, at noon, and at bedtime.        Contact information for after-discharge care     Destination     [facility] .   Service: Skilled Nursing Contact information: [address] [place]  [address] [phone]                    Discharge Exam: Debra Watkins   06/20/24 9191  Weight: 90.7 kg   General exam: Awake, laying in bed, in nad Respiratory system: Normal respiratory effort, no wheezing Cardiovascular system: regular rate, s1, s2 Gastrointestinal system: Soft, nondistended, positive BS Central nervous system: CN2-12 grossly intact, strength intact Extremities: Perfused, no clubbing Skin: Normal skin turgor, no notable skin lesions seen Psychiatry: Mood normal // no visual hallucinations   Condition at discharge: fair  The results of significant diagnostics from this hospitalization (including imaging, microbiology, ancillary and laboratory) are listed below for reference.   Imaging Studies: DG Knee Complete 4 Views Right Result Date: 06/20/2024 CLINICAL DATA:  Knee pain EXAM: RIGHT KNEE - COMPLETE 4+ VIEW COMPARISON:  Right knee radiograph dated 03/15/2011 FINDINGS: There are no  findings of fracture or dislocation. Small joint effusion. Mild tricompartmental degenerative changes of the knee. Diffuse osteopenia. Diffuse subcutaneous soft tissue reticulations. IMPRESSION: 1. No acute fracture or dislocation. 2. Small joint effusion. 3. Mild tricompartmental degenerative changes of the knee. Electronically Signed   By: Limin  Xu M.D.   On: 06/20/2024 10:03   DG Chest Port 1 View Result Date: 06/19/2024 CLINICAL DATA:  Possible sepsis.  Altered mental status. EXAM: PORTABLE CHEST 1 VIEW COMPARISON:  06/08/2024 FINDINGS: Lungs are adequately inflated with subtle patchy density over the left base which may be due to atelectasis or early infection. No effusion. Cardiomediastinal silhouette and remainder of the exam is unchanged. IMPRESSION: Subtle patchy density over the left base which may be due to atelectasis or early infection. Electronically Signed   By: Toribio Agreste M.D.   On: 06/19/2024 11:56   CT Head Wo Contrast Result Date: 06/19/2024 EXAM: CT HEAD WITHOUT CONTRAST 06/19/2024 10:55:00 AM TECHNIQUE: CT of the head was performed without the administration of intravenous contrast. Automated exposure control, iterative reconstruction, and/or weight based adjustment of the mA/kV was utilized to reduce the radiation dose to as low as reasonably achievable. COMPARISON: Head CT 06/08/2024. CLINICAL HISTORY: 68 year old  female with mental status change of unknown cause. FINDINGS: BRAIN AND VENTRICLES: No acute hemorrhage. No evidence of acute infarct. No hydrocephalus. No extra-axial collection. No mass effect or midline shift. Stable brain volume. Patchy and moderate for age periventricular white matter hypodensity is stable, most pronounced in the frontal horn regions. Calcified atherosclerosis at the skull base. No suspicious intracranial vascular hyperdensity. ORBITS: No acute abnormality. SINUSES: Paranasal sinuses, middle ears and mastoids remain well aerated. SOFT TISSUES AND  SKULL: No acute soft tissue abnormality. No skull fracture. IMPRESSION: 1. No acute intracranial abnormality. 2. Stable Moderate for age white matter changes, most commonly due to small vessel disease. Electronically signed by: Helayne Hurst MD 06/19/2024 11:13 AM EST RP Workstation: [id number]   CT Head Wo Contrast Result Date: 06/08/2024 EXAM: CT HEAD WITHOUT CONTRAST 06/08/2024 10:25:47 AM TECHNIQUE: CT of the head was performed without the administration of intravenous contrast. Automated exposure control, iterative reconstruction, and/or weight based adjustment of the mA/kV was utilized to reduce the radiation dose to as low as reasonably achievable. COMPARISON: None available. CLINICAL HISTORY: 68 year old female with shortness of breath, agitation, and altered mental status. FINDINGS: BRAIN AND VENTRICLES: No acute hemorrhage. No evidence of acute infarct. No hydrocephalus. No extra-axial collection. No mass effect or midline shift. Brain volume within normal limits for age. Moderate for age patchy periventricular white matter hypodensity, nonspecific but most commonly due to small vessel disease. Calcified atherosclerosis at the skull base. No suspicious intracranial vascular hyperdensity. ORBITS: No acute abnormality. SINUSES: Paranasal sinuses, middle ears and mastoids are clear. SOFT TISSUES AND SKULL: No acute soft tissue abnormality. No skull fracture. Mild hyperostosis of the calvarium, normal variant. IMPRESSION: 1. No acute intracranial abnormality. 2. Moderate for age white matter changes, most commonly due to small vessel disease. Electronically signed by: Helayne Hurst MD 06/08/2024 10:31 AM EST RP Workstation: [id number]   DG Chest Port 1 View Result Date: 06/08/2024 EXAM: 1 VIEW XRAY OF THE CHEST 06/08/2024 10:10:00 AM COMPARISON: None available. CLINICAL HISTORY: 06/25/13 FINDINGS: LUNGS AND PLEURA: No focal pulmonary opacity. No pulmonary edema. No pleural effusion. No pneumothorax. HEART  AND MEDIASTINUM: No acute abnormality of the cardiac and mediastinal silhouettes. BONES AND SOFT TISSUES: No acute fracture or destructive lesion. Multilevel thoracic osteophytosis. IMPRESSION: 1. No acute cardiopulmonary process. Electronically signed by: Waddell Calk MD 06/08/2024 10:28 AM EST RP Workstation: [id number]    Microbiology: Results for orders placed or performed during the hospital encounter of 06/19/24  Resp panel by RT-PCR (RSV, Flu A&B, Covid) Anterior Nasal Swab     Status: None   Collection Time: 06/19/24 10:37 AM   Specimen: Anterior Nasal Swab  Result Value Ref Range Status   SARS Coronavirus 2 by RT PCR NEGATIVE NEGATIVE Final   Influenza A by PCR NEGATIVE NEGATIVE Final   Influenza B by PCR NEGATIVE NEGATIVE Final    Comment: (NOTE) The Xpert Xpress SARS-CoV-2/FLU/RSV plus assay is intended as an aid in the diagnosis of influenza from Nasopharyngeal swab specimens and should not be used as a sole basis for treatment. Nasal washings and aspirates are unacceptable for Xpert Xpress SARS-CoV-2/FLU/RSV testing.  Fact Sheet for Patients: bloggercourse.com  Fact Sheet for Healthcare Providers: seriousbroker.it  This test is not yet approved or cleared by the [place]  FDA and has been authorized for detection and/or diagnosis of SARS-CoV-2 by FDA under an Emergency Use Authorization (EUA). This EUA will remain in effect (meaning this test can be used) for the duration of the COVID-19 declaration under Section  564(b)(1) of the Act, 21 U.S.C. section 360bbb-3(b)(1), unless the authorization is terminated or revoked.     Resp Syncytial Virus by PCR NEGATIVE NEGATIVE Final    Comment: (NOTE) Fact Sheet for Patients: bloggercourse.com  Fact Sheet for Healthcare Providers: seriousbroker.it  This test is not yet approved or cleared by the [place]  FDA  and has been authorized for detection and/or diagnosis of SARS-CoV-2 by FDA under an Emergency Use Authorization (EUA). This EUA will remain in effect (meaning this test can be used) for the duration of the COVID-19 declaration under Section 564(b)(1) of the Act, 21 U.S.C. section 360bbb-3(b)(1), unless the authorization is terminated or revoked.  Performed at [facility], [address] [place], [place] [address]   Blood Culture (routine x 2)     Status: Abnormal   Collection Time: 06/19/24 10:40 AM   Specimen: BLOOD  Result Value Ref Range Status   Specimen Description BLOOD SITE NOT SPECIFIED  Final   Special Requests   Final    BOTTLES DRAWN AEROBIC AND ANAEROBIC Blood Culture adequate volume   Culture  Setup Time   Final    GRAM POSITIVE COCCI IN CLUSTERS AEROBIC BOTTLE ONLY PHARMD Lisa P on [id number] @1635  by SM    Culture (A)  Final    STAPHYLOCOCCUS EPIDERMIDIS THE SIGNIFICANCE OF ISOLATING THIS ORGANISM FROM A SINGLE SET OF BLOOD CULTURES WHEN MULTIPLE SETS ARE DRAWN IS UNCERTAIN. PLEASE NOTIFY THE MICROBIOLOGY DEPARTMENT WITHIN ONE WEEK IF SPECIATION AND SENSITIVITIES ARE REQUIRED. Performed at [facility], [address], [place], [place] [address]    Report Status 06/21/2024 FINAL  Final  Urine Culture     Status: Abnormal   Collection Time: 06/19/24 10:40 AM   Specimen: Urine, Random  Result Value Ref Range Status   Specimen Description URINE, RANDOM  Final   Special Requests   Final    NONE Reflexed from [id number] Performed at [facility], [address], [place], [place] [address]    Culture MULTIPLE SPECIES PRESENT, SUGGEST RECOLLECTION (A)  Final   Report Status 06/21/2024 FINAL  Final  Blood Culture ID Panel (Reflexed)     Status: Abnormal   Collection Time: 06/19/24 10:40 AM  Result Value Ref Range Status   Enterococcus faecalis NOT DETECTED NOT DETECTED Final   Enterococcus Faecium NOT DETECTED NOT DETECTED Final   Listeria  monocytogenes NOT DETECTED NOT DETECTED Final   Staphylococcus species DETECTED (A) NOT DETECTED Final    Comment: CRITICAL RESULT CALLED TO, READ BACK BY AND VERIFIED WITH: PHARMD Lisa P on [id number] @1635  by SM    Staphylococcus aureus (BCID) NOT DETECTED NOT DETECTED Final   Staphylococcus epidermidis DETECTED (A) NOT DETECTED Final    Comment: Methicillin (oxacillin) resistant coagulase negative staphylococcus. Possible blood culture contaminant (unless isolated from more than one blood culture draw or clinical case suggests pathogenicity). No antibiotic treatment is indicated for blood  culture contaminants. CRITICAL RESULT CALLED TO, READ BACK BY AND VERIFIED WITH: PHARMD Lisa P on [id number] @1635  by SM    Staphylococcus lugdunensis NOT DETECTED NOT DETECTED Final   Streptococcus species NOT DETECTED NOT DETECTED Final   Streptococcus agalactiae NOT DETECTED NOT DETECTED Final   Streptococcus pneumoniae NOT DETECTED NOT DETECTED Final   Streptococcus pyogenes NOT DETECTED NOT DETECTED Final   A.calcoaceticus-baumannii NOT DETECTED NOT DETECTED Final   Bacteroides fragilis NOT DETECTED NOT DETECTED Final   Enterobacterales NOT DETECTED NOT DETECTED Final   Enterobacter cloacae complex  NOT DETECTED NOT DETECTED Final   Escherichia coli NOT DETECTED NOT DETECTED Final   Klebsiella aerogenes NOT DETECTED NOT DETECTED Final   Klebsiella oxytoca NOT DETECTED NOT DETECTED Final   Klebsiella pneumoniae NOT DETECTED NOT DETECTED Final   Proteus species NOT DETECTED NOT DETECTED Final   Salmonella species NOT DETECTED NOT DETECTED Final   Serratia marcescens NOT DETECTED NOT DETECTED Final   Haemophilus influenzae NOT DETECTED NOT DETECTED Final   Neisseria meningitidis NOT DETECTED NOT DETECTED Final   Pseudomonas aeruginosa NOT DETECTED NOT DETECTED Final   Stenotrophomonas maltophilia NOT DETECTED NOT DETECTED Final   Candida albicans NOT DETECTED NOT DETECTED Final   Candida auris NOT  DETECTED NOT DETECTED Final   Candida glabrata NOT DETECTED NOT DETECTED Final   Candida krusei NOT DETECTED NOT DETECTED Final   Candida parapsilosis NOT DETECTED NOT DETECTED Final   Candida tropicalis NOT DETECTED NOT DETECTED Final   Cryptococcus neoformans/gattii NOT DETECTED NOT DETECTED Final   Methicillin resistance mecA/C DETECTED (A) NOT DETECTED Final    Comment: CRITICAL RESULT CALLED TO, READ BACK BY AND VERIFIED WITH: PHARMD Olam SQUIBB on [id number] @1635  by SM Performed at [facility], [address], [place], [place] [address]   Blood Culture (routine x 2)     Status: None   Collection Time: 06/19/24  1:49 PM   Specimen: BLOOD RIGHT HAND  Result Value Ref Range Status   Specimen Description BLOOD RIGHT HAND  Final   Special Requests   Final    BOTTLES DRAWN AEROBIC ONLY Blood Culture adequate volume   Culture   Final    NO GROWTH 5 DAYS Performed at [facility], [address] [place], [place] [address]    Report Status 06/24/2024 FINAL  Final  MRSA Next Gen by PCR, Nasal     Status: None   Collection Time: 06/20/24  7:07 PM   Specimen: Nasal Mucosa; Nasal Swab  Result Value Ref Range Status   MRSA by PCR Next Gen NOT DETECTED NOT DETECTED Final    Comment: (NOTE) The GeneXpert MRSA Assay (FDA approved for NASAL specimens only), is one component of a comprehensive MRSA colonization surveillance program. It is not intended to diagnose MRSA infection nor to guide or monitor treatment for MRSA infections. Test performance is not FDA approved in patients less than 31 years old. Performed at [facility], [address] [place], [place] [address]     Labs: CBC: Recent Labs  Lab 06/19/24 1349 06/20/24 0249  WBC 6.4 5.6  HGB 9.3* 9.6*  HCT 27.8* 29.2*  MCV 90.6 90.7  PLT 237 236   Basic Metabolic Panel: Recent Labs  Lab 06/20/24 0249  NA 133*  K 3.6  CL 95*  CO2 25  GLUCOSE 154*  BUN 16  CREATININE 1.07*  CALCIUM  8.4*   Liver  Function Tests: Recent Labs  Lab 06/20/24 0249  AST 50*  ALT 43  ALKPHOS 80  BILITOT 0.6  PROT 6.2*  ALBUMIN 2.7*   CBG: Recent Labs  Lab 06/25/24 0612 06/25/24 1137 06/25/24 1557 06/26/24 0542 06/26/24 1152  GLUCAP 129* 153* 134* 123* 159*    Discharge time spent: less than 30 minutes.  Signed: Garnette Pelt, MD [facility] 06/26/2024

## 2024-06-26 NOTE — Progress Notes (Signed)
 Occupational Therapy Treatment Patient Details Name: Debra Watkins MRN: [id number] DOB: 1955-12-23 Today's Date: 06/26/2024   History of present illness Debra Watkins is a 68 yo female who presented from [facility] with AMS. Pt is being treated for acute metabolic encephalopathy in setting of possible UTI. PMH includes: recent admit 11/8 for AKI; HTN, HLD, DM II, hypothyroidism, anxiety, obesity (BMI 35), reactive airway disease, lumbar stenosis, and chronic pain.   OT comments  Patient received in supine and agreeable to OT session. Patient was instructed in bed mobility and required max assist to get to EOB due to assistance needed with BLEs and trunk. Patient able to maintain balance seated on EOB with BUE support but required min to mod assist during grooming tasks due to posterior leaning. Patient performed BUE strengthening exercises with level one therapy band to address UE strengthening and balance. Patient was max assist to return to supine.  Patient will benefit from continued inpatient follow up therapy, <3 hours/day.  Acute OT to continue to follow to address established goals to facilitate DC to next venue of care.        If plan is discharge home, recommend the following:  Two people to help with walking and/or transfers;A lot of help with bathing/dressing/bathroom;Assistance with cooking/housework;Direct supervision/assist for medications management;Direct supervision/assist for financial management;Assist for transportation;Help with stairs or ramp for entrance;Supervision due to cognitive status   Equipment Recommendations  Other (comment) (defer)    Recommendations for Other Services      Precautions / Restrictions Precautions Precautions: Fall Recall of Precautions/Restrictions: Intact Precaution/Restrictions Comments: watch hypotension Restrictions Weight Bearing Restrictions Per Provider Order: No       Mobility Bed Mobility Overal bed mobility:  Needs Assistance Bed Mobility: Supine to Sit, Sit to Supine     Supine to sit: Max assist Sit to supine: Max assist   General bed mobility comments: patient attempting to move BLE to EOB but required max assist to complete and assistance for raising trunk    Transfers Overall transfer level: Needs assistance Equipment used: None              Lateral/Scoot Transfers: Min assist General transfer comment: min assist for lateral scooting towards HOB     Balance Overall balance assessment: Needs assistance Sitting-balance support: Feet supported, Bilateral upper extremity supported Sitting balance-Leahy Scale: Poor Sitting balance - Comments: supervision with BUE supported, min to mod assist during grooming tasks due to posterior bias Postural control: Posterior lean                                 ADL either performed or assessed with clinical judgement   ADL Overall ADL's : Needs assistance/impaired     Grooming: Wash/dry hands;Wash/dry face;Oral care;Minimal assistance;Sitting Grooming Details (indicate cue type and reason): min assist for sitting balance                                    Extremity/Trunk Assessment              Vision       Perception     Praxis     Communication Communication Communication: No apparent difficulties   Cognition Arousal: Alert Behavior During Therapy: Flat affect Cognition: Cognition impaired   Orientation impairments: Situation Awareness: Online awareness impaired Memory impairment (select all impairments): Short-term memory,  Working memory Attention impairment (select first level of impairment): Sustained attention Executive functioning impairment (select all impairments): Initiation, Organization, Reasoning, Sequencing, Problem solving                   Following commands: Impaired Following commands impaired: Follows one step commands with increased time      Cueing    Cueing Techniques: Verbal cues, Tactile cues  Exercises Exercises: General Upper Extremity General Exercises - Upper Extremity Shoulder Horizontal ABduction: AROM, Strengthening, Both, 10 reps, Seated, Theraband Theraband Level (Shoulder Horizontal Abduction): Level 1 (Yellow) Elbow Flexion: AROM, Strengthening, Both, 10 reps, Seated, Theraband Theraband Level (Elbow Flexion): Level 1 (Yellow) Elbow Extension: AROM, Strengthening, Both, 10 reps, Seated, Theraband Theraband Level (Elbow Extension): Level 1 (Yellow)    Shoulder Instructions       General Comments      Pertinent Vitals/ Pain       Pain Assessment Pain Assessment: No/denies pain  Home Living                                          Prior Functioning/Environment              Frequency  Min 2X/week        Progress Toward Goals  OT Goals(current goals can now be found in the care plan section)  Progress towards OT goals: Progressing toward goals  Acute Rehab OT Goals Patient Stated Goal: to get better OT Goal Formulation: With patient Time For Goal Achievement: 07/05/24 Potential to Achieve Goals: Fair ADL Goals Pt Will Perform Lower Body Bathing: with min assist;sitting/lateral leans;sit to/from stand Pt Will Perform Lower Body Dressing: with min assist;sitting/lateral leans;sit to/from stand Pt Will Transfer to Toilet: with min assist;stand pivot transfer;bedside commode Pt Will Perform Toileting - Clothing Manipulation and hygiene: with min assist;sitting/lateral leans;sit to/from stand Pt/caregiver will Perform Home Exercise Program: Increased ROM;Both right and left upper extremity;With written HEP provided;With theraband;With minimal assist Additional ADL Goal #1: Pt will complete bed mobility with min A Additional ADL Goal #2: Pt will tolerate ADLs on the EOB with superivison A for balance  Plan      Co-evaluation                 AM-PAC OT 6 Clicks Daily Activity      Outcome Measure   Help from another person eating meals?: A Little Help from another person taking care of personal grooming?: A Little Help from another person toileting, which includes using toliet, bedpan, or urinal?: Total Help from another person bathing (including washing, rinsing, drying)?: A Lot Help from another person to put on and taking off regular upper body clothing?: A Little Help from another person to put on and taking off regular lower body clothing?: Total 6 Click Score: 13    End of Session    OT Visit Diagnosis: Unsteadiness on feet (R26.81);Other abnormalities of gait and mobility (R26.89);Muscle weakness (generalized) (M62.81);Other symptoms and signs involving cognitive function;Pain   Activity Tolerance Patient tolerated treatment well   Patient Left in bed;with call bell/phone within reach;with bed alarm set   Nurse Communication Mobility status        Time: 8741-8676 OT Time Calculation (min): 25 min  Charges: OT General Charges $OT Visit: 1 Visit OT Treatments $Self Care/Home Management : 8-22 mins $Therapeutic Exercise: 8-22 mins  Dick Laine, OTA Acute Rehabilitation Services  Office [phone]   Jeb LITTIE Laine 06/26/2024, 2:00 PM

## 2024-06-26 NOTE — Progress Notes (Signed)
 Patient wheeled off unit. Paper prescription did not make it in AVS packet called RN Tammy at Facility Prescription faxed to the Pharmacy the facility use. Number provided at [phone]

## 2024-06-26 NOTE — Hospital Course (Signed)
 68 y.o. female with medical history significant of hypertension, hyperlipidemia, diabetes, hypothyroidism, obesity, anxiety, reactive airway disease, spinal stenosis, chronic pain presented from [facility] with altered mental status.  She is currently being treated for acute metabolic encephalopathy in setting of possible acute uncomplicated urinary tract infection with intravenous antibiotics.

## 2024-06-26 NOTE — TOC Progression Note (Signed)
 Transition of Care [facility]) - Progression Note    Patient Details  Name: Debra Watkins MRN: [id number] Date of Birth: 04-24-1956  Transition of Care [facility]) CM/SW Contact  Almarie CHRISTELLA Goodie, [place] Phone Number: 06/26/2024, 9:50 AM  Clinical Narrative:   CSW checked status of insurance authorization, it remains pending this morning. CSW to follow.    Expected Discharge Plan: Skilled Nursing Facility Barriers to Discharge: Continued Medical Work up, SNF Pending bed offer, English As A Second Language Teacher               Expected Discharge Plan and Services     Post Acute Care Choice: Skilled Nursing Facility Living arrangements for the past 2 months: Skilled Nursing Facility                                       Social Drivers of Health (SDOH) Interventions SDOH Screenings   Food Insecurity: No Food Insecurity (06/20/2024)  Housing: Low Risk  (06/20/2024)  Transportation Needs: No Transportation Needs (06/20/2024)  Utilities: Not At Risk (06/20/2024)  Alcohol Screen: Low Risk  (12/27/2023)  Depression (PHQ2-9): Low Risk  (12/27/2023)  Financial Resource Strain: Low Risk  (12/27/2023)  Physical Activity: Inactive (12/27/2023)  Social Connections: Socially Isolated (06/20/2024)  Stress: No Stress Concern Present (12/27/2023)  Tobacco Use: Low Risk  (06/19/2024)  Health Literacy: Adequate Health Literacy (12/27/2023)    Readmission Risk Interventions     No data to display

## 2024-06-26 NOTE — Progress Notes (Signed)
 Report called to Tammy at [facility].

## 2024-06-27 DIAGNOSIS — E1169 Type 2 diabetes mellitus with other specified complication: Secondary | ICD-10-CM | POA: Diagnosis not present

## 2024-06-27 DIAGNOSIS — E039 Hypothyroidism, unspecified: Secondary | ICD-10-CM | POA: Diagnosis not present

## 2024-06-27 DIAGNOSIS — J45909 Unspecified asthma, uncomplicated: Secondary | ICD-10-CM | POA: Diagnosis not present

## 2024-06-27 DIAGNOSIS — D649 Anemia, unspecified: Secondary | ICD-10-CM | POA: Diagnosis not present

## 2024-06-27 DIAGNOSIS — G9341 Metabolic encephalopathy: Secondary | ICD-10-CM | POA: Diagnosis not present

## 2024-06-27 DIAGNOSIS — F4321 Adjustment disorder with depressed mood: Secondary | ICD-10-CM | POA: Diagnosis not present

## 2024-06-27 DIAGNOSIS — E785 Hyperlipidemia, unspecified: Secondary | ICD-10-CM | POA: Diagnosis not present

## 2024-06-27 DIAGNOSIS — I10 Essential (primary) hypertension: Secondary | ICD-10-CM | POA: Diagnosis not present

## 2024-06-27 DIAGNOSIS — I959 Hypotension, unspecified: Secondary | ICD-10-CM | POA: Diagnosis not present

## 2024-06-28 DIAGNOSIS — R531 Weakness: Secondary | ICD-10-CM | POA: Diagnosis not present

## 2024-06-28 DIAGNOSIS — F419 Anxiety disorder, unspecified: Secondary | ICD-10-CM | POA: Diagnosis not present

## 2024-06-28 DIAGNOSIS — Z7409 Other reduced mobility: Secondary | ICD-10-CM | POA: Diagnosis not present

## 2024-06-28 DIAGNOSIS — G8929 Other chronic pain: Secondary | ICD-10-CM | POA: Diagnosis not present

## 2024-06-28 DIAGNOSIS — F32A Depression, unspecified: Secondary | ICD-10-CM | POA: Diagnosis not present

## 2024-06-28 DIAGNOSIS — R4182 Altered mental status, unspecified: Secondary | ICD-10-CM | POA: Diagnosis not present

## 2024-06-28 DIAGNOSIS — M48061 Spinal stenosis, lumbar region without neurogenic claudication: Secondary | ICD-10-CM | POA: Diagnosis not present

## 2024-06-29 DIAGNOSIS — R4182 Altered mental status, unspecified: Secondary | ICD-10-CM | POA: Diagnosis not present

## 2024-06-29 DIAGNOSIS — R531 Weakness: Secondary | ICD-10-CM | POA: Diagnosis not present

## 2024-06-29 DIAGNOSIS — F419 Anxiety disorder, unspecified: Secondary | ICD-10-CM | POA: Diagnosis not present

## 2024-06-29 DIAGNOSIS — F32A Depression, unspecified: Secondary | ICD-10-CM | POA: Diagnosis not present

## 2024-06-29 DIAGNOSIS — R5381 Other malaise: Secondary | ICD-10-CM | POA: Diagnosis not present

## 2024-07-02 DIAGNOSIS — D649 Anemia, unspecified: Secondary | ICD-10-CM | POA: Diagnosis not present

## 2024-07-02 DIAGNOSIS — G8929 Other chronic pain: Secondary | ICD-10-CM | POA: Diagnosis not present

## 2024-07-02 DIAGNOSIS — Z7409 Other reduced mobility: Secondary | ICD-10-CM | POA: Diagnosis not present

## 2024-07-02 DIAGNOSIS — R531 Weakness: Secondary | ICD-10-CM | POA: Diagnosis not present

## 2024-07-02 DIAGNOSIS — E46 Unspecified protein-calorie malnutrition: Secondary | ICD-10-CM | POA: Diagnosis not present

## 2024-07-02 DIAGNOSIS — E1140 Type 2 diabetes mellitus with diabetic neuropathy, unspecified: Secondary | ICD-10-CM | POA: Diagnosis not present

## 2024-07-02 DIAGNOSIS — R5381 Other malaise: Secondary | ICD-10-CM | POA: Diagnosis not present

## 2024-07-02 DIAGNOSIS — E559 Vitamin D deficiency, unspecified: Secondary | ICD-10-CM | POA: Diagnosis not present

## 2024-07-03 DIAGNOSIS — G9341 Metabolic encephalopathy: Secondary | ICD-10-CM | POA: Diagnosis not present

## 2024-07-03 DIAGNOSIS — N390 Urinary tract infection, site not specified: Secondary | ICD-10-CM | POA: Diagnosis not present

## 2024-07-03 DIAGNOSIS — E1169 Type 2 diabetes mellitus with other specified complication: Secondary | ICD-10-CM | POA: Diagnosis not present

## 2024-07-03 DIAGNOSIS — E039 Hypothyroidism, unspecified: Secondary | ICD-10-CM | POA: Diagnosis not present

## 2024-07-04 DIAGNOSIS — R531 Weakness: Secondary | ICD-10-CM | POA: Diagnosis not present

## 2024-07-04 DIAGNOSIS — Z789 Other specified health status: Secondary | ICD-10-CM | POA: Diagnosis not present

## 2024-07-04 DIAGNOSIS — F411 Generalized anxiety disorder: Secondary | ICD-10-CM | POA: Diagnosis not present

## 2024-07-04 DIAGNOSIS — R4189 Other symptoms and signs involving cognitive functions and awareness: Secondary | ICD-10-CM | POA: Diagnosis not present

## 2024-07-04 DIAGNOSIS — R748 Abnormal levels of other serum enzymes: Secondary | ICD-10-CM | POA: Diagnosis not present

## 2024-07-04 DIAGNOSIS — F5105 Insomnia due to other mental disorder: Secondary | ICD-10-CM | POA: Diagnosis not present

## 2024-07-04 DIAGNOSIS — R5381 Other malaise: Secondary | ICD-10-CM | POA: Diagnosis not present

## 2024-07-04 DIAGNOSIS — Z7409 Other reduced mobility: Secondary | ICD-10-CM | POA: Diagnosis not present

## 2024-07-04 DIAGNOSIS — D649 Anemia, unspecified: Secondary | ICD-10-CM | POA: Diagnosis not present

## 2024-07-07 DIAGNOSIS — R4189 Other symptoms and signs involving cognitive functions and awareness: Secondary | ICD-10-CM | POA: Diagnosis not present

## 2024-07-07 DIAGNOSIS — R638 Other symptoms and signs concerning food and fluid intake: Secondary | ICD-10-CM | POA: Diagnosis not present

## 2024-07-07 DIAGNOSIS — R4182 Altered mental status, unspecified: Secondary | ICD-10-CM | POA: Diagnosis not present

## 2024-07-07 DIAGNOSIS — R5381 Other malaise: Secondary | ICD-10-CM | POA: Diagnosis not present

## 2024-07-08 DIAGNOSIS — R4182 Altered mental status, unspecified: Secondary | ICD-10-CM | POA: Diagnosis not present

## 2024-07-08 DIAGNOSIS — R4189 Other symptoms and signs involving cognitive functions and awareness: Secondary | ICD-10-CM | POA: Diagnosis not present

## 2024-07-08 DIAGNOSIS — R638 Other symptoms and signs concerning food and fluid intake: Secondary | ICD-10-CM | POA: Diagnosis not present

## 2024-07-08 DIAGNOSIS — R5381 Other malaise: Secondary | ICD-10-CM | POA: Diagnosis not present

## 2024-07-11 DIAGNOSIS — R5381 Other malaise: Secondary | ICD-10-CM | POA: Diagnosis not present

## 2024-07-11 DIAGNOSIS — R748 Abnormal levels of other serum enzymes: Secondary | ICD-10-CM | POA: Diagnosis not present

## 2024-07-11 DIAGNOSIS — R4189 Other symptoms and signs involving cognitive functions and awareness: Secondary | ICD-10-CM | POA: Diagnosis not present

## 2024-12-30 ENCOUNTER — Ambulatory Visit
